# Patient Record
Sex: Male | Born: 1945 | Race: White | Hispanic: No | Marital: Married | Smoking: Never smoker
Health system: Southern US, Community
[De-identification: ages and names within clinical notes are randomized; demographics above are authoritative.]

## PROBLEM LIST (undated history)

## (undated) DIAGNOSIS — Z87442 Personal history of urinary calculi: Secondary | ICD-10-CM

## (undated) DIAGNOSIS — G4700 Insomnia, unspecified: Secondary | ICD-10-CM

## (undated) DIAGNOSIS — I10 Essential (primary) hypertension: Secondary | ICD-10-CM

## (undated) DIAGNOSIS — I779 Disorder of arteries and arterioles, unspecified: Secondary | ICD-10-CM

## (undated) DIAGNOSIS — F32A Depression, unspecified: Secondary | ICD-10-CM

## (undated) DIAGNOSIS — F329 Major depressive disorder, single episode, unspecified: Secondary | ICD-10-CM

## (undated) DIAGNOSIS — H3530 Unspecified macular degeneration: Secondary | ICD-10-CM

## (undated) DIAGNOSIS — Z789 Other specified health status: Secondary | ICD-10-CM

## (undated) DIAGNOSIS — I6789 Other cerebrovascular disease: Secondary | ICD-10-CM

## (undated) DIAGNOSIS — N400 Enlarged prostate without lower urinary tract symptoms: Secondary | ICD-10-CM

## (undated) DIAGNOSIS — H269 Unspecified cataract: Secondary | ICD-10-CM

## (undated) DIAGNOSIS — R490 Dysphonia: Secondary | ICD-10-CM

## (undated) DIAGNOSIS — I700 Atherosclerosis of aorta: Secondary | ICD-10-CM

## (undated) DIAGNOSIS — J383 Other diseases of vocal cords: Secondary | ICD-10-CM

## (undated) DIAGNOSIS — M19071 Primary osteoarthritis, right ankle and foot: Secondary | ICD-10-CM

## (undated) DIAGNOSIS — F419 Anxiety disorder, unspecified: Secondary | ICD-10-CM

## (undated) DIAGNOSIS — K219 Gastro-esophageal reflux disease without esophagitis: Secondary | ICD-10-CM

## (undated) DIAGNOSIS — N200 Calculus of kidney: Secondary | ICD-10-CM

## (undated) DIAGNOSIS — I2510 Atherosclerotic heart disease of native coronary artery without angina pectoris: Secondary | ICD-10-CM

## (undated) DIAGNOSIS — G4730 Sleep apnea, unspecified: Secondary | ICD-10-CM

## (undated) DIAGNOSIS — I4510 Unspecified right bundle-branch block: Secondary | ICD-10-CM

## (undated) DIAGNOSIS — F3181 Bipolar II disorder: Secondary | ICD-10-CM

## (undated) DIAGNOSIS — G629 Polyneuropathy, unspecified: Secondary | ICD-10-CM

## (undated) DIAGNOSIS — K4090 Unilateral inguinal hernia, without obstruction or gangrene, not specified as recurrent: Secondary | ICD-10-CM

## (undated) DIAGNOSIS — G4733 Obstructive sleep apnea (adult) (pediatric): Secondary | ICD-10-CM

## (undated) DIAGNOSIS — M1990 Unspecified osteoarthritis, unspecified site: Secondary | ICD-10-CM

## (undated) DIAGNOSIS — E039 Hypothyroidism, unspecified: Secondary | ICD-10-CM

## (undated) HISTORY — PX: EYE SURGERY: SHX253

## (undated) HISTORY — PX: KNEE SURGERY: SHX244

## (undated) HISTORY — DX: Unspecified macular degeneration: H35.30

## (undated) HISTORY — PX: HERNIA REPAIR: SHX51

## (undated) HISTORY — DX: Polyneuropathy, unspecified: G62.9

## (undated) HISTORY — DX: Unilateral inguinal hernia, without obstruction or gangrene, not specified as recurrent: K40.90

## (undated) HISTORY — DX: Insomnia, unspecified: G47.00

## (undated) HISTORY — PX: HAMMER TOE SURGERY: SHX385

## (undated) HISTORY — DX: Major depressive disorder, single episode, unspecified: F32.9

## (undated) HISTORY — PX: INGUINAL HERNIA REPAIR: SUR1180

## (undated) HISTORY — DX: Primary osteoarthritis, right ankle and foot: M19.071

## (undated) HISTORY — PX: CATARACT EXTRACTION W/ INTRAOCULAR LENS IMPLANT: SHX1309

---

## 2003-12-20 ENCOUNTER — Ambulatory Visit (HOSPITAL_COMMUNITY): Admission: RE | Admit: 2003-12-20 | Discharge: 2003-12-20 | Payer: Self-pay | Admitting: Chiropractic Medicine

## 2004-11-15 ENCOUNTER — Ambulatory Visit: Payer: Self-pay | Admitting: Specialist

## 2005-03-15 ENCOUNTER — Ambulatory Visit: Payer: Self-pay | Admitting: Specialist

## 2006-02-14 ENCOUNTER — Ambulatory Visit (HOSPITAL_BASED_OUTPATIENT_CLINIC_OR_DEPARTMENT_OTHER): Admission: RE | Admit: 2006-02-14 | Discharge: 2006-02-14 | Payer: Self-pay | Admitting: Surgery

## 2006-02-14 HISTORY — PX: OTHER SURGICAL HISTORY: SHX169

## 2006-06-02 ENCOUNTER — Ambulatory Visit (HOSPITAL_COMMUNITY): Admission: RE | Admit: 2006-06-02 | Discharge: 2006-06-02 | Payer: Self-pay | Admitting: Chiropractic Medicine

## 2006-12-22 ENCOUNTER — Ambulatory Visit: Payer: Self-pay | Admitting: Specialist

## 2006-12-26 ENCOUNTER — Ambulatory Visit: Payer: Self-pay | Admitting: Specialist

## 2007-01-15 ENCOUNTER — Other Ambulatory Visit: Payer: Self-pay

## 2007-01-22 ENCOUNTER — Ambulatory Visit: Payer: Self-pay | Admitting: Specialist

## 2007-03-02 ENCOUNTER — Ambulatory Visit: Payer: Self-pay | Admitting: Specialist

## 2008-08-15 ENCOUNTER — Ambulatory Visit: Payer: Self-pay

## 2010-07-12 ENCOUNTER — Ambulatory Visit: Payer: Self-pay | Admitting: Ophthalmology

## 2011-03-28 ENCOUNTER — Other Ambulatory Visit (HOSPITAL_COMMUNITY): Payer: Self-pay | Admitting: *Deleted

## 2011-03-28 ENCOUNTER — Ambulatory Visit (HOSPITAL_COMMUNITY)
Admission: RE | Admit: 2011-03-28 | Discharge: 2011-03-28 | Disposition: A | Discharge status: Home / Self Care | Payer: Medicare Other | Source: Ambulatory Visit | Attending: *Deleted | Admitting: *Deleted

## 2011-03-28 DIAGNOSIS — M542 Cervicalgia: Secondary | ICD-10-CM

## 2011-04-02 ENCOUNTER — Encounter: Payer: Self-pay | Admitting: Family Medicine

## 2011-04-26 ENCOUNTER — Encounter: Payer: Self-pay | Admitting: Family Medicine

## 2011-05-26 ENCOUNTER — Encounter: Payer: Self-pay | Admitting: Family Medicine

## 2011-06-26 ENCOUNTER — Encounter: Payer: Self-pay | Admitting: Family Medicine

## 2012-05-12 ENCOUNTER — Telehealth (INDEPENDENT_AMBULATORY_CARE_PROVIDER_SITE_OTHER): Payer: Self-pay | Admitting: General Surgery

## 2012-05-12 NOTE — Telephone Encounter (Signed)
Called back patient to advise that I spoke to Dr. Daphine Deutscher and advised him of his situation and Dr. Daphine Deutscher agreed to see him this Thursday. Patient has appointment on 05/14/12 at 11:55. No new patient slots seen until July 26th. Patient asked if he can still take anti-inflammatories and ice, I advised that if it is helping to relieve the pain to continue.
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
## 2012-05-14 ENCOUNTER — Ambulatory Visit (INDEPENDENT_AMBULATORY_CARE_PROVIDER_SITE_OTHER): Payer: Medicare Other | Admitting: Surgery

## 2012-05-14 ENCOUNTER — Encounter (INDEPENDENT_AMBULATORY_CARE_PROVIDER_SITE_OTHER): Payer: Self-pay | Admitting: Surgery

## 2012-05-14 VITALS — BP 146/82 | HR 70 | Temp 97.4°F | Resp 14 | Ht 68.0 in | Wt 169.4 lb

## 2012-05-14 DIAGNOSIS — Z9889 Other specified postprocedural states: Secondary | ICD-10-CM

## 2012-05-14 DIAGNOSIS — Z8719 Personal history of other diseases of the digestive system: Secondary | ICD-10-CM | POA: Insufficient documentation

## 2012-05-14 HISTORY — DX: Personal history of other diseases of the digestive system: Z87.19
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
## 2012-05-14 NOTE — Patient Instructions (Addendum)
Thanks for your patience.  If you need further assistance after leaving the office, please call our office and speak with Tracy A.  [phone].  If you want to leave a message for Dr. Aanya Haynes, please call his office phone at [phone]. 

## 2012-07-08 ENCOUNTER — Telehealth (INDEPENDENT_AMBULATORY_CARE_PROVIDER_SITE_OTHER): Payer: Self-pay | Admitting: General Surgery

## 2012-07-08 NOTE — Telephone Encounter (Signed)
Pt called to report to Dr. Daphine Deutscher, that he has had no improvement at all since he was seen on 05/14/12 for burning and tightness on the Rt side of his groin.  He had BIH repair years ago.  He is concerned with lack of improvement.  Updated Dr. Daphine Deutscher; ordered CT of abdomen and pelvis with contrast to evaluate for source of the pain.  Pt understands he will be contacted later with this appt.

## 2012-07-09 ENCOUNTER — Telehealth (INDEPENDENT_AMBULATORY_CARE_PROVIDER_SITE_OTHER): Payer: Self-pay | Admitting: General Surgery

## 2012-07-09 DIAGNOSIS — R109 Unspecified abdominal pain: Secondary | ICD-10-CM

## 2012-07-09 LAB — BASIC METABOLIC PANEL
Calcium: 9.4 mg/dL (ref 8.4–10.5)
Chloride: 108 mEq/L (ref 96–112)
Creat: 0.77 mg/dL (ref 0.50–1.35)

## 2012-07-09 NOTE — Telephone Encounter (Signed)
LMOM to call back for information:  CT scheduled for Monday, 07/13/12, at the [address] location.  He needs to come in to office to pick up contrast and orders for labwork (BMET).

## 2012-07-13 ENCOUNTER — Ambulatory Visit
Admission: RE | Admit: 2012-07-13 | Discharge: 2012-07-13 | Disposition: A | Discharge status: Home / Self Care | Payer: Medicare Other | Source: Ambulatory Visit | Attending: Surgery | Admitting: Surgery

## 2012-07-13 DIAGNOSIS — R109 Unspecified abdominal pain: Secondary | ICD-10-CM

## 2012-07-13 MED ORDER — IOHEXOL 300 MG/ML  SOLN
100.0000 mL | Freq: Once | INTRAMUSCULAR | Status: AC | PRN
  Administered 2012-07-13: 100 mL via INTRAVENOUS

## 2012-07-24 ENCOUNTER — Telehealth (INDEPENDENT_AMBULATORY_CARE_PROVIDER_SITE_OTHER): Payer: Self-pay | Admitting: *Deleted

## 2012-07-24 NOTE — Telephone Encounter (Signed)
Message copied by Barrie Dunker on Fri Jul 24, 2012  9:48 AM ------      Message from: Zacarias Pontes      Created: Thu Jul 16, 2012 12:31 PM       Pt would like test results from cat scan .[name]..[phone]

## 2012-07-24 NOTE — Telephone Encounter (Signed)
Dr Daphine Deutscher spoke with patient re: results on 07/23/12.

## 2012-08-04 ENCOUNTER — Other Ambulatory Visit: Payer: Self-pay | Admitting: Urology

## 2012-08-10 ENCOUNTER — Encounter (HOSPITAL_BASED_OUTPATIENT_CLINIC_OR_DEPARTMENT_OTHER): Payer: Self-pay | Admitting: *Deleted

## 2012-08-10 NOTE — Progress Notes (Signed)
NPO AFTER MN. ARRIVES AT 1000. NEEDS HG AND EKG. WILL TAKE AM MEDS W/ SIPS OF WATER. PT TO BRING COMPLETE LIST OF VITAMINS TO ADD TO MED. LIST. WAS ADVISED TO STOP, IF TAKES, ASA, FISH OIL COQ10, EXTRA VIT E, GINKO, MILK THISTLE.

## 2012-08-12 ENCOUNTER — Encounter (HOSPITAL_BASED_OUTPATIENT_CLINIC_OR_DEPARTMENT_OTHER): Payer: Self-pay | Admitting: Anesthesiology

## 2012-08-12 ENCOUNTER — Encounter (HOSPITAL_BASED_OUTPATIENT_CLINIC_OR_DEPARTMENT_OTHER)
Admission: RE | Disposition: A | Discharge status: Home / Self Care | Payer: Self-pay | Source: Ambulatory Visit | Attending: Urology

## 2012-08-12 ENCOUNTER — Ambulatory Visit (HOSPITAL_BASED_OUTPATIENT_CLINIC_OR_DEPARTMENT_OTHER): Payer: Medicare Other | Admitting: Anesthesiology

## 2012-08-12 ENCOUNTER — Other Ambulatory Visit: Payer: Self-pay

## 2012-08-12 ENCOUNTER — Ambulatory Visit (HOSPITAL_BASED_OUTPATIENT_CLINIC_OR_DEPARTMENT_OTHER)
Admission: RE | Admit: 2012-08-12 | Discharge: 2012-08-12 | Disposition: A | Discharge status: Home / Self Care | Payer: Medicare Other | Source: Ambulatory Visit | Attending: Urology | Admitting: Urology

## 2012-08-12 ENCOUNTER — Encounter (HOSPITAL_BASED_OUTPATIENT_CLINIC_OR_DEPARTMENT_OTHER): Payer: Self-pay | Admitting: *Deleted

## 2012-08-12 DIAGNOSIS — N200 Calculus of kidney: Secondary | ICD-10-CM | POA: Insufficient documentation

## 2012-08-12 HISTORY — DX: Bipolar II disorder: F31.81

## 2012-08-12 HISTORY — PX: URETEROSCOPY: SHX842

## 2012-08-12 HISTORY — DX: Calculus of kidney: N20.0

## 2012-08-12 HISTORY — DX: Unspecified osteoarthritis, unspecified site: M19.90

## 2012-08-12 HISTORY — DX: Hypothyroidism, unspecified: E03.9

## 2012-08-12 LAB — POCT HEMOGLOBIN-HEMACUE: Hemoglobin: 16.3 g/dL (ref 13.0–17.0)

## 2012-08-12 SURGERY — URETEROSCOPY
Anesthesia: General | Site: Ureter | Laterality: Left | Wound class: Clean Contaminated

## 2012-08-12 MED ORDER — OXYCODONE-ACETAMINOPHEN 5-325 MG PO TABS: 1.0000 | ORAL_TABLET | ORAL | Status: DC | PRN

## 2012-08-12 MED ORDER — KETOROLAC TROMETHAMINE 30 MG/ML IJ SOLN
INTRAMUSCULAR | Status: DC | PRN
  Administered 2012-08-12: 15 mg via INTRAVENOUS

## 2012-08-12 MED ORDER — SODIUM CHLORIDE 0.9 % IR SOLN
Status: DC | PRN
  Administered 2012-08-12: 6000 mL via INTRAVESICAL

## 2012-08-12 MED ORDER — FENTANYL CITRATE 0.05 MG/ML IJ SOLN
INTRAMUSCULAR | Status: DC | PRN
  Administered 2012-08-12: 75 ug via INTRAVENOUS
  Administered 2012-08-12: 25 ug via INTRAVENOUS

## 2012-08-12 MED ORDER — LACTATED RINGERS IV SOLN
INTRAVENOUS | Status: DC
  Administered 2012-08-12 (×2): via INTRAVENOUS

## 2012-08-12 MED ORDER — DEXAMETHASONE SODIUM PHOSPHATE 4 MG/ML IJ SOLN
INTRAMUSCULAR | Status: DC | PRN
  Administered 2012-08-12: 10 mg via INTRAVENOUS

## 2012-08-12 MED ORDER — CEFAZOLIN SODIUM-DEXTROSE 2-3 GM-% IV SOLR
2.0000 g | INTRAVENOUS | Status: AC
  Administered 2012-08-12: 2 g via INTRAVENOUS

## 2012-08-12 MED ORDER — CIPROFLOXACIN HCL 250 MG PO TABS: 250.0000 mg | ORAL_TABLET | Freq: Two times a day (BID) | ORAL | Status: DC

## 2012-08-12 MED ORDER — FENTANYL CITRATE 0.05 MG/ML IJ SOLN: 25.0000 ug | INTRAMUSCULAR | Status: DC | PRN

## 2012-08-12 MED ORDER — ONDANSETRON HCL 4 MG/2ML IJ SOLN
INTRAMUSCULAR | Status: DC | PRN
  Administered 2012-08-12: 4 mg via INTRAVENOUS

## 2012-08-12 MED ORDER — IOHEXOL 300 MG/ML  SOLN
INTRAMUSCULAR | Status: DC | PRN
  Administered 2012-08-12: 5 mL

## 2012-08-12 MED ORDER — LACTATED RINGERS IV SOLN
INTRAVENOUS | Status: DC
Start: 2012-08-12 — End: 2012-08-12

## 2012-08-12 MED ORDER — LIDOCAINE HCL (CARDIAC) 20 MG/ML IV SOLN
INTRAVENOUS | Status: DC | PRN
  Administered 2012-08-12: 80 mg via INTRAVENOUS

## 2012-08-12 MED ORDER — PROPOFOL 10 MG/ML IV BOLUS
INTRAVENOUS | Status: DC | PRN
  Administered 2012-08-12: 200 mg via INTRAVENOUS

## 2012-08-12 MED ORDER — TAMSULOSIN HCL 0.4 MG PO CAPS: 0.4000 mg | ORAL_CAPSULE | Freq: Every day | ORAL | Status: DC

## 2012-08-12 SURGICAL SUPPLY — 26 items
ADAPTER CATH URET PLST 4-6FR (CATHETERS) ×3 IMPLANT
ADPR CATH URET STRL DISP 4-6FR (CATHETERS) ×2
BAG URO CATCHER STRL LF (DRAPE) ×3 IMPLANT
BASKET LASER NITINOL 1.9FR (BASKET) ×3 IMPLANT
BASKET ZERO TIP NITINOL 2.4FR (BASKET) IMPLANT
BSKT STON RTRVL 120 1.9FR (BASKET) ×2
BSKT STON RTRVL ZERO TP 2.4FR (BASKET)
CATH FOLEY 2WAY  3CC  8FR (CATHETERS)
CATH FOLEY 2WAY 3CC 8FR (CATHETERS) IMPLANT
CATH INTERMIT  6FR 70CM (CATHETERS) IMPLANT
CLOTH BEACON ORANGE TIMEOUT ST (SAFETY) ×3 IMPLANT
DRAPE CAMERA CLOSED 9X96 (DRAPES) ×3 IMPLANT
GLOVE BIO SURGEON STRL SZ7 (GLOVE) ×3 IMPLANT
GLOVE INDICATOR 6.5 STRL GRN (GLOVE) ×2 IMPLANT
GOWN PREVENTION PLUS XLARGE (GOWN DISPOSABLE) ×3 IMPLANT
GOWN STRL NON-REIN LRG LVL3 (GOWN DISPOSABLE) ×6 IMPLANT
GUIDEWIRE ANG ZIPWIRE 038X150 (WIRE) ×3 IMPLANT
GUIDEWIRE STR DUAL SENSOR (WIRE) ×2 IMPLANT
IV NS IRRIG 3000ML ARTHROMATIC (IV SOLUTION) ×3 IMPLANT
LASER FIBER DISP (UROLOGICAL SUPPLIES) ×2 IMPLANT
PACK CYSTOSCOPY (CUSTOM PROCEDURE TRAY) ×3 IMPLANT
SHEATH ACCESS URETERAL 38CM (SHEATH) ×2 IMPLANT
STENT CONTOUR 6FRX26X.038 (STENTS) IMPLANT
STENT URET 6FRX26 CONTOUR (STENTS) ×2 IMPLANT
SYRINGE 10CC LL (SYRINGE) ×3 IMPLANT
TUBE FEEDING 8FR 16IN STR KANG (MISCELLANEOUS) ×2 IMPLANT

## 2012-08-12 NOTE — H&P (Signed)
Adrian Lewis is an 66 y.o. male.   Chief Complaint: pre-op Left Ureteroscopic Stone Manipulation HPI:  1 - Left Renal Stone - pt longtime stone former with 5mm left lower intrarenal non-obstructing stone found on CT 06/2012 during w/u abd pain. He is going on trip to remote [place] soon and adamantly wants stone removal before his trip if possible. No infectious paramaters   PMH sig for HTN, HLD, Anxiety. No CV disease. No strong blood thinners.   Past Medical History  Diagnosis Date  . Bipolar 2 disorder   . Renal calculus, left   . Hypothyroidism   . Arthritis     Past Surgical History  Procedure Date  . Repair right inguinal hernia w/ mesh 02-14-2006  . Hammer toe surgery 2012  &  2010  (APPROX)    ONE TOE , EACH FOOT  . Cataract extraction w/ intraocular lens implant 2011 (APPROX)    RIGHT EYE AND REPAIR DETACHED RETINA    Family History  Problem Relation Age of Onset  . Heart attack Father    Social History:  reports that he has never smoked. He has never used smokeless tobacco. He reports that he drinks about 8.4 ounces of alcohol per week. He reports that he does not use illicit drugs.  Allergies: No Known Allergies  Medications Prior to Admission  Medication Sig Dispense Refill  . aspirin 81 MG tablet Take 81 mg by mouth daily.      [name] buPROPion (WELLBUTRIN SR) 150 MG 12 hr tablet Take 150 mg by mouth Daily.       . Doxycycline Hyclate 20 MG CAPS Take 1 capsule by mouth 2 (two) times daily.       . fluticasone (FLONASE) 50 MCG/ACT nasal spray as needed.      . lamoTRIgine (LAMICTAL) 100 MG tablet Take 100 mg by mouth daily.       [name] levothyroxine (SYNTHROID, LEVOTHROID) 25 MCG tablet Take 25 mcg by mouth Daily.       [name] lithium 300 MG tablet Take 300 mg by mouth Three times a day.       Alfonso Patten 2 MG TABS Take 2 mg by mouth At bedtime.         No results found for this or any previous visit (from the past 48 hour(s)). No results found.  Review of Systems    Constitutional: Negative.  Negative for fever and chills.  HENT: Negative.   Eyes: Negative.   Respiratory: Negative.   Cardiovascular: Negative.   Gastrointestinal: Negative.   Genitourinary: Negative.  Negative for urgency, hematuria and flank pain.  Musculoskeletal: Negative.   Skin: Negative.   Neurological: Negative.   Endo/Heme/Allergies: Negative.   Psychiatric/Behavioral: Negative.     Blood pressure 145/86, pulse 71, temperature 97.6 F (36.4 C), temperature source Oral, resp. rate 20, height 5\' 8"  (1.727 m), weight 76.346 kg (168 lb 5 oz), SpO2 96.00%. Physical Exam  Constitutional: He is oriented to person, place, and time. He appears well-developed and well-nourished.  HENT:  Head: Normocephalic and atraumatic.  Eyes: Pupils are equal, round, and reactive to light.  Neck: Normal range of motion. Neck supple.  Cardiovascular: Normal rate and regular rhythm.   Respiratory: Effort normal.  GI: Soft. Bowel sounds are normal.       Mild obesity  Genitourinary: Penis normal.       No CVAT  Musculoskeletal: Normal range of motion.  Neurological: He is alert and oriented to person, place, and  time.  Skin: Skin is warm and dry.  Psychiatric: He has a normal mood and affect. His behavior is normal. Judgment and thought content normal.     Assessment/Plan 1 - Left Renal Stone - We have previously discussed management strategies in detail and he wants to proceed with elective left ureteroscopic stone manipulation today. Risks and benefits previously discussed and reinforced again today.  Proceed with surgery.  Nobie Alleyne 08/12/2012, 10:45 AM

## 2012-08-12 NOTE — Anesthesia Postprocedure Evaluation (Signed)
  Anesthesia Post-op Note  Patient: Adrian Lewis  Procedure(s) Performed: Procedure(s) (LRB): URETEROSCOPY (Left) HOLMIUM LASER APPLICATION (Left) CYSTOSCOPY WITH RETROGRADE PYELOGRAM, URETEROSCOPY AND STENT PLACEMENT (Left)  Patient Location: PACU  Anesthesia Type: General  Level of Consciousness: awake and alert   Airway and Oxygen Therapy: Patient Spontanous Breathing  Post-op Pain: mild  Post-op Assessment: Post-op Vital signs reviewed, Patient's Cardiovascular Status Stable, Respiratory Function Stable, Patent Airway and No signs of Nausea or vomiting  Post-op Vital Signs: stable  Complications: No apparent anesthesia complications

## 2012-08-12 NOTE — Brief Op Note (Signed)
08/12/2012  12:14 PM  PATIENT:  Adrian Lewis  66 y.o. male  PRE-OPERATIVE DIAGNOSIS:  LEFT RENAL STONE  POST-OPERATIVE DIAGNOSIS:  LEFT RENAL STONE  PROCEDURE:  Procedure(s) (LRB) with comments: URETEROSCOPY (Left) - 1 HR LEFT URETEROSCOPIC STONE MANIPULATION, LASER AND STENT PLACEMENT NEEDS FLEX URETEROSCOPE HOLMIUM LASER APPLICATION (Left) CYSTOSCOPY WITH RETROGRADE PYELOGRAM, URETEROSCOPY AND STENT PLACEMENT (Left)  SURGEON:  Surgeon(s) and Role:    * Sebastian Ache, MD - Primary  PHYSICIAN ASSISTANT:   ASSISTANTS: none   ANESTHESIA:   general  EBL:  Total I/O In: 100 [I.V.:100] Out: -   BLOOD ADMINISTERED:none  DRAINS: none   LOCAL MEDICATIONS USED:  NONE  SPECIMEN:  Source of Specimen:  left kidney - stone  DISPOSITION OF SPECIMEN:  [facility] for Compositional analysis  COUNTS:  YES  TOURNIQUET:  * No tourniquets in log *  DICTATION: .Other Dictation: Dictation Number [id number]  PLAN OF CARE: Discharge to home after PACU  PATIENT DISPOSITION:  PACU - hemodynamically stable.   Delay start of Pharmacological VTE agent (>24hrs) due to surgical blood loss or risk of bleeding: not applicable

## 2012-08-12 NOTE — Anesthesia Preprocedure Evaluation (Signed)
Anesthesia Evaluation  Patient identified by MRN, date of birth, ID band Patient awake    Reviewed: Allergy & Precautions, H&P , NPO status , Patient's Chart, lab work & pertinent test results  Airway Mallampati: II TM Distance: >3 FB Neck ROM: full    Dental No notable dental hx. (+) Teeth Intact and Dental Advisory Given   Pulmonary neg pulmonary ROS,  breath sounds clear to auscultation  Pulmonary exam normal       Cardiovascular Exercise Tolerance: Good negative cardio ROS  Rhythm:regular Rate:Normal     Neuro/Psych Bipolar Disorder negative neurological ROS  negative psych ROS   GI/Hepatic negative GI ROS, Neg liver ROS,   Endo/Other  negative endocrine ROSHypothyroidism   Renal/GU negative Renal ROS  negative genitourinary   Musculoskeletal   Abdominal   Peds  Hematology negative hematology ROS (+)   Anesthesia Other Findings   Reproductive/Obstetrics negative OB ROS                           Anesthesia Physical Anesthesia Plan  ASA: II  Anesthesia Plan: General   Post-op Pain Management:    Induction: Intravenous  Airway Management Planned: LMA  Additional Equipment:   Intra-op Plan:   Post-operative Plan:   Informed Consent: I have reviewed the patients History and Physical, chart, labs and discussed the procedure including the risks, benefits and alternatives for the proposed anesthesia with the patient or authorized representative who has indicated his/her understanding and acceptance.   Dental Advisory Given  Plan Discussed with: CRNA and Surgeon  Anesthesia Plan Comments:         Anesthesia Quick Evaluation

## 2012-08-12 NOTE — Transfer of Care (Signed)
Immediate Anesthesia Transfer of Care Note  Patient: Adrian Lewis  Procedure(s) Performed: Procedure(s) (LRB) with comments: URETEROSCOPY (Left) - 1 HR LEFT URETEROSCOPIC STONE MANIPULATION, LASER AND STENT PLACEMENT NEEDS FLEX URETEROSCOPE HOLMIUM LASER APPLICATION (Left) CYSTOSCOPY WITH RETROGRADE PYELOGRAM, URETEROSCOPY AND STENT PLACEMENT (Left)  Patient Location: PACU  Anesthesia Type: General  Level of Consciousness: sedated and responds to stimulation  Airway & Oxygen Therapy: Patient Spontanous Breathing and Patient connected to nasal cannula oxygen  Post-op Assessment: Report given to PACU RN  Post vital signs: Reviewed and stable  Complications: No apparent anesthesia complications

## 2012-08-12 NOTE — Anesthesia Procedure Notes (Signed)
Procedure Name: LMA Insertion Date/Time: 08/12/2012 11:20 AM Performed by: Maris Berger T Pre-anesthesia Checklist: Patient identified, Emergency Drugs available, Suction available and Patient being monitored Patient Re-evaluated:Patient Re-evaluated prior to inductionOxygen Delivery Method: Circle System Utilized Preoxygenation: Pre-oxygenation with 100% oxygen Intubation Type: IV induction Ventilation: Mask ventilation without difficulty LMA: LMA inserted LMA Size: 5.0 Number of attempts: 1 Placement Confirmation: positive ETCO2 Dental Injury: Teeth and Oropharynx as per pre-operative assessment  Comments: Gauze roll between teeth

## 2012-08-13 ENCOUNTER — Encounter (HOSPITAL_BASED_OUTPATIENT_CLINIC_OR_DEPARTMENT_OTHER): Payer: Self-pay | Admitting: Urology

## 2012-08-13 NOTE — Op Note (Signed)
NAMEKEEAN, Adrian Lewis NO.:  [id number]  MEDICAL RECORD NO.:  [id number]  LOCATION:                               FACILITY:  [facility]  PHYSICIAN:  Sebastian Ache, MD     DATE OF BIRTH:  [birth year]  DATE OF PROCEDURE:  08/12/2012 DATE OF DISCHARGE:                              OPERATIVE REPORT   DIAGNOSES:  Left renal stone.  PROCEDURE: 1. Cystoscopy with left retrograde pyelogram and interpretation. 2. Left ureteroscopic stone manipulation with laser lithotripsy. 3. Insertion of left ureteral stent 6 x 26 with tether.  ESTIMATED BLOOD LOSS:  Nil.  SPECIMEN:  Left renal stone for composition analysis.  DRAINS:  None.  FINDINGS: 1. Solitary left lower pole renal stone consistent with known stone on     x-ray. 2. Otherwise unremarkable left retro-pyelogram. 3. Unremarkable urinary bladder.  INDICATION:  Mr. Bee is a pleasant 66 year old gentleman with long history nephrolithiasis, at least 3 prior stone episodes.  He had a CT scan performed last month, remarkable abdominal pain, where he was found to have a left intrarenal stone that was nonobstructing.  He is going on a trip to remote [place] very soon and he adamantly wants a stone treated definitively before this vacation.  Options were discussed including lithotripsy versus observation versus ureteroscopic stone manipulation and wished to proceed with the latter.  Informed consent was obtained and placed in medical record.  PROCEDURE IN DETAIL:  The patient being Ozair Ponting, procedure being left ureteroscopic stone manipulation was confirmed.  The procedure was carried out.  Time-out was performed.  Intravenous antibiotics administered.  General LMA anesthesia was introduced.  The patient was placed into a low lithotomy position and sterile field was created by prepping the patient's penis, perineum, and proximal thighs using iodine x3.  Next cystourethroscopy was performed using a 22-French  rigid cystoscope with 12 degree lens.  Inspection of the anterior and posterior urethra was unremarkable.  There was mild bilobar prostatic hypertrophy.  Inspection of the urinary bladder revealed no diverticula, calcifications, papular lesions.  There were bilateral single ureteral orifices.  Normal anatomic position.  The left ureteral orifice was gently cannulated using a 6-French open-ended catheter and left retro pyelogram seen.  Left retro pyelogram demonstrated a single left ureter single system of kidney without filling defects or narrowing.  There was a single radiodensity corresponding to likely stone, lower pole of left kidney. A 0.038 zip wire was advanced over the pole and set aside as a safety wire.  The cystoscope was then exchanged for the 6.5-French semi-rigid ureteroscope using normal saline under pressure and alongside an 8- French pressure release catheter in urinary bladder.  Semi-rigid ureteroscopy was performed of the entire length of the left ureter.  No mucosal abnormalities or calcifications were noted.  A separate 0.038 Sensor wire was advanced to set aside as a working wire over which a new 12/14 ureteral access sheath was placed at the level of the proximal ureter under continuous fluoroscopy.  Next, a flexible ureteroscopy was performed using 8-French digital ureteroscope and inspection of rest of the left urothelium including all calices of the left kidney was performed.  Indeed a single  left calcification was noted in the lower pole.  This appeared to be too large for simple basketing and as such holmium laser energy was applied to it using a 200 nanometer fiber at settings of 0.5 joules and 10 Hz.  The stone was broken into approximately 6 smaller pieces, which was then grasped sequentially with an escape type basket and brought on entirety and set aside for composition analysis.  Repeat flexible ureteroscopy of each calix was entire length of the  ureter.  Withdrawal of the access sheath revealed no additional calcifications or mucosal abnormalities.  Finally a new 6 x 26 double-J stent was placed using cystoscopic and fluoroscopic guidance.  Good proximal and distal curl were noted.  Bladder was emptied per cystoscope.  The tether was fashioned to the dorsum of the penis and the procedure terminated.  The patient tolerated the procedure well.  There were no immediate periprocedural complications.  The patient was taken to the postanesthesia care unit in stable condition.          ______________________________ Sebastian Ache, MD     TM/MEDQ  D:  08/12/2012  T:  08/13/2012  Job:  [id number]

## 2012-08-18 ENCOUNTER — Encounter (HOSPITAL_COMMUNITY): Payer: Self-pay

## 2012-11-25 HISTORY — PX: JOINT REPLACEMENT: SHX530

## 2012-11-25 HISTORY — PX: PARTIAL KNEE ARTHROPLASTY: SHX2174

## 2013-01-09 ENCOUNTER — Other Ambulatory Visit: Payer: Self-pay

## 2013-02-24 DIAGNOSIS — M5410 Radiculopathy, site unspecified: Secondary | ICD-10-CM

## 2013-02-24 DIAGNOSIS — M549 Dorsalgia, unspecified: Secondary | ICD-10-CM | POA: Insufficient documentation

## 2013-02-24 HISTORY — DX: Radiculopathy, site unspecified: M54.10

## 2013-03-07 ENCOUNTER — Emergency Department: Payer: Self-pay | Admitting: Internal Medicine

## 2013-03-07 LAB — BASIC METABOLIC PANEL
Anion Gap: 4 — ABNORMAL LOW (ref 7–16)
BUN: 14 mg/dL (ref 7–18)
Co2: 28 mmol/L (ref 21–32)
Osmolality: 285 (ref 275–301)
Potassium: 4.4 mmol/L (ref 3.5–5.1)

## 2013-03-07 LAB — CBC WITH DIFFERENTIAL/PLATELET
Basophil #: 0.1 10*3/uL (ref 0.0–0.1)
Basophil %: 1.1 %
Eosinophil %: 2.9 %
HCT: 44.4 % (ref 40.0–52.0)
HGB: 14.6 g/dL (ref 13.0–18.0)
Lymphocyte #: 2 10*3/uL (ref 1.0–3.6)
Lymphocyte %: 18.4 %
MCHC: 32.9 g/dL (ref 32.0–36.0)
MCV: 95 fL (ref 80–100)
Monocyte #: 1.2 x10 3/mm — ABNORMAL HIGH (ref 0.2–1.0)
Monocyte %: 10.7 %
Neutrophil #: 7.3 10*3/uL — ABNORMAL HIGH (ref 1.4–6.5)
Neutrophil %: 66.9 %
RBC: 4.65 10*6/uL (ref 4.40–5.90)
RDW: 13.4 % (ref 11.5–14.5)
WBC: 10.9 10*3/uL — ABNORMAL HIGH (ref 3.8–10.6)

## 2013-03-07 LAB — PROTIME-INR: INR: 1

## 2013-04-05 DIAGNOSIS — Z9889 Other specified postprocedural states: Secondary | ICD-10-CM | POA: Insufficient documentation

## 2013-04-20 ENCOUNTER — Encounter: Payer: Self-pay | Admitting: Orthopedic Surgery

## 2013-04-25 ENCOUNTER — Encounter: Payer: Self-pay | Admitting: Orthopedic Surgery

## 2013-04-27 ENCOUNTER — Ambulatory Visit: Payer: Self-pay | Admitting: Ophthalmology

## 2013-05-25 ENCOUNTER — Encounter: Payer: Self-pay | Admitting: Orthopedic Surgery

## 2013-06-25 ENCOUNTER — Encounter: Payer: Self-pay | Admitting: Orthopedic Surgery

## 2013-06-30 ENCOUNTER — Other Ambulatory Visit: Payer: Self-pay

## 2013-07-26 ENCOUNTER — Encounter: Payer: Self-pay | Admitting: Orthopedic Surgery

## 2013-09-30 ENCOUNTER — Other Ambulatory Visit: Payer: Self-pay

## 2013-10-07 DIAGNOSIS — G4733 Obstructive sleep apnea (adult) (pediatric): Secondary | ICD-10-CM | POA: Insufficient documentation

## 2013-10-07 DIAGNOSIS — M5431 Sciatica, right side: Secondary | ICD-10-CM

## 2013-10-07 DIAGNOSIS — E039 Hypothyroidism, unspecified: Secondary | ICD-10-CM | POA: Insufficient documentation

## 2013-10-07 DIAGNOSIS — F3181 Bipolar II disorder: Secondary | ICD-10-CM | POA: Insufficient documentation

## 2013-10-07 HISTORY — DX: Sciatica, right side: M54.31

## 2013-10-07 HISTORY — DX: Obstructive sleep apnea (adult) (pediatric): G47.33

## 2013-10-08 DIAGNOSIS — E871 Hypo-osmolality and hyponatremia: Secondary | ICD-10-CM | POA: Insufficient documentation

## 2013-10-08 HISTORY — DX: Hypo-osmolality and hyponatremia: E87.1

## 2013-10-22 ENCOUNTER — Emergency Department: Payer: Self-pay | Admitting: Emergency Medicine

## 2013-10-22 LAB — CBC
HCT: 39.8 % — ABNORMAL LOW (ref 40.0–52.0)
MCH: 31.9 pg (ref 26.0–34.0)
MCV: 95 fL (ref 80–100)
Platelet: 307 10*3/uL (ref 150–440)
RBC: 4.21 10*6/uL — ABNORMAL LOW (ref 4.40–5.90)
RDW: 12.6 % (ref 11.5–14.5)

## 2013-10-22 LAB — BASIC METABOLIC PANEL
BUN: 18 mg/dL (ref 7–18)
Chloride: 104 mmol/L (ref 98–107)
Co2: 30 mmol/L (ref 21–32)
Glucose: 116 mg/dL — ABNORMAL HIGH (ref 65–99)
Osmolality: 275 (ref 275–301)
Potassium: 4.1 mmol/L (ref 3.5–5.1)
Sodium: 136 mmol/L (ref 136–145)

## 2013-11-01 ENCOUNTER — Encounter: Payer: Self-pay | Admitting: Orthopedic Surgery

## 2013-11-10 ENCOUNTER — Encounter: Payer: Self-pay | Admitting: *Deleted

## 2013-11-11 ENCOUNTER — Ambulatory Visit: Payer: Medicare Other | Admitting: Podiatry

## 2013-11-11 ENCOUNTER — Encounter: Payer: Self-pay | Admitting: Podiatry

## 2013-11-11 ENCOUNTER — Ambulatory Visit (INDEPENDENT_AMBULATORY_CARE_PROVIDER_SITE_OTHER): Payer: Medicare Other

## 2013-11-11 VITALS — BP 137/96 | HR 113 | Resp 16 | Ht 68.0 in | Wt 165.0 lb

## 2013-11-11 DIAGNOSIS — M79609 Pain in unspecified limb: Secondary | ICD-10-CM

## 2013-11-11 DIAGNOSIS — L84 Corns and callosities: Secondary | ICD-10-CM

## 2013-11-11 DIAGNOSIS — M79671 Pain in right foot: Secondary | ICD-10-CM

## 2013-11-11 DIAGNOSIS — M2040 Other hammer toe(s) (acquired), unspecified foot: Secondary | ICD-10-CM

## 2013-11-11 NOTE — Progress Notes (Signed)
Adrian Lewis presents today for a chief complaint of painful fourth and fifth digits of the right foot. He states that they rub and make a sore spot. I have reviewed his past medical history medications and allergies.  Objective: Pulses remain palpable. He has overlapping second digit over the hallux right. With me with medial deviation of the toes. This medial deviation of the toes associated with long toenails is more than likely resulting in his symptomatology between the fourth and fifth digits of the right foot. Examination today does not demonstrate any lesion between the fourth and fifth toes of the right foot. No erythema edema saline is drainage or odor. Radiographically evaluation does confirm the abduction of the toes toward the hallux. No other osseous abnormalities noted.  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 Second digit right foot.  Plan: Toe spacer between the fourth and fifth toes of the right foot as well as a new digital night splint.

## 2013-11-30 ENCOUNTER — Encounter: Payer: Self-pay | Admitting: Orthopedic Surgery

## 2013-12-09 ENCOUNTER — Encounter: Payer: Self-pay | Admitting: Family Medicine

## 2013-12-26 ENCOUNTER — Encounter: Payer: Self-pay | Admitting: Family Medicine

## 2013-12-26 ENCOUNTER — Encounter: Payer: Self-pay | Admitting: Orthopedic Surgery

## 2014-01-23 ENCOUNTER — Encounter: Payer: Self-pay | Admitting: Family Medicine

## 2014-04-04 DIAGNOSIS — M8580 Other specified disorders of bone density and structure, unspecified site: Secondary | ICD-10-CM

## 2014-04-04 HISTORY — DX: Other specified disorders of bone density and structure, unspecified site: M85.80

## 2014-09-09 ENCOUNTER — Other Ambulatory Visit: Payer: Self-pay

## 2015-03-13 ENCOUNTER — Ambulatory Visit (INDEPENDENT_AMBULATORY_CARE_PROVIDER_SITE_OTHER): Payer: Medicare Other | Admitting: Podiatry

## 2015-03-13 ENCOUNTER — Encounter: Payer: Self-pay | Admitting: Podiatry

## 2015-03-13 ENCOUNTER — Ambulatory Visit (INDEPENDENT_AMBULATORY_CARE_PROVIDER_SITE_OTHER): Payer: Medicare Other

## 2015-03-13 VITALS — BP 141/84 | HR 87 | Resp 16 | Ht 68.0 in | Wt 170.0 lb

## 2015-03-13 DIAGNOSIS — M79671 Pain in right foot: Secondary | ICD-10-CM

## 2015-03-13 DIAGNOSIS — M722 Plantar fascial fibromatosis: Secondary | ICD-10-CM | POA: Diagnosis not present

## 2015-03-13 NOTE — Progress Notes (Signed)
He presents today for follow-up of his right foot. He states that I have generalized right foot pain. He could possibly be some associated with the fact that I have about a new para shoes and several months. I try to wear my orthotics and all of my shoes including my Garden shoes.  Objective: Pulses are strongly palpable bilateral. He has hammertoe deformity needle deviation of the second toe and pain on palpation medial calcaneal tubercle of the right heel. Radiographs confirm postsurgical changes of the forefoot. As well as a soft tissue increase in density at the plantar fascial calcaneal insertion site.  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  Plan: Discussed appropriate shoe gear stretching exercises ice therapy and the use of the orthotics. Follow up with him in the near future for another set of orthotics should this not resolve his problem.

## 2015-05-22 ENCOUNTER — Other Ambulatory Visit: Payer: Self-pay

## 2015-06-19 ENCOUNTER — Ambulatory Visit: Payer: Medicare Other | Admitting: Podiatry

## 2015-09-10 ENCOUNTER — Emergency Department: Payer: Medicare Other

## 2015-09-10 ENCOUNTER — Emergency Department
Admission: EM | Admit: 2015-09-10 | Discharge: 2015-09-10 | Disposition: A | Discharge status: Home / Self Care | Payer: Medicare Other | Attending: Emergency Medicine | Admitting: Emergency Medicine

## 2015-09-10 ENCOUNTER — Encounter: Payer: Self-pay | Admitting: Emergency Medicine

## 2015-09-10 DIAGNOSIS — S0181XA Laceration without foreign body of other part of head, initial encounter: Secondary | ICD-10-CM | POA: Diagnosis not present

## 2015-09-10 DIAGNOSIS — Y9389 Activity, other specified: Secondary | ICD-10-CM | POA: Diagnosis not present

## 2015-09-10 DIAGNOSIS — S060X0A Concussion without loss of consciousness, initial encounter: Secondary | ICD-10-CM | POA: Diagnosis not present

## 2015-09-10 DIAGNOSIS — Y998 Other external cause status: Secondary | ICD-10-CM | POA: Diagnosis not present

## 2015-09-10 DIAGNOSIS — Z792 Long term (current) use of antibiotics: Secondary | ICD-10-CM | POA: Diagnosis not present

## 2015-09-10 DIAGNOSIS — W1839XA Other fall on same level, initial encounter: Secondary | ICD-10-CM | POA: Insufficient documentation

## 2015-09-10 DIAGNOSIS — Z23 Encounter for immunization: Secondary | ICD-10-CM | POA: Insufficient documentation

## 2015-09-10 DIAGNOSIS — K4091 Unilateral inguinal hernia, without obstruction or gangrene, recurrent: Secondary | ICD-10-CM | POA: Diagnosis not present

## 2015-09-10 DIAGNOSIS — F419 Anxiety disorder, unspecified: Secondary | ICD-10-CM | POA: Insufficient documentation

## 2015-09-10 DIAGNOSIS — Z79899 Other long term (current) drug therapy: Secondary | ICD-10-CM | POA: Insufficient documentation

## 2015-09-10 DIAGNOSIS — Z7982 Long term (current) use of aspirin: Secondary | ICD-10-CM | POA: Diagnosis not present

## 2015-09-10 DIAGNOSIS — Y9289 Other specified places as the place of occurrence of the external cause: Secondary | ICD-10-CM | POA: Insufficient documentation

## 2015-09-10 MED ORDER — TETANUS-DIPHTH-ACELL PERTUSSIS 5-2.5-18.5 LF-MCG/0.5 IM SUSP
0.5000 mL | Freq: Once | INTRAMUSCULAR | Status: AC
  Administered 2015-09-10: 0.5 mL via INTRAMUSCULAR

## 2015-09-10 MED ORDER — TETANUS-DIPHTH-ACELL PERTUSSIS 5-2.5-18.5 LF-MCG/0.5 IM SUSP
INTRAMUSCULAR | Status: AC
  Filled 2015-09-10: qty 0.5
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
## 2015-09-10 NOTE — Discharge Instructions (Signed)
You were evaluated after head injury and treated for facial laceration. I suspect he may have had a minor concussion with symptoms of headache, nausea, and dizziness. The symptoms of concussion may last from several days to several weeks. He should follow up with your primary care physician to ensure symptoms of a concussion have resolved.  Steri-Strips were placed to facial laceration. These will fall off on their own in about 7-10 days. You may bathe normally.  You were given to tDap (tetanus) booster shot.   This is good for 5 years.  Return to the emergency department for any worsening condition including confusion, altered mental status, worsening headache, or for any signs of infection from the wound including new pain, redness, swelling, or drainage.  We discussed your right groin discomfort, and he will follow up with your urologist as scheduled to have a recheck of the inguinal hernia. You may also follow up with your surgeon for evaluation. If the pain in the groin becomes more severe, you should return to the emergency department for evaluation.   Facial Laceration  A facial laceration is a cut on the face. These injuries can be painful and cause bleeding. Lacerations usually heal quickly, but they need special care to reduce scarring. DIAGNOSIS  Your health care provider will take a medical history, ask for details about how the injury occurred, and examine the wound to determine how deep the cut is. TREATMENT  Some facial lacerations may not require closure. Others may not be able to be closed because of an increased risk of infection. The risk of infection and the chance for successful closure will depend on various factors, including the amount of time since the injury occurred. The wound may be cleaned to help prevent infection. If closure is appropriate, pain medicines may be given if needed. Your health care provider will use stitches (sutures), wound glue (adhesive), or skin  adhesive strips to repair the laceration. These tools bring the skin edges together to allow for faster healing and a better cosmetic outcome. If needed, you may also be given a tetanus shot. HOME CARE INSTRUCTIONS  Only take over-the-counter or prescription medicines as directed by your health care provider.  Follow your health care provider's instructions for wound care. These instructions will vary depending on the technique used for closing the wound. For Sutures:  Keep the wound clean and dry.   If you were given a bandage (dressing), you should change it at least once a day. Also change the dressing if it becomes wet or dirty, or as directed by your health care provider.   Wash the wound with soap and water 2 times a day. Rinse the wound off with water to remove all soap. Pat the wound dry with a clean towel.   After cleaning, apply a thin layer of the antibiotic ointment recommended by your health care provider. This will help prevent infection and keep the dressing from sticking.   You may shower as usual after the first 24 hours. Do not soak the wound in water until the sutures are removed.   Get your sutures removed as directed by your health care provider. With facial lacerations, sutures should usually be taken out after 4-5 days to avoid stitch marks.   Wait a few days after your sutures are removed before applying any makeup. For Skin Adhesive Strips:  Keep the wound clean and dry.   Do not get the skin adhesive strips wet. You may bathe carefully, using caution  to keep the wound dry.   If the wound gets wet, pat it dry with a clean towel.   Skin adhesive strips will fall off on their own. You may trim the strips as the wound heals. Do not remove skin adhesive strips that are still stuck to the wound. They will fall off in time.  For Wound Adhesive:  You may briefly wet your wound in the shower or bath. Do not soak or scrub the wound. Do not swim. Avoid periods  of heavy sweating until the skin adhesive has fallen off on its own. After showering or bathing, gently pat the wound dry with a clean towel.   Do not apply liquid medicine, cream medicine, ointment medicine, or makeup to your wound while the skin adhesive is in place. This may loosen the film before your wound is healed.   If a dressing is placed over the wound, be careful not to apply tape directly over the skin adhesive. This may cause the adhesive to be pulled off before the wound is healed.   Avoid prolonged exposure to sunlight or tanning lamps while the skin adhesive is in place.  The skin adhesive will usually remain in place for 5-10 days, then naturally fall off the skin. Do not pick at the adhesive film.  After Healing: Once the wound has healed, cover the wound with sunscreen during the day for 1 full year. This can help minimize scarring. Exposure to ultraviolet light in the first year will darken the scar. It can take 1-2 years for the scar to lose its redness and to heal completely.  SEEK MEDICAL CARE IF:  You have a fever. SEEK IMMEDIATE MEDICAL CARE IF:  You have redness, pain, or swelling around the wound.   You see ayellowish-white fluid (pus) coming from the wound.    This information is not intended to replace advice given to you by your health care provider. Make sure you discuss any questions you have with your health care provider.   Document Released: 12/19/2004 Document Revised: 12/02/2014 Document Reviewed: 06/24/2013 Elsevier Interactive Patient Education 2016 [employer]  Head Injury, Adult You have received a head injury. It does not appear serious at this time. Headaches and vomiting are common following head injury. It should be easy to awaken from sleeping. Sometimes it is necessary for you to stay in the emergency department for a while for observation. Sometimes admission to the hospital may be needed. After injuries such as yours, most problems  occur within the first 24 hours, but side effects may occur up to 7-10 days after the injury. It is important for you to carefully monitor your condition and contact your health care provider or seek immediate medical care if there is a change in your condition. WHAT ARE THE TYPES OF HEAD INJURIES? Head injuries can be as minor as a bump. Some head injuries can be more severe. More severe head injuries include:  A jarring injury to the brain (concussion).  A bruise of the brain (contusion). This mean there is bleeding in the brain that can cause swelling.  A cracked skull (skull fracture).  Bleeding in the brain that collects, clots, and forms a bump (hematoma). WHAT CAUSES A HEAD INJURY? A serious head injury is most likely to happen to someone who is in a car wreck and is not wearing a seat belt. Other causes of major head injuries include bicycle or motorcycle accidents, sports injuries, and falls. HOW ARE HEAD INJURIES DIAGNOSED? A  complete history of the event leading to the injury and your current symptoms will be helpful in diagnosing head injuries. Many times, pictures of the brain, such as CT or MRI are needed to see the extent of the injury. Often, an overnight hospital stay is necessary for observation.  WHEN SHOULD I SEEK IMMEDIATE MEDICAL CARE?  You should get help right away if:  You have confusion or drowsiness.  You feel sick to your stomach (nauseous) or have continued, forceful vomiting.  You have dizziness or unsteadiness that is getting worse.  You have severe, continued headaches not relieved by medicine. Only take over-the-counter or prescription medicines for pain, fever, or discomfort as directed by your health care provider.  You do not have normal function of the arms or legs or are unable to walk.  You notice changes in the black spots in the center of the colored part of your eye (pupil).  You have a clear or bloody fluid coming from your nose or ears.  You  have a loss of vision. During the next 24 hours after the injury, you must stay with someone who can watch you for the warning signs. This person should contact local emergency services (911 in the [place] if you have seizures, you become unconscious, or you are unable to wake up. HOW CAN I PREVENT A HEAD INJURY IN THE FUTURE? The most important factor for preventing major head injuries is avoiding motor vehicle accidents. To minimize the potential for damage to your head, it is crucial to wear seat belts while riding in motor vehicles. Wearing helmets while bike riding and playing collision sports (like football) is also helpful. Also, avoiding dangerous activities around the house will further help reduce your risk of head injury.  WHEN CAN I RETURN TO NORMAL ACTIVITIES AND ATHLETICS? You should be reevaluated by your health care provider before returning to these activities. If you have any of the following symptoms, you should not return to activities or contact sports until 1 week after the symptoms have stopped:  Persistent headache.  Dizziness or vertigo.  Poor attention and concentration.  Confusion.  Memory problems.  Nausea or vomiting.  Fatigue or tire easily.  Irritability.  Intolerant of bright lights or loud noises.  Anxiety or depression.  Disturbed sleep. MAKE SURE YOU:   Understand these instructions.  Will watch your condition.  Will get help right away if you are not doing well or get worse.   This information is not intended to replace advice given to you by your health care provider. Make sure you discuss any questions you have with your health care provider.   Document Released: 11/11/2005 Document Revised: 12/02/2014 Document Reviewed: 07/19/2013 Elsevier Interactive Patient Education 2016 [employer].  Concussion, Adult A concussion is a brain injury. It is caused by:  A hit to the head.  A quick and sudden movement (jolt) of the head or neck. A  concussion is usually not life threatening. Even so, it can cause serious problems. If you had a concussion before, you may have concussion-like problems after a hit to your head. HOME CARE General Instructions  Follow your doctor's directions carefully.  Take medicines only as told by your doctor.  Only take medicines your doctor says are safe.  Do not drink alcohol until your doctor says it is okay. Alcohol and some drugs can slow down healing. They can also put you at risk for further injury.  If you are having trouble remembering things, write  them down.  Try to do one thing at a time if you get distracted easily. For example, do not watch TV while making dinner.  Talk to your family members or close friends when making important decisions.  Follow up with your doctor as told.  Watch your symptoms. Tell others to do the same. Serious problems can sometimes happen after a concussion. Older adults are more likely to have these problems.  Tell your teachers, school nurse, school counselor, coach, Event organiserathletic trainer, or work Production designer, theatre/television/filmmanager about your concussion. Tell them about what you can or cannot do. They should watch to see if:  It gets even harder for you to pay attention or concentrate.  It gets even harder for you to remember things or learn new things.  You need more time than normal to finish things.  You become annoyed (irritable) more than before.  You are not able to deal with stress as well.  You have more problems than before.  Rest. Make sure you:  Get plenty of sleep at night.  Go to sleep early.  Go to bed at the same time every day. Try to wake up at the same time.  Rest during the day.  Take naps when you feel tired.  Limit activities where you have to think a lot or concentrate. These include:  Doing homework.  Doing work related to a job.  Watching TV.  Using the computer. Returning To Your Regular Activities Return to your normal activities  slowly, not all at once. You must give your body and brain enough time to heal.   Do not play sports or do other athletic activities until your doctor says it is okay.  Ask your doctor when you can drive, ride a bicycle, or work other vehicles or machines. Never do these things if you feel dizzy.  Ask your doctor about when you can return to work or school. Preventing Another Concussion It is very important to avoid another brain injury, especially before you have healed. In rare cases, another injury can lead to permanent brain damage, brain swelling, or death. The risk of this is greatest during the first 7-10 days after your injury. Avoid injuries by:   Wearing a seat belt when riding in a car.  Not drinking too much alcohol.  Avoiding activities that could lead to a second concussion (such as contact sports).  Wearing a helmet when doing activities like:  Biking.  Skiing.  Skateboarding.  Skating.  Making your home safer by:  Removing things from the floor or stairways that could make you trip.  Using grab bars in bathrooms and handrails by stairs.  Placing non-slip mats on floors and in bathtubs.  Improve lighting in dark areas. GET HELP IF:  It gets even harder for you to pay attention or concentrate.  It gets even harder for you to remember things or learn new things.  You need more time than normal to finish things.  You become annoyed (irritable) more than before.  You are not able to deal with stress as well.  You have more problems than before.  You have problems keeping your balance.  You are not able to react quickly when you should. Get help if you have any of these problems for more than 2 weeks:   Lasting (chronic) headaches.  Dizziness or trouble balancing.  Feeling sick to your stomach (nausea).  Seeing (vision) problems.  Being affected by noises or light more than normal.  Feeling sad, low,  down in the dumps, blue, gloomy, or empty  (depressed).  Mood changes (mood swings).  Feeling of fear or nervousness about what may happen (anxiety).  Feeling annoyed.  Memory problems.  Problems concentrating or paying attention.  Sleep problems.  Feeling tired all the time. GET HELP RIGHT AWAY IF:   You have bad headaches or your headaches get worse.  You have weakness (even if it is in one hand, leg, or part of the face).  You have loss of feeling (numbness).  You feel off balance.  You keep throwing up (vomiting).  You feel tired.  One black center of your eye (pupil) is larger than the other.  You twitch or shake violently (convulse).  Your speech is not clear (slurred).  You are more confused, easily angered (agitated), or annoyed than before.  You have more trouble resting than before.  You are unable to recognize people or places.  You have neck pain.  It is difficult to wake you up.  You have unusual behavior changes.  You pass out (lose consciousness). MAKE SURE YOU:   Understand these instructions.  Will watch your condition.  Will get help right away if you are not doing well or get worse.   This information is not intended to replace advice given to you by your health care provider. Make sure you discuss any questions you have with your health care provider.   Document Released: 10/30/2009 Document Revised: 12/02/2014 Document Reviewed: 06/03/2013 Elsevier Interactive Patient Education [employer]

## 2015-09-19 ENCOUNTER — Encounter
Admission: RE | Admit: 2015-09-19 | Discharge: 2015-09-19 | Disposition: A | Discharge status: Home / Self Care | Payer: Medicare Other | Source: Ambulatory Visit | Attending: Otolaryngology | Admitting: Otolaryngology

## 2015-09-19 DIAGNOSIS — J342 Deviated nasal septum: Secondary | ICD-10-CM | POA: Diagnosis not present

## 2015-09-19 DIAGNOSIS — Z01818 Encounter for other preprocedural examination: Secondary | ICD-10-CM | POA: Insufficient documentation

## 2015-09-19 DIAGNOSIS — J343 Hypertrophy of nasal turbinates: Secondary | ICD-10-CM | POA: Insufficient documentation

## 2015-09-19 HISTORY — DX: Anxiety disorder, unspecified: F41.9

## 2015-09-19 HISTORY — DX: Sleep apnea, unspecified: G47.30

## 2015-09-19 HISTORY — DX: Major depressive disorder, single episode, unspecified: F32.9

## 2015-09-19 HISTORY — DX: Depression, unspecified: F32.A

## 2015-09-19 HISTORY — DX: Unspecified macular degeneration: H35.30

## 2015-09-19 LAB — DIFFERENTIAL
BASOS ABS: 0.1 10*3/uL (ref 0–0.1)
Basophils Relative: 1 %
EOS ABS: 0.4 10*3/uL (ref 0–0.7)
Eosinophils Relative: 5 %
LYMPHS ABS: 2 10*3/uL (ref 1.0–3.6)
Lymphocytes Relative: 26 %
Monocytes Absolute: 0.9 10*3/uL (ref 0.2–1.0)
Monocytes Relative: 11 %
NEUTROS ABS: 4.3 10*3/uL (ref 1.4–6.5)
NEUTROS PCT: 57 %

## 2015-09-19 LAB — CBC
HCT: 45.5 % (ref 40.0–52.0)
HEMOGLOBIN: 15 g/dL (ref 13.0–18.0)
MCH: 31.8 pg (ref 26.0–34.0)
MCHC: 33.1 g/dL (ref 32.0–36.0)
MCV: 96 fL (ref 80.0–100.0)
Platelets: 233 10*3/uL (ref 150–440)
RBC: 4.74 MIL/uL (ref 4.40–5.90)
RDW: 13 % (ref 11.5–14.5)
WBC: 7.7 10*3/uL (ref 3.8–10.6)

## 2015-09-19 NOTE — Patient Instructions (Addendum)
    Your procedure is scheduled on: September 27, 2015(Wednesday) Report to Day Surgery.[facility]) To find out your arrival time please call [phone] between 1PM - 3PM on September 26, 2015(Tuesday).  Remember: Instructions that are not followed completely may result in serious medical risk, up to and including death, or upon the discretion of your surgeon and anesthesiologist your surgery may need to be rescheduled.    __x__ 1. Do not eat food or drink liquids after midnight. No gum chewing or hard candies.     __x__ 2. No Alcohol for 24 hours before or after surgery.   ____ 3. Bring all medications with you on the day of surgery if instructed.    ___x_ 4. Notify your doctor if there is any change in your medical condition     (cold, fever, infections).     Do not wear jewelry, make-up, hairpins, clips or nail polish.  Do not wear lotions, powders, or perfumes. You may wear deodorant.  Do not shave 48 hours prior to surgery. Men may shave face and neck.  Do not bring valuables to the hospital.    [facility] is not responsible for any belongings or valuables.               Contacts, dentures or bridgework may not be worn into surgery.  Leave your suitcase in the car. After surgery it may be brought to your room.  For patients admitted to the hospital, discharge time is determined by your                treatment team.   Patients discharged the day of surgery will not be allowed to drive home.   Please read over the following fact sheets that you were given:      _x___ Take these medicines the morning of surgery with A SIP OF WATER:    1. Lamictal  2. Lithium  3. Wellbutrin      ____ Fleet Enema (as directed)   ____ Use CHG Soap as directed  ____ Use inhalers on the day of surgery  ____ Stop metformin 2 days prior to surgery    ____ Take 1/2 of usual insulin dose the night before surgery and none on the morning of surgery.   _x___ Stop Coumadin/Plavix/aspirin on  (Patient has stopped Aspirin)  ____ Stop Anti-inflammatories on    _x__ Stop supplements until after surgery.  (Patient has stopped all supplements one week ago)  __x__ Bring C-Pap to the hospital. (Bring BIPAP to hospital the day  of surgery)

## 2015-09-27 ENCOUNTER — Encounter
Admission: RE | Disposition: A | Discharge status: Home / Self Care | Payer: Self-pay | Source: Ambulatory Visit | Attending: Otolaryngology

## 2015-09-27 ENCOUNTER — Ambulatory Visit
Admission: RE | Admit: 2015-09-27 | Discharge: 2015-09-27 | Disposition: A | Discharge status: Home / Self Care | Payer: Medicare Other | Source: Ambulatory Visit | Attending: Otolaryngology | Admitting: Otolaryngology

## 2015-09-27 ENCOUNTER — Ambulatory Visit: Payer: Medicare Other | Admitting: Anesthesiology

## 2015-09-27 ENCOUNTER — Encounter: Payer: Self-pay | Admitting: *Deleted

## 2015-09-27 DIAGNOSIS — F3181 Bipolar II disorder: Secondary | ICD-10-CM | POA: Diagnosis not present

## 2015-09-27 DIAGNOSIS — J343 Hypertrophy of nasal turbinates: Secondary | ICD-10-CM | POA: Diagnosis not present

## 2015-09-27 DIAGNOSIS — Z79899 Other long term (current) drug therapy: Secondary | ICD-10-CM | POA: Diagnosis not present

## 2015-09-27 DIAGNOSIS — M1990 Unspecified osteoarthritis, unspecified site: Secondary | ICD-10-CM | POA: Insufficient documentation

## 2015-09-27 DIAGNOSIS — Z96652 Presence of left artificial knee joint: Secondary | ICD-10-CM | POA: Insufficient documentation

## 2015-09-27 DIAGNOSIS — G4730 Sleep apnea, unspecified: Secondary | ICD-10-CM | POA: Insufficient documentation

## 2015-09-27 DIAGNOSIS — F419 Anxiety disorder, unspecified: Secondary | ICD-10-CM | POA: Diagnosis not present

## 2015-09-27 DIAGNOSIS — E039 Hypothyroidism, unspecified: Secondary | ICD-10-CM | POA: Diagnosis not present

## 2015-09-27 DIAGNOSIS — Z87891 Personal history of nicotine dependence: Secondary | ICD-10-CM | POA: Insufficient documentation

## 2015-09-27 DIAGNOSIS — J342 Deviated nasal septum: Secondary | ICD-10-CM | POA: Insufficient documentation

## 2015-09-27 DIAGNOSIS — F329 Major depressive disorder, single episode, unspecified: Secondary | ICD-10-CM | POA: Insufficient documentation

## 2015-09-27 DIAGNOSIS — J3489 Other specified disorders of nose and nasal sinuses: Secondary | ICD-10-CM | POA: Diagnosis not present

## 2015-09-27 HISTORY — PX: SEPTOPLASTY: SHX2393

## 2015-09-27 HISTORY — PX: NASAL TURBINATE REDUCTION: SHX2072

## 2015-09-27 SURGERY — SEPTOPLASTY, NOSE
Anesthesia: General | Site: Nose | Wound class: Clean

## 2015-09-27 MED ORDER — HYDROCODONE-ACETAMINOPHEN 5-325 MG PO TABS: 1.0000 | ORAL_TABLET | ORAL | Status: DC | PRN

## 2015-09-27 MED ORDER — OXYCODONE HCL 5 MG PO TABS: 5.0000 mg | ORAL_TABLET | Freq: Once | ORAL | Status: DC | PRN

## 2015-09-27 MED ORDER — NEOSTIGMINE METHYLSULFATE 10 MG/10ML IV SOLN
INTRAVENOUS | Status: DC | PRN
  Administered 2015-09-27: 1 mg via INTRAVENOUS

## 2015-09-27 MED ORDER — SUCCINYLCHOLINE CHLORIDE 20 MG/ML IJ SOLN
INTRAMUSCULAR | Status: DC | PRN
  Administered 2015-09-27: 100 mg via INTRAVENOUS

## 2015-09-27 MED ORDER — LIDOCAINE-EPINEPHRINE (PF) 1 %-1:200000 IJ SOLN
INTRAMUSCULAR | Status: DC | PRN
  Administered 2015-09-27: 10 mL

## 2015-09-27 MED ORDER — LIDOCAINE-EPINEPHRINE (PF) 1 %-1:200000 IJ SOLN
INTRAMUSCULAR | Status: AC
  Filled 2015-09-27: qty 30

## 2015-09-27 MED ORDER — BACITRACIN ZINC 500 UNIT/GM EX OINT
TOPICAL_OINTMENT | CUTANEOUS | Status: AC
  Filled 2015-09-27: qty 28.35

## 2015-09-27 MED ORDER — PROPOFOL 10 MG/ML IV BOLUS
INTRAVENOUS | Status: DC | PRN
Start: 2015-09-27 — End: 2015-09-27
  Administered 2015-09-27: 140 mg via INTRAVENOUS

## 2015-09-27 MED ORDER — FENTANYL CITRATE (PF) 100 MCG/2ML IJ SOLN
INTRAMUSCULAR | Status: DC | PRN
  Administered 2015-09-27: 50 ug via INTRAVENOUS
  Administered 2015-09-27: 100 ug via INTRAVENOUS

## 2015-09-27 MED ORDER — ROCURONIUM BROMIDE 100 MG/10ML IV SOLN
INTRAVENOUS | Status: DC | PRN
  Administered 2015-09-27: 10 mg via INTRAVENOUS

## 2015-09-27 MED ORDER — ONDANSETRON HCL 4 MG/2ML IJ SOLN
INTRAMUSCULAR | Status: DC | PRN
  Administered 2015-09-27: 4 mg via INTRAVENOUS

## 2015-09-27 MED ORDER — LIDOCAINE HCL (CARDIAC) 20 MG/ML IV SOLN
INTRAVENOUS | Status: DC | PRN
  Administered 2015-09-27: 100 mg via INTRAVENOUS

## 2015-09-27 MED ORDER — FAMOTIDINE 20 MG PO TABS
ORAL_TABLET | ORAL | Status: AC
  Filled 2015-09-27: qty 1

## 2015-09-27 MED ORDER — DEXAMETHASONE SODIUM PHOSPHATE 4 MG/ML IJ SOLN
INTRAMUSCULAR | Status: DC | PRN
  Administered 2015-09-27: 10 mg via INTRAVENOUS

## 2015-09-27 MED ORDER — LACTATED RINGERS IV SOLN
INTRAVENOUS | Status: DC
  Administered 2015-09-27: 08:00:00 via INTRAVENOUS

## 2015-09-27 MED ORDER — GLYCOPYRROLATE 0.2 MG/ML IJ SOLN
INTRAMUSCULAR | Status: DC | PRN
  Administered 2015-09-27: 0.2 mg via INTRAVENOUS

## 2015-09-27 MED ORDER — CEFPROZIL 500 MG PO TABS: 500.0000 mg | ORAL_TABLET | Freq: Two times a day (BID) | ORAL | Status: DC

## 2015-09-27 MED ORDER — OXYMETAZOLINE HCL 0.05 % NA SOLN
NASAL | Status: AC
Start: 2015-09-27 — End: 2015-09-27
  Filled 2015-09-27: qty 15

## 2015-09-27 MED ORDER — FENTANYL CITRATE (PF) 100 MCG/2ML IJ SOLN
INTRAMUSCULAR | Status: AC
  Administered 2015-09-27: 25 ug via INTRAVENOUS
  Filled 2015-09-27: qty 2

## 2015-09-27 MED ORDER — OXYCODONE HCL 5 MG/5ML PO SOLN: 5.0000 mg | Freq: Once | ORAL | Status: DC | PRN

## 2015-09-27 MED ORDER — FENTANYL CITRATE (PF) 100 MCG/2ML IJ SOLN
25.0000 ug | INTRAMUSCULAR | Status: DC | PRN
  Administered 2015-09-27 (×2): 25 ug via INTRAVENOUS

## 2015-09-27 MED ORDER — FAMOTIDINE 20 MG PO TABS
20.0000 mg | ORAL_TABLET | Freq: Once | ORAL | Status: AC
  Administered 2015-09-27: 20 mg via ORAL

## 2015-09-27 SURGICAL SUPPLY — 22 items
APPLICATOR COTTON TIP 6IN STRL (MISCELLANEOUS) ×4 IMPLANT
BANDAGE EYE OVAL (MISCELLANEOUS) ×2 IMPLANT
BLADE SURG 15 STRL LF DISP TIS (BLADE) ×2 IMPLANT
BLADE SURG 15 STRL SS (BLADE) ×4
CANISTER SUCT 1200ML W/VALVE (MISCELLANEOUS) ×4 IMPLANT
COAG SUCT 10F 3.5MM HAND CTRL (MISCELLANEOUS) ×4 IMPLANT
GLOVE BIO SURGEON STRL SZ7.5 (GLOVE) ×4 IMPLANT
GOWN STRL REUS W/ TWL LRG LVL3 (GOWN DISPOSABLE) ×4 IMPLANT
GOWN STRL REUS W/TWL LRG LVL3 (GOWN DISPOSABLE) ×8
KIT RM TURNOVER STRD PROC AR (KITS) ×4 IMPLANT
LABEL OR SOLS (LABEL) ×4 IMPLANT
NS IRRIG 500ML POUR BTL (IV SOLUTION) ×4 IMPLANT
PACK HEAD/NECK (MISCELLANEOUS) ×4 IMPLANT
PAD GROUND ADULT SPLIT (MISCELLANEOUS) ×4 IMPLANT
SPLINT NASAL SEPTAL PRE-CUT (MISCELLANEOUS) ×4 IMPLANT
SPONGE NEURO XRAY DETECT 1X3 (DISPOSABLE) ×4 IMPLANT
SUT CHROMIC 4 0 RB 1X27 (SUTURE) ×2 IMPLANT
SUT ETH BLK MONO 3 0 FS 1 12/B (SUTURE) ×4 IMPLANT
SUT ETHILON 3-0 KS 30 BLK (SUTURE) ×4 IMPLANT
SUT PLAIN GUT 4-0 (SUTURE) ×4 IMPLANT
SWAB CULTURE AMIES ANAERIB BLU (MISCELLANEOUS) ×2 IMPLANT
SYR 3ML LL SCALE MARK (SYRINGE) ×4 IMPLANT

## 2015-09-27 NOTE — Discharge Instructions (Signed)
° °  Follow endoscopic sinus surgery instruction sheet.    AMBULATORY SURGERY  DISCHARGE INSTRUCTIONS   1) The drugs that you were given will stay in your system until tomorrow so for the next 24 hours you should not:  A) Drive an automobile B) Make any legal decisions C) Drink any alcoholic beverage   2) You may resume regular meals tomorrow.  Today it is better to start with liquids and gradually work up to solid foods.  You may eat anything you prefer, but it is better to start with liquids, then soup and crackers, and gradually work up to solid foods.   3) Please notify your doctor immediately if you have any unusual bleeding, trouble breathing, redness and pain at the surgery site, drainage, fever, or pain not relieved by medication.    4) Additional Instructions:        Please contact your physician with any problems or Same Day Surgery at [phone], Monday through Friday 6 am to 4 pm, or [place] at [facility] Main number at [phone]. Follow-up Information    Follow up with Sandi MealyBennett, Paul S, MD On 10/04/2015.   Specialty:  Otolaryngology   Why:  at 2:30   Contact information:   [address] [place] [id number] [phone]

## 2015-09-27 NOTE — Transfer of Care (Signed)
Immediate Anesthesia Transfer of Care Note  Patient: Adrian Lewis  Procedure(s) Performed: Procedure(s): SEPTOPLASTY (N/A) TURBINATE REDUCTION/SUBMUCOSAL RESECTION (Bilateral)  Patient Location: PACU  Anesthesia Type:General  Level of Consciousness: awake  Airway & Oxygen Therapy: Patient Spontanous Breathing and Patient connected to face mask oxygen  Post-op Assessment: Report given to RN and Post -op Vital signs reviewed and stable  Post vital signs: stable  Last Vitals:  Filed Vitals:   09/27/15 1025  BP: 146/74  Pulse: 61  Temp: 36.6 C  Resp: 15    Complications: No apparent anesthesia complications

## 2015-09-27 NOTE — Anesthesia Preprocedure Evaluation (Addendum)
Anesthesia Evaluation  Patient identified by MRN, date of birth, ID band Patient awake    Reviewed: Allergy & Precautions, H&P , NPO status , Patient's Chart, lab work & pertinent test results  History of Anesthesia Complications Negative for: history of anesthetic complications  Airway Mallampati: III  TM Distance: >3 FB Neck ROM: limited    Dental no notable dental hx. (+) Teeth Intact   Pulmonary neg shortness of breath, sleep apnea (w BiPAP) ,    Pulmonary exam normal breath sounds clear to auscultation       Cardiovascular Exercise Tolerance: Good (-) angina(-) Past MI and (-) DOE negative cardio ROS Normal cardiovascular exam Rhythm:regular Rate:Normal     Neuro/Psych PSYCHIATRIC DISORDERS Anxiety Depression Bipolar Disorder negative neurological ROS     GI/Hepatic negative GI ROS, Neg liver ROS,   Endo/Other  Hypothyroidism   Renal/GU Renal disease  negative genitourinary   Musculoskeletal  (+) Arthritis ,   Abdominal   Peds  Hematology negative hematology ROS (+)   Anesthesia Other Findings Past Medical History:   Bipolar 2 disorder (HCC)                                     Renal calculus, left                                         Hypothyroidism                                               Arthritis                                                    Sleep apnea                                                  Depression                                                   Anxiety                                                      Macular degeneration disease                                Past Surgical History:   REPAIR RIGHT INGUINAL HERNIA W/ MESH             02-14-2006   HAMMER TOE SURGERY  2012  &  *     Comment:ONE TOE , EACH FOOT   CATARACT EXTRACTION W/ INTRAOCULAR LENS IMPLANT  2011 (APP*     Comment:RIGHT EYE AND REPAIR DETACHED RETINA   URETEROSCOPY                                      08/12/2012      Comment:Procedure: URETEROSCOPY;  Surgeon: Sebastian Acheheodore               Manny, MD;  [facility];  Service: Urology;  Laterality: Left;                1 HR    KNEE SURGERY                                    Left                Comment:x 2   EYE SURGERY                                                   HERNIA REPAIR                                   Bilateral                Comment:Inguinal Hernia Repair   JOINT REPLACEMENT                               Left 2014           Comment:Partial Knee Replacement  BMI    Body Mass Index   25.85 kg/m 2      Reproductive/Obstetrics negative OB ROS                             Anesthesia Physical Anesthesia Plan  ASA: III  Anesthesia Plan: General ETT   Post-op Pain Management:    Induction:   Airway Management Planned:   Additional Equipment:   Intra-op Plan:   Post-operative Plan:   Informed Consent: I have reviewed the patients History and Physical, chart, labs and discussed the procedure including the risks, benefits and alternatives for the proposed anesthesia with the patient or authorized representative who has indicated his/her understanding and acceptance.   Dental Advisory Given  Plan Discussed with: Anesthesiologist, CRNA and Surgeon  Anesthesia Plan Comments:         Anesthesia Quick Evaluation

## 2015-09-27 NOTE — Anesthesia Procedure Notes (Signed)
Procedure Name: Intubation Date/Time: 09/27/2015 8:35 AM Performed by: Irving BurtonBACHICH, Lasasha Brophy Pre-anesthesia Checklist: Patient identified, Emergency Drugs available, Suction available and Patient being monitored Patient Re-evaluated:Patient Re-evaluated prior to inductionOxygen Delivery Method: Circle system utilized Preoxygenation: Pre-oxygenation with 100% oxygen Intubation Type: IV induction Ventilation: Oral airway inserted - appropriate to patient size and Mask ventilation without difficulty Laryngoscope Size: Mac and 4 Grade View: Grade II Tube type: Oral Tube size: 7.5 mm Number of attempts: 1 Airway Equipment and Method: Patient positioned with wedge pillow and Stylet Placement Confirmation: positive ETCO2 and breath sounds checked- equal and bilateral Secured at: 23 cm Tube secured with: Tape Dental Injury: Teeth and Oropharynx as per pre-operative assessment

## 2015-09-27 NOTE — Op Note (Signed)
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 Hypertrophy of the inferior turbinates  Procedure: The patient was taken to the Operating Room and placed in the supine position.  After induction of general endotracheal anesthesia, the table was turned 90 degrees. A time-out was issued to confirm the site and procedure. The nasal septum and inferior turbinates where then injected with 1% lidocaine with epiniephrine, 1:200,000. The nose was decongested with Afrin soaked pledgets. The patient was then prepped and draped in the usual sterile fashion. Beginning on the left hand side a hemitransfixion incision was then created on the leading edge of the septum on the left.  A subperichondrial plane was elevated posteriorly on the left and taken back to the perpendicular plate of the ethmoid where subperiosteal plane was elevated posteriorly on the left. A large septal spur was identified on the left hand side.  An inferior rim of redundant septal cartilage was removed from where it deviated over the maxillary crest. The perpendicular plate of the ethmoid was separated from the quadrangular cartilage, and a subperiosteal plane elevated on the right of the bony septum. The large septal spur was removed, dividing the septal bone superiorly with Knight scissors, and inferiorly from the maxillary crest with a chisel.    The septum was then replaced in the midline. Reinspection through each nostril showed excellent reduction of the septal deformity.  The septal incision was closed with 4-0 chromic gut suture. A 4-0 plain gut  suture was used to reapproximate the septal flaps to the underlying cartilage, utilizing a running, quilting type stitch. There was some perforation of the mucoperichondrium from elevating the flaps over the large irregular spur, and the suturing was meticulously placed to help close the perforation.  With the septoplasty completed, beginning on the left-hand side, a 15 blade was used to incise along the inferior edge of the inferior turbinate. A superior laterally based flap of the medial turbinate mucosa was then elevated. A portion of the underlying conchal bone and lateral mucosa was excised using Knight scissors. The flap was then laid back over the turbinate stump and the bleeding edge of the turbinate cauterized using suction cautery. In a similar fashion the submucous resection was performed on the right.  Bleeding was well controlled at the completion of the procedure.  The patient was then returned to the anesthesiologist for awakening, and was taken to the Recovery Room in stable condition.  Disposition:   PACU to home  Plan: Soft, bland diet and push fluids. Take pain medications and antibiotics as prescribed. No strenuous activity for 2 weeks. Follow-up in 3 weeks.  Sandi MealyBennett, Adrian Lewis 09/27/2015 10:12 AM

## 2015-09-27 NOTE — H&P (Signed)
History and physical reviewed and will be scanned in later. No change in medical status reported by the patient or family, appears stable for surgery. All questions regarding the procedure answered, and patient (or family if a child) expressed understanding of the procedure.  Kate Larock S @TODAY@ 

## 2015-09-27 NOTE — Anesthesia Postprocedure Evaluation (Signed)
  Anesthesia Post-op Note  Patient: Adrian Lewis  Procedure(s) Performed: Procedure(s): SEPTOPLASTY (N/A) TURBINATE REDUCTION/SUBMUCOSAL RESECTION (Bilateral)  Anesthesia type:General ETT  Patient location: PACU  Post pain: Pain level controlled  Post assessment: Post-op Vital signs reviewed, Patient's Cardiovascular Status Stable, Respiratory Function Stable, Patent Airway and No signs of Nausea or vomiting  Post vital signs: Reviewed and stable  Last Vitals:  Filed Vitals:   09/27/15 1212  BP: 146/70  Pulse: 73  Temp:   Resp: 16    Level of consciousness: awake, alert  and patient cooperative  Complications: No apparent anesthesia complications

## 2016-01-10 DIAGNOSIS — M79673 Pain in unspecified foot: Secondary | ICD-10-CM

## 2016-04-08 ENCOUNTER — Encounter: Payer: Self-pay | Admitting: Podiatry

## 2016-04-08 ENCOUNTER — Ambulatory Visit (INDEPENDENT_AMBULATORY_CARE_PROVIDER_SITE_OTHER): Payer: Medicare Other | Admitting: Podiatry

## 2016-04-08 VITALS — BP 149/80 | HR 79 | Resp 12

## 2016-04-08 DIAGNOSIS — M778 Other enthesopathies, not elsewhere classified: Secondary | ICD-10-CM

## 2016-04-08 DIAGNOSIS — Q828 Other specified congenital malformations of skin: Secondary | ICD-10-CM

## 2016-04-08 DIAGNOSIS — M2041 Other hammer toe(s) (acquired), right foot: Secondary | ICD-10-CM

## 2016-04-08 DIAGNOSIS — M7751 Other enthesopathy of right foot: Secondary | ICD-10-CM

## 2016-04-08 DIAGNOSIS — M779 Enthesopathy, unspecified: Secondary | ICD-10-CM

## 2016-04-08 DIAGNOSIS — M722 Plantar fascial fibromatosis: Secondary | ICD-10-CM | POA: Diagnosis not present

## 2016-04-08 NOTE — Progress Notes (Signed)
He presents today with a chief complaint of pain to the forefoot approximately 1 month ago. He states that he noticed this seemed to correlate with his gardening shoes. He states that recently he has started to feel much better. He does state that he has a fifth toe of the right foot which is extremely painful due to a blister or corn. Denies any changes in his past medical history medication allergies are or surgeries.  Objective: Vital signs are stable he is alert and oriented 3 pulses are palpable. 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 He has no reproducible pain on range of motion of the metatarsophalangeal joints.  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  Plan: He was scanned for a new set of orthotics today and I will follow-up with with no splint. We also debrided his reactive hyperkeratosis.

## 2016-04-10 ENCOUNTER — Ambulatory Visit: Payer: Medicare Other | Admitting: Podiatry

## 2016-04-10 ENCOUNTER — Telehealth: Payer: Self-pay | Admitting: *Deleted

## 2016-04-10 NOTE — Telephone Encounter (Signed)
Patient states he was in on Monday to see Dr. Al CorpusHyatt for his little toe and he give him some pads to try. Toe is still just as sore as it was on Monday and pads aren't helping. Wants to know if there is anything else that can be done?

## 2016-04-10 NOTE — Telephone Encounter (Signed)
I instructed pt to try epsom salt soaks daily and after cover the area with an light antibiotic ointment bandaid and the silicone pad until Monday or call if worsens or questions.  I told pt the area now had exposed new skin that was sensitive and would eventual calm, but if needed to call again or make an appt.

## 2016-04-19 ENCOUNTER — Encounter: Payer: Self-pay | Admitting: *Deleted

## 2016-05-06 ENCOUNTER — Ambulatory Visit (INDEPENDENT_AMBULATORY_CARE_PROVIDER_SITE_OTHER): Payer: Medicare Other | Admitting: Podiatry

## 2016-05-06 ENCOUNTER — Encounter: Payer: Self-pay | Admitting: Podiatry

## 2016-05-06 DIAGNOSIS — M79673 Pain in unspecified foot: Secondary | ICD-10-CM

## 2016-05-06 DIAGNOSIS — B351 Tinea unguium: Secondary | ICD-10-CM

## 2016-05-06 DIAGNOSIS — M79676 Pain in unspecified toe(s): Secondary | ICD-10-CM | POA: Diagnosis not present

## 2016-05-06 DIAGNOSIS — M2041 Other hammer toe(s) (acquired), right foot: Secondary | ICD-10-CM

## 2016-05-06 DIAGNOSIS — M722 Plantar fascial fibromatosis: Secondary | ICD-10-CM

## 2016-05-06 NOTE — Patient Instructions (Signed)

## 2016-05-06 NOTE — Progress Notes (Signed)
He presents today for follow-up of his painful fourth and fifth toes of the right foot. He states that they rubbing and they're painful. He states that he found a device that'll keep and separated. He would also like to have his nails trimmed because they're painful.  Objective: Vital signs are stable alert and oriented 3 pulses are palpable. Neurologic sensorium is intact. Degenerative flexors are intact. His toenails are thick yellow dystrophic with mycotic and painful palpation.  Assessment: Pain in limb secondary to onychomycosis.  Plan: Debridement of toenails 1 through 5 bilateral. He picked up his orthotics today he was provided with both oral and written home-going instructions for care and use of the orthotics. Follow up with me should there be problems with the orthotics.

## 2016-05-13 DIAGNOSIS — M79673 Pain in unspecified foot: Secondary | ICD-10-CM

## 2016-06-17 ENCOUNTER — Encounter: Payer: Self-pay | Admitting: Podiatry

## 2016-06-17 ENCOUNTER — Ambulatory Visit (INDEPENDENT_AMBULATORY_CARE_PROVIDER_SITE_OTHER): Payer: Medicare Other | Admitting: Podiatry

## 2016-06-17 DIAGNOSIS — M79676 Pain in unspecified toe(s): Secondary | ICD-10-CM

## 2016-06-17 DIAGNOSIS — M722 Plantar fascial fibromatosis: Secondary | ICD-10-CM | POA: Diagnosis not present

## 2016-06-17 DIAGNOSIS — B351 Tinea unguium: Secondary | ICD-10-CM

## 2016-06-17 DIAGNOSIS — M2041 Other hammer toe(s) (acquired), right foot: Secondary | ICD-10-CM | POA: Diagnosis not present

## 2016-06-17 DIAGNOSIS — G5781 Other specified mononeuropathies of right lower limb: Secondary | ICD-10-CM

## 2016-06-17 DIAGNOSIS — G5761 Lesion of plantar nerve, right lower limb: Secondary | ICD-10-CM

## 2016-06-17 NOTE — Progress Notes (Signed)
He presents today with chief complaint of a painful right forefoot. He states that he has numbness and pain that radiates up the foot and leg. He's concerned that this may interfere with his upcoming hiking trip. He would like to have his nails cut because they're too painful for him to cut into thick.  Objective: Vital signs are stable he is alert and oriented 3 pulses are palpable. Neurologic sensorium is intact. Deep tendon reflexes are intact. Palpable Mulder's click third interdigital space of the right foot. Mild dorsiflexion at the second metatarsophalangeal joint of the right foot from previous surgery. His toenails are elongated thickened and dystrophic.  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 Pain in limb secondary to onychomycosis and nail dystrophy.  Plan: Injected the area today with Kenalog and local anesthetic. He will continue to utilize his toe sling at night to help bring down the second toe. He will utilize his new orthotics for the height. I also debrided his toenails 1 through 5 bilateral.

## 2016-06-24 ENCOUNTER — Ambulatory Visit: Payer: Medicare Other | Admitting: Podiatry

## 2016-08-14 ENCOUNTER — Ambulatory Visit: Payer: PRIVATE HEALTH INSURANCE | Admitting: Podiatry

## 2016-08-19 DIAGNOSIS — M79673 Pain in unspecified foot: Secondary | ICD-10-CM

## 2016-09-05 ENCOUNTER — Telehealth: Payer: Self-pay | Admitting: *Deleted

## 2016-09-05 NOTE — Telephone Encounter (Addendum)
Pt states the Darco alignment brace has cause a red lumpy rash to develop since Monday. Pt states he is using cortisone cream and taking Benadryl. Pt asked is there a substitute for the Darco brace and what else can he do for the rash. 09/06/2016-Informed pt of Dr. Geryl RankinsHyatt's recommendations and pt states he went to his PCP and PCP feels it is related to a cat bite and started him on Amoxicillin.  I told pt to clean the brace with a mild soap and rinse well and air dry, but don't begin until the rash has subsided. Pt states understanding.

## 2016-09-05 NOTE — Telephone Encounter (Signed)
Yes stop brace and consider washing the brace with soap and water and let dry. Let rash resolve and then try again.

## 2016-11-08 ENCOUNTER — Other Ambulatory Visit: Payer: Self-pay | Admitting: Family Medicine

## 2016-12-18 ENCOUNTER — Other Ambulatory Visit: Payer: Self-pay | Admitting: Family Medicine

## 2017-01-31 ENCOUNTER — Other Ambulatory Visit: Payer: Self-pay | Admitting: Ophthalmology

## 2017-01-31 DIAGNOSIS — H532 Diplopia: Secondary | ICD-10-CM

## 2017-02-05 ENCOUNTER — Ambulatory Visit
Admission: RE | Admit: 2017-02-05 | Discharge: 2017-02-05 | Disposition: A | Discharge status: Home / Self Care | Payer: Medicare Other | Source: Ambulatory Visit | Attending: Ophthalmology | Admitting: Ophthalmology

## 2017-02-05 DIAGNOSIS — E041 Nontoxic single thyroid nodule: Secondary | ICD-10-CM | POA: Diagnosis not present

## 2017-02-05 DIAGNOSIS — H34219 Partial retinal artery occlusion, unspecified eye: Secondary | ICD-10-CM | POA: Insufficient documentation

## 2017-02-05 DIAGNOSIS — H532 Diplopia: Secondary | ICD-10-CM

## 2017-02-05 DIAGNOSIS — I6523 Occlusion and stenosis of bilateral carotid arteries: Secondary | ICD-10-CM | POA: Diagnosis not present

## 2017-03-24 ENCOUNTER — Encounter: Payer: Self-pay | Admitting: Podiatry

## 2017-03-24 ENCOUNTER — Ambulatory Visit (INDEPENDENT_AMBULATORY_CARE_PROVIDER_SITE_OTHER): Payer: Medicare Other | Admitting: Podiatry

## 2017-03-24 DIAGNOSIS — M779 Enthesopathy, unspecified: Principal | ICD-10-CM

## 2017-03-24 DIAGNOSIS — M7751 Other enthesopathy of right foot: Secondary | ICD-10-CM

## 2017-03-24 DIAGNOSIS — M778 Other enthesopathies, not elsewhere classified: Secondary | ICD-10-CM

## 2017-03-24 NOTE — Progress Notes (Signed)
He presents today for a chief concern of overlapping second digit of the hallux. His previously been fused and is now developing contracture deformities allowing the lesser digits to move medially. He states that he has under lapping toes which cause pain. He states the majority of the ball of his foot is becoming more painful over time.  Objective: Vital signs are stable he is alert and oriented 3. Pulses are palpable. He has contracture deformity of the second metatarsophalangeal joint with medial deviation of toes #3 #4 and #5 of the right foot. The majority of his tenderness is beneath the second metatarsal and to a lesser degree the second metatarsal phalangeal joint third metatarsophalangeal joint and fourth metatarsophalangeal joint. Leksell hammertoe deformities are also noted. Some mild tenderness of the third intermetatarsal space consistent with neuroma.  Assessment: Capsulitis and minimal medial deviation of the toes associated with the toe elevation and contracture deformity.  Plan: I placed him in a digital hammertoe correct her with metatarsal pad. Recommended that he try to find a silicone one to correct. And I will follow-up with him in 1 month.

## 2017-04-23 ENCOUNTER — Ambulatory Visit (INDEPENDENT_AMBULATORY_CARE_PROVIDER_SITE_OTHER): Payer: Medicare Other | Admitting: Podiatry

## 2017-04-23 ENCOUNTER — Encounter: Payer: Self-pay | Admitting: Podiatry

## 2017-04-23 DIAGNOSIS — M778 Other enthesopathies, not elsewhere classified: Secondary | ICD-10-CM

## 2017-04-23 DIAGNOSIS — M7751 Other enthesopathy of right foot: Secondary | ICD-10-CM

## 2017-04-23 DIAGNOSIS — M779 Enthesopathy, unspecified: Principal | ICD-10-CM

## 2017-04-23 NOTE — Progress Notes (Signed)
He presents today for follow-up of his hammertoe deformity and capsulitis of the second metatarsophalangeal joint of the right foot. He states that is doing much better he states that he found a toe separator for the fourth and fifth toe he's also utilizing the silicone pad to the plantar aspect of his foot which actually pulls down the second toe. He states this feels good and his pain has subsided. Her graft objective: Vital signs are stable alert and oriented 3 he doesn't have a lot of pain on palpation of the second metatarsophalangeal joint.  Assessment: Well-healing capsulitis.  Plan: Follow up with us on an as-needed basis.

## 2017-06-10 ENCOUNTER — Emergency Department: Payer: Medicare Other

## 2017-06-10 ENCOUNTER — Emergency Department
Admission: EM | Admit: 2017-06-10 | Discharge: 2017-06-11 | Disposition: A | Discharge status: Home / Self Care | Payer: Medicare Other | Attending: Emergency Medicine | Admitting: Emergency Medicine

## 2017-06-10 DIAGNOSIS — E039 Hypothyroidism, unspecified: Secondary | ICD-10-CM | POA: Diagnosis not present

## 2017-06-10 DIAGNOSIS — R2689 Other abnormalities of gait and mobility: Secondary | ICD-10-CM | POA: Diagnosis not present

## 2017-06-10 DIAGNOSIS — R531 Weakness: Secondary | ICD-10-CM | POA: Diagnosis not present

## 2017-06-10 DIAGNOSIS — R4182 Altered mental status, unspecified: Secondary | ICD-10-CM | POA: Diagnosis present

## 2017-06-10 DIAGNOSIS — R410 Disorientation, unspecified: Secondary | ICD-10-CM

## 2017-06-10 DIAGNOSIS — Z79899 Other long term (current) drug therapy: Secondary | ICD-10-CM | POA: Diagnosis not present

## 2017-06-10 LAB — COMPREHENSIVE METABOLIC PANEL
ALBUMIN: 4.2 g/dL (ref 3.5–5.0)
ALT: 34 U/L (ref 17–63)
AST: 36 U/L (ref 15–41)
Alkaline Phosphatase: 61 U/L (ref 38–126)
Anion gap: 9 (ref 5–15)
BUN: 12 mg/dL (ref 6–20)
CHLORIDE: 107 mmol/L (ref 101–111)
CO2: 23 mmol/L (ref 22–32)
CREATININE: 0.71 mg/dL (ref 0.61–1.24)
Calcium: 9.1 mg/dL (ref 8.9–10.3)
GFR calc non Af Amer: >60 mL/min (ref >60)
GLUCOSE: 122 mg/dL — AB (ref 65–99)
Potassium: 3.8 mmol/L (ref 3.5–5.1)
SODIUM: 139 mmol/L (ref 135–145)
Total Bilirubin: 0.6 mg/dL (ref 0.3–1.2)
Total Protein: 7.2 g/dL (ref 6.5–8.1)

## 2017-06-10 LAB — AMMONIA: Ammonia: 26 umol/L (ref 9–35)

## 2017-06-10 LAB — CBC
HCT: 44.9 % (ref 40.0–52.0)
HEMOGLOBIN: 15.4 g/dL (ref 13.0–18.0)
MCH: 32.8 pg (ref 26.0–34.0)
MCHC: 34.3 g/dL (ref 32.0–36.0)
MCV: 95.7 fL (ref 80.0–100.0)
PLATELETS: 249 10*3/uL (ref 150–440)
RBC: 4.69 MIL/uL (ref 4.40–5.90)
RDW: 12.9 % (ref 11.5–14.5)
WBC: 9.1 10*3/uL (ref 3.8–10.6)

## 2017-06-10 LAB — ETHANOL: Alcohol, Ethyl (B): 53 mg/dL — ABNORMAL HIGH (ref <5)

## 2017-06-10 LAB — LITHIUM LEVEL: LITHIUM LVL: 0.74 mmol/L (ref 0.60–1.20)

## 2017-06-10 LAB — TROPONIN I: Troponin I: <0.03 ng/mL (ref <0.03)

## 2017-06-10 NOTE — ED Notes (Addendum)
Per pt wife , states pt began having unsteady gait and incoherent speech at aprox 2030, per pt he noticed being " off balance" this morning in the garden. Wife states speech resolved about 2130. Pt is daily drinker, unknown if he had more tonight. MD Scotty CourtStafford made aware, verbal order given for CT head at this time. Speech is clear at this time, pt A&Ox4

## 2017-06-10 NOTE — ED Notes (Signed)
Pt states drinks aprox 6 liquor drinks daily. Pt denies any drug use

## 2017-06-10 NOTE — ED Notes (Signed)
Patient transported to CT 

## 2017-06-10 NOTE — ED Triage Notes (Signed)
Pt to triage in wheelchair. Pt witting in notebook not wanting to stop writing. Pts wife reports that approximately 2030 tonight pt started to lose balance while walking, was talking incoherently talking about "play scripts" and was not making since to wife. Wife reports pt had several alcoholic drinks this evening, no more than usual. Pts wife also reports pt has HX of bipolar and takes lithium. Lithium changed 2 weeks ago, pt now takes all of dose at one time instead of breaking the dose up throughout the day. Pt is A&O x4 when questions asked but is having flight of thoughts that are not connecting with RN's questions.

## 2017-06-11 ENCOUNTER — Emergency Department: Payer: Medicare Other

## 2017-06-11 DIAGNOSIS — R531 Weakness: Secondary | ICD-10-CM | POA: Diagnosis not present

## 2017-06-11 LAB — BLOOD GAS, VENOUS
ACID-BASE DEFICIT: 1.7 mmol/L (ref 0.0–2.0)
BICARBONATE: 23 mmol/L (ref 20.0–28.0)
FIO2: 0.21
O2 Saturation: 87.3 %
PCO2 VEN: 38 mmHg — AB (ref 44.0–60.0)
PH VEN: 7.39 (ref 7.250–7.430)
Patient temperature: 37
pO2, Ven: 54 mmHg — ABNORMAL HIGH (ref 32.0–45.0)

## 2017-06-11 NOTE — Discharge Instructions (Signed)
Please follow up with neurology and your primary care physician. Please return with any new or worsening symptoms.

## 2017-06-11 NOTE — ED Notes (Signed)
Pts clothing removed and pt taken to MRI by EDT Scott.

## 2017-06-11 NOTE — ED Provider Notes (Signed)
[facility] Emergency Department Provider Note   ____________________________________________   First MD Initiated Contact with Patient 06/10/17 2348     (approximate)  I have reviewed the triage vital signs and the nursing notes.   HISTORY  Chief Complaint Altered Mental Status and Weakness    HPI Adrian Lewis is a 71 y.o. male who comes into the hospital today with some staggering gait. His wife reports that this evening around 8 she noticed that he seemed to be having a staggering gait. She reports that he was also very confused and talking incoherently. He will start doing something and then forget what he was doing and then started again. He seemed very unsteady on his feet. The patient's wife reports that he told her he had been feeling like this all day. She reports that he seemed more coherent in the car but still not himself. He denies any unilateral weakness and sometimes he is difficult to understand. He's had no facial asymmetry, numbness. The patient did drink some alcohol tonight. He had beer and scotch but according to his wife no more than normal. He typically has several in the evening. He's been eating and drinking well. Approximately one month ago the patient changed the way he was taking his lithium. He used to take smaller doses but now takes 1 dose on that one time. The patient otherwise has no other changes in his medications. The patient's wife was concerned about a possible stroke or something else that could be causing the symptoms so she decided to bring him into the hospital for evaluation today.   Past Medical History:  Diagnosis Date  . Anxiety   . Arthritis   . Bipolar 2 disorder (HCC)   . Depression   . Hypothyroidism   . Macular degeneration disease   . Renal calculus, left   . Sleep apnea     Patient Active Problem List   Diagnosis Date Noted  . History of hernia repair 05/14/2012    Past Surgical History:    Procedure Laterality Date  . CATARACT EXTRACTION W/ INTRAOCULAR LENS IMPLANT  2011 (APPROX)   RIGHT EYE AND REPAIR DETACHED RETINA  . EYE SURGERY    . HAMMER TOE SURGERY  2012  &  2010  (APPROX)   ONE TOE , EACH FOOT  . HERNIA REPAIR Bilateral    Inguinal Hernia Repair  . JOINT REPLACEMENT Left 2014   Partial Knee Replacement  . KNEE SURGERY Left    x 2  . NASAL TURBINATE REDUCTION Bilateral 09/27/2015   Procedure: TURBINATE REDUCTION/SUBMUCOSAL RESECTION;  Surgeon: Geanie Logan, MD;  Location: [facility] ORS;  Service: ENT;  Laterality: Bilateral;  . REPAIR RIGHT INGUINAL HERNIA W/ MESH  02-14-2006  . SEPTOPLASTY N/A 09/27/2015   Procedure: SEPTOPLASTY;  Surgeon: Geanie Logan, MD;  Location: [facility] OR[facility];  Service: ENT;  Laterality: N/A;  . URETEROSCOPY  08/12/2012   Procedure: URETEROSCOPY;  Surgeon: Sebastian Ache, MD;  Location: [facility];  Service: Urology;  Laterality: Left;  1 HR     Prior to Admission medications   Medication Sig Start Date End Date Taking? Authorizing Provider  buPROPion (WELLBUTRIN SR) 100 MG 12 hr tablet Take 200 mg by mouth daily.   Yes [provider]  ergocalciferol (DRISDOL) 50000 UNITS capsule Take 50,000 Units by mouth once a week.   Yes [provider]  Eszopiclone 3 MG TABS Take 3 mg by mouth at bedtime.   Yes [provider]  lamoTRIgine (LAMICTAL) 100 MG tablet Take 100 mg by mouth daily.  04/01/12  Yes [provider]  levothyroxine (SYNTHROID, LEVOTHROID) 25 MCG tablet Take 25 mcg by mouth Daily.  03/19/12  Yes [provider]  lithium carbonate (ESKALITH) 450 MG CR tablet Take 900 mg by mouth daily.    Yes [provider]  potassium citrate (UROCIT-K) 10 MEQ (1080 MG) SR tablet Take 10 mEq by mouth 2 (two) times daily with a meal.   Yes [provider]    Allergies Patient has no known allergies.  Family History  Problem Relation Age of Onset  . Heart attack Father      Social History Social History  Substance Use Topics  . Smoking status: Never Smoker  . Smokeless tobacco: Never Used  . Alcohol use 8.4 oz/week    14 Cans of beer per week     Comment: everyday    Review of Systems  Constitutional: No fever/chills Eyes: No visual changes. ENT: No sore throat. Cardiovascular: Denies chest pain. Respiratory: Denies shortness of breath. Gastrointestinal: No abdominal pain.  No nausea, no vomiting.  No diarrhea.  No constipation. Genitourinary: Negative for dysuria. Musculoskeletal: Negative for back pain. Skin: Negative for rash. Neurological: Unsteady gait, difficulty with balance and intermittently unintelligible speech   ____________________________________________   PHYSICAL EXAM:  VITAL SIGNS: ED Triage Vitals  Enc Vitals Group     BP 06/10/17 2159 (!) 119/93     Pulse Rate 06/10/17 2159 90     Resp 06/10/17 2159 15     Temp 06/10/17 2159 97.7 F (36.5 C)     Temp Source 06/10/17 2159 Oral     SpO2 06/10/17 2159 97 %     Weight --      Height --      Head Circumference --      Peak Flow --      Pain Score 06/10/17 2222 0     Pain Loc --      Pain Edu? --      Excl. in GC? --     Constitutional: Sleepy but arousable. Well appearing and in no acute distress. Eyes: Conjunctivae are normal. PERRL. EOMI. Head: Atraumatic. Nose: No congestion/rhinnorhea. Mouth/Throat: Mucous membranes are moist.  Oropharynx non-erythematous. Cardiovascular: Normal rate, regular rhythm. Grossly normal heart sounds.  Good peripheral circulation. Respiratory: Normal respiratory effort.  No retractions. Lungs CTAB. Gastrointestinal: Soft and nontender. No distention. Positive bowel sounds Musculoskeletal: No lower extremity tenderness nor edema.   Neurologic:  Normal speech and language. Cranial nerves II through XII are grossly intact with no focal motor or neuro deficit, strength is 5 out of 5 with no pronator drift, no difficulty with  finger-to-nose, sensation intact throughout. Skin:  Skin is warm, dry and intact.  Psychiatric: Mood and affect are normal.   ____________________________________________   LABS (all labs ordered are listed, but only abnormal results are displayed)  Labs Reviewed  COMPREHENSIVE METABOLIC PANEL - Abnormal; Notable for the following:       Result Value   Glucose, Bld 122 (*)    All other components within normal limits  ETHANOL - Abnormal; Notable for the following:    Alcohol, Ethyl (B) 53 (*)    All other components within normal limits  BLOOD GAS, VENOUS - Abnormal; Notable for the following:    pCO2, Ven 38 (*)    pO2, Ven 54.0 (*)    All other components within normal limits  CBC  TROPONIN I  AMMONIA  LITHIUM LEVEL  CBG MONITORING, ED   ____________________________________________  EKG  ED ECG REPORT I, Rebecka Apley, the attending physician, personally viewed and interpreted this ECG.   Date: 06/10/2017  EKG Time: 2206  Rate: 90  Rhythm: normal sinus rhythm, 1st degree AV block, incomplete bundle branch block  Axis: left axis deviation  Intervals:incomplete RBBB  ST&T Change: none ____________________________________________  RADIOLOGY  Ct Head Wo Contrast  Result Date: 06/10/2017 CLINICAL DATA:  Balance difficulty. Unsteady gait. Incoherent speech. EXAM: CT HEAD WITHOUT CONTRAST TECHNIQUE: Contiguous axial images were obtained from the base of the skull through the vertex without intravenous contrast. COMPARISON:  Head CT 09/10/2015 FINDINGS: Brain: No mass lesion, intraparenchymal hemorrhage or extra-axial collection. No evidence of acute cortical infarct. Brain parenchyma and CSF-containing spaces are normal for age. Vascular: No hyperdense vessel or unexpected calcification. Skull: Normal visualized skull base, calvarium and extracranial soft tissues. Sinuses/Orbits: Moderate ethmoid sinus mucosal thickening. No mastoid or middle ear effusion. Normal  orbits. IMPRESSION: Normal brain for age. Electronically Signed   By: Deatra Robinson M.D.   On: 06/10/2017 23:04   Mr Brain Wo Contrast  Addendum Date: 06/11/2017   ADDENDUM REPORT: 06/11/2017 03:34 ADDENDUM: There is moderate ethmoid sinus mucosal thickening. Electronically Signed   By: Deatra Robinson M.D.   On: 06/11/2017 03:34   Result Date: 06/11/2017 CLINICAL DATA:  Altered mental status.  Unsteady gait. EXAM: MRI HEAD WITHOUT CONTRAST TECHNIQUE: Multiplanar, multiecho pulse sequences of the brain and surrounding structures were obtained without intravenous contrast. COMPARISON:  Head CT 06/10/2017 FINDINGS: Brain: The midline structures are normal. There is no focal diffusion restriction to indicate acute infarct. There is mild hyperintense T2-weighted signal within the periventricular white matter, most often seen in the setting of chronic microvascular ischemia. No intraparenchymal hematoma or chronic microhemorrhage. Brain volume is normal for age without age-advanced or lobar predominant atrophy. The dura is normal and there is no extra-axial collection. Vascular: Major intracranial arterial and venous sinus flow voids are preserved. Skull and upper cervical spine: The visualized skull base, calvarium, upper cervical spine and extracranial soft tissues are normal. Sinuses/Orbits: No fluid levels or advanced mucosal thickening. No mastoid effusion. Normal orbits. IMPRESSION: 1. No acute intracranial abnormality. 2. 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 Electronically Signed: By: Deatra Robinson M.D. On: 06/11/2017 03:26    ____________________________________________   PROCEDURES  Procedure(s) performed: None  Procedures  Critical Care performed: No  ____________________________________________   INITIAL IMPRESSION / ASSESSMENT AND PLAN / ED COURSE  Pertinent labs & imaging results that were available during my care of the patient were reviewed by me and considered in  my medical decision making (see chart for details).  This is a 71 year old male who comes in today with some difficulty with his balance. Although the patient's wife noticed that this evening the patient has been having these symptoms all day. He is not having any other symptoms. I did check some blood work to include a CBC and a CMP as well as a lithium level ammonia and a venous blood gas. The patient does have obstructive sleep apnea. All of the patient's blood work returned unremarkable. He also received a CT of his head looking for possible stroke as well as an MRI of his brain. The rest of his imaging again is unremarkable. I am unsure of the cause of the symptoms. At this time nothing has been revealed the patient's imaging studies or blood work.  I feel that the patient may be discharged to home and can follow up with his primary care physician as well as neurology for further evaluation of these symptoms. The patient has no further questions or concerns at this time. The patient will be discharged to home to follow-up.      ____________________________________________   FINAL CLINICAL IMPRESSION(S) / ED DIAGNOSES  Final diagnoses:  Weakness  Confusion  Balance problems      NEW MEDICATIONS STARTED DURING THIS VISIT:  Discharge Medication List as of 06/11/2017  3:43 AM       Note:  This document was prepared using Dragon voice recognition software and may include unintentional dictation errors.    Rebecka Apley, MD 06/11/17 [phone]

## 2017-06-16 ENCOUNTER — Ambulatory Visit (INDEPENDENT_AMBULATORY_CARE_PROVIDER_SITE_OTHER): Payer: Medicare Other | Admitting: Neurology

## 2017-06-16 ENCOUNTER — Encounter: Payer: Self-pay | Admitting: Neurology

## 2017-06-16 VITALS — BP 141/87 | HR 73 | Ht 68.0 in | Wt 167.6 lb

## 2017-06-16 DIAGNOSIS — F411 Generalized anxiety disorder: Secondary | ICD-10-CM

## 2017-06-16 DIAGNOSIS — F419 Anxiety disorder, unspecified: Secondary | ICD-10-CM

## 2017-06-16 DIAGNOSIS — G4701 Insomnia due to medical condition: Secondary | ICD-10-CM

## 2017-06-16 DIAGNOSIS — F5101 Primary insomnia: Secondary | ICD-10-CM | POA: Insufficient documentation

## 2017-06-16 DIAGNOSIS — T50901D Poisoning by unspecified drugs, medicaments and biological substances, accidental (unintentional), subsequent encounter: Secondary | ICD-10-CM | POA: Diagnosis not present

## 2017-06-16 DIAGNOSIS — F319 Bipolar disorder, unspecified: Secondary | ICD-10-CM | POA: Diagnosis not present

## 2017-06-16 DIAGNOSIS — T50901A Poisoning by unspecified drugs, medicaments and biological substances, accidental (unintentional), initial encounter: Secondary | ICD-10-CM

## 2017-06-16 DIAGNOSIS — G4700 Insomnia, unspecified: Secondary | ICD-10-CM | POA: Insufficient documentation

## 2017-06-16 HISTORY — DX: Generalized anxiety disorder: F41.1

## 2017-06-16 HISTORY — DX: Insomnia due to medical condition: G47.01

## 2017-06-16 HISTORY — DX: Poisoning by unspecified drugs, medicaments and biological substances, accidental (unintentional), initial encounter: T50.901A

## 2017-06-16 NOTE — Progress Notes (Signed)
NEUROLOGY CLINIC NEW PATIENT NOTE  NAME: STEPHENS SHREVE DOB: 1946-05-26 REFERRING PHYSICIAN: Dorothey Baseman, MD  I saw Osborne Oman Ganoe as a new consult in the neurovascular clinic today regarding  Chief Complaint  Patient presents with  . New Patient (Initial Visit)    Referral from ED visit on 06/10/2017 for weakness gait confusion,balancing problems, pt and wife thinks it could of been from the [place] because he took if differently than usual  .  HPI: JOBAN COLLEDGE is a 71 y.o. male with PMH of Bipolar disorder on lithium and Lamictal, depression, anxiety, OSA on CPAP and ED who presents as a new patient for ED follow up.   Patient stated that on 06/10/17 9 PM, patient took 7 pills of "viagra" as usual for sex, however, wife found him to be very dizzy, lost of balance, endocrine on his feet, falling at the bedside. Very strange behavior talking incoherent, disorientated acting out of dream, speech not make sense. However, there is no focal neurological deficit. 9:30pm, pt went to [facility] ER, had CT and MRI brain showed no acute abnormalities. Lithium and ammonia level were WNL. He was admitted for observation. He slept all night and in the morning, he was back to baseline and was discharged with neurology outpt follow up.   After going home, he noticed that instead of taking "viagra", he actually took expired 7 tablets of eszopiclone 2mg . this explained his strength behavior the night. Currently, he is at his baseline, no specific complaints. He follows with his psychiatrist and plan to taper off lithium. He also had problems with his CPAP mask and plan to follow up with his prescribing doc for mask adjustment. He continued to have insomnia, and will follow up with PCP for sleeping aid. BP today 141/87  Past Medical History:  Diagnosis Date  . Anxiety   . Arthritis   . Bipolar 2 disorder (HCC)   . Depression   . Hypothyroidism   . Insomnia   . Macular degeneration disease   . Renal  calculus, left   . Sleep apnea    Past Surgical History:  Procedure Laterality Date  . CATARACT EXTRACTION W/ INTRAOCULAR LENS IMPLANT  2011 (APPROX)   RIGHT EYE AND REPAIR DETACHED RETINA  . EYE SURGERY    . HAMMER TOE SURGERY  2012  &  2010  (APPROX)   ONE TOE , EACH FOOT  . HERNIA REPAIR Bilateral    Inguinal Hernia Repair  . JOINT REPLACEMENT Left 2014   Partial Knee Replacement  . KNEE SURGERY Left    x 2  . NASAL TURBINATE REDUCTION Bilateral 09/27/2015   Procedure: TURBINATE REDUCTION/SUBMUCOSAL RESECTION;  Surgeon: Geanie Logan, MD;  Location: [facility] ORS;  Service: ENT;  Laterality: Bilateral;  . REPAIR RIGHT INGUINAL HERNIA W/ MESH  02-14-2006  . SEPTOPLASTY N/A 09/27/2015   Procedure: SEPTOPLASTY;  Surgeon: Geanie Logan, MD;  Location: [facility] OR[facility];  Service: ENT;  Laterality: N/A;  . URETEROSCOPY  08/12/2012   Procedure: URETEROSCOPY;  Surgeon: Sebastian Ache, MD;  Location: [facility];  Service: Urology;  Laterality: Left;  1 HR    Family History  Problem Relation Age of Onset  . Heart attack Father    Current Outpatient Prescriptions  Medication Sig Dispense Refill  . buPROPion (WELLBUTRIN SR) 100 MG 12 hr tablet Take 200 mg by mouth daily.    . ergocalciferol (DRISDOL) 50000 UNITS capsule Take 50,000 Units by mouth once a week.    [name]  Eszopiclone 3 MG TABS Take 3 mg by mouth at bedtime.    . lamoTRIgine (LAMICTAL) 100 MG tablet Take 100 mg by mouth daily.     [name]. levothyroxine (SYNTHROID, LEVOTHROID) 25 MCG tablet Take 25 mcg by mouth Daily.     [name]. lithium carbonate (ESKALITH) 450 MG CR tablet Take 900 mg by mouth daily.     . Multiple Vitamins-Minerals (OCUVITE PRESERVISION PO) Take by mouth.    . potassium citrate (UROCIT-K) 10 MEQ (1080 MG) SR tablet Take 10 mEq by mouth 2 (two) times daily with a meal.     No current facility-administered medications for this visit.    No Known Allergies Social History   Social History  . Marital status: Married     Spouse name: N/A  . Number of children: N/A  . Years of education: N/A   Occupational History  . Not on file.   Social History Main Topics  . Smoking status: Never Smoker  . Smokeless tobacco: Never Used  . Alcohol use 8.4 oz/week    14 Cans of beer per week     Comment: everyday, scotch on occasion  . Drug use: No  . Sexual activity: Not on file   Other Topics Concern  . Not on file   Social History Narrative  . No narrative on file    Review of Systems Full 14 system review of systems performed and notable only for those listed, all others are neg:  Constitutional:   Cardiovascular:  Ear/Nose/Throat:  Ringing in ears Skin:  Eyes:   Respiratory:   Gastroitestinal:   Genitourinary:  Hematology/Lymphatic:   Endocrine:  Musculoskeletal:   Allergy/Immunology:   Neurological:  Memory loss Psychiatric:  Sleep: Insomnia   Physical Exam  Vitals:   06/16/17 0942  BP: (!) 141/87  Pulse: 73    General - Well nourished, well developed, in no apparent distress.  Ophthalmologic - Sharp disc margins OU.  Cardiovascular - Regular rate and rhythm with no murmur.   Neck - supple, no nuchal rigidity .  Mental Status -  Level of arousal and orientation to time, place, and person were intact. Language including expression, naming, repetition, comprehension, reading, and writing was assessed and found intact. Attention span and concentration were normal. Fund of Knowledge was assessed and was intact.  Cranial Nerves II - XII - II - Visual field intact OU. III, IV, VI - Extraocular movements intact. V - Facial sensation intact bilaterally. VII - Facial movement intact bilaterally. VIII - Hearing & vestibular intact bilaterally. X - Palate elevates symmetrically. XI - Chin turning & shoulder shrug intact bilaterally. XII - Tongue protrusion intact.  Motor Strength - The patient's strength was normal in all extremities and pronator drift was absent.  Bulk was normal  and fasciculations were absent.   Motor Tone - Muscle tone was assessed at the neck and appendages and was normal.  Reflexes - The patient's reflexes were normal in all extremities and he had no pathological reflexes.  Sensory - Light touch, temperature/pinprick, vibration and proprioception, and Romberg testing were assessed and were normal.    Coordination - The patient had normal movements in the hands and feet with no ataxia or dysmetria.  Tremor was absent.  Gait and Station - The patient's transfers, gait, station, and turns were observed as normal except stooped posturing.   Imaging  I have personally reviewed the radiological images below and agree with the radiology interpretations.  Ct Head Wo Contrast 06/10/2017 IMPRESSION:  Normal brain for age.   Mr Brain Wo Contrast 06/11/2017 IMPRESSION: 1. No acute intracranial abnormality. 2. 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  Lab Review Component     Latest Ref Rng & Units 06/10/2017  Ammonia     9 - 35 umol/L 26  Lithium     0.60 - 1.20 mmol/L 0.74      Assessment and Plan:   In summary, VASILIOS OTTAWAY is a 71 y.o. male with PMH of Bipolar disorder on lithium and Lamictal, depression, anxiety, OSA on CPAP and ED who presents as a new patient for ED follow up of staggering gait and AMS. CT and MRI no acute changes. The episode most likely due to pt accidentally taking 14mg  of eszopiclone. He was educated on medication safety.   - continue current medications - discuss with PCP about sleeping aid if you want to change medications - follow up with psychiatry for bipolar management - be cautions with medication labels  - get up and stand up slowly. Use cane if needed for balance.  - follow up with sleep doctors for mask adjustment if needed.   - follow up as needed.  Thank you very much for the opportunity to participate in the care of this patient.  Please do not hesitate to call if any questions or  concerns arise.  No orders of the defined types were placed in this encounter.   Meds ordered this encounter  Medications  . Multiple Vitamins-Minerals (OCUVITE PRESERVISION PO)    Sig: Take by mouth.    Patient Instructions  - continue current medications - discuss with PCP about sleeping aid if you want to change medications - follow up with psychiatry for bipolar management - be cautions with medication labels  - get up and stand up slowly. Use cane if needed for balance.  - follow up with sleep doctors for mask adjustment if needed.   - follow up as needed.    Marvel Plan, MD PhD [facility] [address], [address] [place], [place] [address] [phone]

## 2017-06-16 NOTE — Patient Instructions (Addendum)
-   continue current medications - discuss with PCP about sleeping aid if you want to change medications - follow up with psychiatry for bipolar management - be cautions with medication labels  - get up and stand up slowly. Use cane if needed for balance.  - follow up with sleep doctors for mask adjustment if needed.   - follow up as needed.

## 2017-06-18 ENCOUNTER — Encounter: Payer: Self-pay | Admitting: Podiatry

## 2017-06-18 ENCOUNTER — Ambulatory Visit (INDEPENDENT_AMBULATORY_CARE_PROVIDER_SITE_OTHER): Payer: Medicare Other | Admitting: Podiatry

## 2017-06-18 DIAGNOSIS — M7751 Other enthesopathy of right foot: Secondary | ICD-10-CM

## 2017-06-18 DIAGNOSIS — M778 Other enthesopathies, not elsewhere classified: Secondary | ICD-10-CM

## 2017-06-18 DIAGNOSIS — M779 Enthesopathy, unspecified: Principal | ICD-10-CM

## 2017-06-18 NOTE — Progress Notes (Signed)
He presents today complaining of pain to the second and third metatarsophalangeal joints of the right foot. He also got a painful callus to the distal aspect of the fifth toe right foot.  Objective: Vital signs are stable alert and oriented 3. Pulses are palpable. Has pain on palpation and range of motion of the second and third metatarsophalangeal joints of the right foot. He also has a callus on the distal aspect of the fifth toe non-complicated.  Assessment: Capsulitis of the second metatarsophalangeal joint.  Plan: Debrided all reactive hyperkeratosis for him today also injected around the second and third metatarsophalangeal joints. Follow up with him as needed.

## 2017-06-25 ENCOUNTER — Ambulatory Visit: Payer: Medicare Other | Admitting: Podiatry

## 2017-07-02 ENCOUNTER — Encounter: Payer: Self-pay | Admitting: Podiatry

## 2017-07-02 ENCOUNTER — Ambulatory Visit (INDEPENDENT_AMBULATORY_CARE_PROVIDER_SITE_OTHER): Payer: Medicare Other | Admitting: Podiatry

## 2017-07-02 DIAGNOSIS — M7751 Other enthesopathy of right foot: Secondary | ICD-10-CM | POA: Diagnosis not present

## 2017-07-02 DIAGNOSIS — G5761 Lesion of plantar nerve, right lower limb: Secondary | ICD-10-CM | POA: Diagnosis not present

## 2017-07-02 DIAGNOSIS — M778 Other enthesopathies, not elsewhere classified: Secondary | ICD-10-CM

## 2017-07-02 DIAGNOSIS — M779 Enthesopathy, unspecified: Principal | ICD-10-CM

## 2017-07-02 MED ORDER — MELOXICAM 15 MG PO TABS: 15.0000 mg | ORAL_TABLET | Freq: Every day | ORAL | 3 refills | Status: DC

## 2017-07-02 NOTE — Progress Notes (Signed)
He presents today states that the cortisone injection actually made it worse seems to be hurting worse than it was on a week and a half ago. He is referring to his second metatarsophalangeal joint of the right foot.  Objective: Vital signs are stable he is alert and oriented 3 pulses are palpable. He has painful range of motion of the second metatarsophalangeal joint none on the third or fourth. It appears that the majority of the pain is located just within the second metatarsophalangeal joint both dorsally and plantarly. He also has pain on palpation to the intermetatarsal space second and third right foot.  Assessment: Capsulitis second metatarsophalangeal joint.  Plan: Discussed the possible need for surgical correction of this and possible removal of internal fixation. This point I would also consider injecting with dexamethasone which we did today after sterile Betadine skin prep directly into the joint itself and also initiated the alcohol injections to the second interdigital space. I will follow-up with him once he returns from [place].

## 2017-08-04 ENCOUNTER — Encounter: Payer: Self-pay | Admitting: Podiatry

## 2017-08-04 ENCOUNTER — Ambulatory Visit (INDEPENDENT_AMBULATORY_CARE_PROVIDER_SITE_OTHER): Payer: Medicare Other | Admitting: Podiatry

## 2017-08-04 DIAGNOSIS — G5761 Lesion of plantar nerve, right lower limb: Secondary | ICD-10-CM

## 2017-08-04 DIAGNOSIS — Q828 Other specified congenital malformations of skin: Secondary | ICD-10-CM

## 2017-08-04 DIAGNOSIS — M778 Other enthesopathies, not elsewhere classified: Secondary | ICD-10-CM

## 2017-08-04 DIAGNOSIS — M779 Enthesopathy, unspecified: Principal | ICD-10-CM

## 2017-08-04 DIAGNOSIS — M7751 Other enthesopathy of right foot: Secondary | ICD-10-CM | POA: Diagnosis not present

## 2017-08-05 NOTE — Progress Notes (Signed)
He presents today for call follow-up of capsulitis second metatarsophalangeal joint right foot he states that he did very good while he was in [place]. He states that he has significant hammertoe deformity of the third fourth and fifth hammer toes of the right foot and contracture deformity of the second metatarsophalangeal joint. I see ligating these fixed sometime this fall.  Objective: Vital signs are stable he is alert and oriented 3. Pulses are palpable. No longer has tenderness and swelling around the second metatarsophalangeal joint.  The toe is slightly dorsiflexed secondary to second metatarsal osteotomy and medially dislocated. Lesser toes #3 and #4 appeared to be developing adductive varus rotated hammertoe deformities and undercutting the straight second toe which is causing it to raise up. He also has tenderness and burning on the fifth toe of the right foot.  Assessment: Hammertoe deformity with capsulitis 2 through 5 right foot.  Plan: I will follow-up with him in the fall after he returns from her trip to [place] at which time radiographs will be taken and a consent will be signed for release of the second metatarsophalangeal joint removal of internal fixation second metatarsal right foot. Also hammertoe repair with screw #3 and #4 and a derotational arthroplasty fifth digit right foot.

## 2017-10-06 ENCOUNTER — Ambulatory Visit: Payer: Medicare Other | Admitting: Podiatry

## 2017-12-15 ENCOUNTER — Ambulatory Visit (INDEPENDENT_AMBULATORY_CARE_PROVIDER_SITE_OTHER): Payer: Medicare Other

## 2017-12-15 ENCOUNTER — Ambulatory Visit (INDEPENDENT_AMBULATORY_CARE_PROVIDER_SITE_OTHER): Payer: Medicare Other | Admitting: Podiatry

## 2017-12-15 ENCOUNTER — Encounter: Payer: Self-pay | Admitting: Podiatry

## 2017-12-15 DIAGNOSIS — M2041 Other hammer toe(s) (acquired), right foot: Secondary | ICD-10-CM

## 2017-12-15 DIAGNOSIS — M779 Enthesopathy, unspecified: Secondary | ICD-10-CM

## 2017-12-15 DIAGNOSIS — M7751 Other enthesopathy of right foot: Secondary | ICD-10-CM

## 2017-12-15 DIAGNOSIS — M778 Other enthesopathies, not elsewhere classified: Secondary | ICD-10-CM

## 2017-12-15 NOTE — Progress Notes (Signed)
He presents today with a chief concern of medial deviation of his toes at the level of the metatarsophalangeal joints.  He states that they seem to be moving over and they are really painful on the bottom.  Objective: Vital signs are stable he is alert and oriented x3.  Pulses are palpable.  He has obviously ruptured the joint that we worked on initially.  That was the second metatarsal phalangeal joint and a hammertoe to the second.  The joint is obviously ruptured at this point and allowing for dorsiflexion medial deviation of the second metatarsal phalangeal joint and the other digits are left unopposed and have now dislocated medially and dorsally.  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  Assessment: Dislocation of the lesser metatarsophalangeal joints.  With probable tear of the lateral collateral ligaments  Plan: At this point secondary to a possible tear of the capsules and the deviation of the toes and dislocations I feel is necessary to obtain an MRI for surgical evaluation.  I would also like for him to follow-up with Dr. Logan BoresEvans for surgical consult.  Dr. Logan BoresEvans and I will discuss surgical options together.

## 2017-12-18 NOTE — Addendum Note (Signed)
Addended by: Geraldine ContrasVENABLE, ANGELA D on: 12/18/2017 11:57 AM   Modules accepted: Orders

## 2017-12-22 ENCOUNTER — Ambulatory Visit
Admission: RE | Admit: 2017-12-22 | Discharge: 2017-12-22 | Disposition: A | Discharge status: Home / Self Care | Payer: Medicare Other | Source: Ambulatory Visit | Attending: Podiatry | Admitting: Podiatry

## 2017-12-22 DIAGNOSIS — M12871 Other specific arthropathies, not elsewhere classified, right ankle and foot: Secondary | ICD-10-CM | POA: Diagnosis not present

## 2017-12-22 DIAGNOSIS — R600 Localized edema: Secondary | ICD-10-CM | POA: Insufficient documentation

## 2017-12-22 DIAGNOSIS — M7751 Other enthesopathy of right foot: Secondary | ICD-10-CM | POA: Insufficient documentation

## 2017-12-22 DIAGNOSIS — M779 Enthesopathy, unspecified: Secondary | ICD-10-CM

## 2017-12-22 DIAGNOSIS — M899 Disorder of bone, unspecified: Secondary | ICD-10-CM | POA: Diagnosis not present

## 2017-12-22 DIAGNOSIS — M778 Other enthesopathies, not elsewhere classified: Secondary | ICD-10-CM

## 2017-12-31 ENCOUNTER — Encounter: Payer: Self-pay | Admitting: Podiatry

## 2017-12-31 ENCOUNTER — Ambulatory Visit (INDEPENDENT_AMBULATORY_CARE_PROVIDER_SITE_OTHER): Payer: Medicare Other | Admitting: Podiatry

## 2017-12-31 DIAGNOSIS — M7751 Other enthesopathy of right foot: Secondary | ICD-10-CM

## 2017-12-31 DIAGNOSIS — M778 Other enthesopathies, not elsewhere classified: Secondary | ICD-10-CM

## 2017-12-31 DIAGNOSIS — M779 Enthesopathy, unspecified: Principal | ICD-10-CM

## 2017-12-31 NOTE — Progress Notes (Signed)
He presents today for follow-up of his MRI report.  He states there is really been no change other than just chronic pain to the second and third metatarsal phalangeal joint of the right foot.  Objective: Vital signs are stable he is alert and oriented times 3-second and third toes medially deviate with hammertoe deformity to the third digit and an undercutting of the fourth digit.  MRI does demonstrate elongated second and third metatarsals without capsulitis and without tear.  Hammertoe deformities are noted.  Assessment: Medial deviation and most likely lateral tears of the joint capsule at the level of the metatarsal phalangeal joint.  Plan: I recommended that he consider surgical intervention but he states is going to take too long for it to heal and he may not consider surgery until next winter.  He would like to consider a new pair of orthotics.  At this point he was casted today for new set of orthotics.

## 2018-01-20 DIAGNOSIS — M722 Plantar fascial fibromatosis: Secondary | ICD-10-CM | POA: Diagnosis not present

## 2018-01-21 ENCOUNTER — Ambulatory Visit (INDEPENDENT_AMBULATORY_CARE_PROVIDER_SITE_OTHER): Payer: Medicare Other | Admitting: Orthotics

## 2018-01-21 DIAGNOSIS — M779 Enthesopathy, unspecified: Principal | ICD-10-CM

## 2018-01-21 DIAGNOSIS — M2041 Other hammer toe(s) (acquired), right foot: Secondary | ICD-10-CM

## 2018-01-21 DIAGNOSIS — G5761 Lesion of plantar nerve, right lower limb: Secondary | ICD-10-CM

## 2018-01-21 DIAGNOSIS — M778 Other enthesopathies, not elsewhere classified: Secondary | ICD-10-CM

## 2018-01-21 NOTE — Progress Notes (Signed)
Patient came in today to pick up custom made foot orthotics.  The goals were accomplished and the patient reported no dissatisfaction with said orthotics.  Patient was advised of breakin period and how to report any issues. 

## 2018-02-03 DIAGNOSIS — M722 Plantar fascial fibromatosis: Secondary | ICD-10-CM

## 2018-02-11 ENCOUNTER — Encounter: Payer: Self-pay | Admitting: Physical Therapy

## 2018-02-11 ENCOUNTER — Other Ambulatory Visit: Payer: Self-pay

## 2018-02-11 ENCOUNTER — Ambulatory Visit: Payer: Medicare Other | Attending: Family Medicine | Admitting: Physical Therapy

## 2018-02-11 DIAGNOSIS — M6281 Muscle weakness (generalized): Secondary | ICD-10-CM

## 2018-02-11 DIAGNOSIS — M542 Cervicalgia: Secondary | ICD-10-CM | POA: Diagnosis present

## 2018-02-11 DIAGNOSIS — M62838 Other muscle spasm: Secondary | ICD-10-CM | POA: Diagnosis not present

## 2018-02-12 NOTE — Therapy (Signed)
[facility] PHYSICAL AND SPORTS MEDICINE [address] [place], [place], [address] Phone: [phone]   Fax:  [phone]  Physical Therapy Evaluation  Patient Details  Name: Adrian Lewis MRN: [id number] Date of Birth: 12/03/1945 Referring Provider: Burman FreestoneMeeler, Whitney NP   Encounter Date: 02/11/2018  PT End of Session - 02/11/18 0839    Visit Number  1    Number of Visits  6    Date for PT Re-Evaluation  03/25/18    Authorization Type  1    Authorization Time Period  1 of 6 FOTO    PT Start Time  0825    PT Stop Time  0930    PT Time Calculation (min)  65 min    Activity Tolerance  Patient tolerated treatment well    Behavior During Therapy  [facility] for tasks assessed/performed       Past Medical History:  Diagnosis Date  . Anxiety   . Arthritis   . Bipolar 2 disorder (HCC)   . Depression   . Hypothyroidism   . Insomnia   . Macular degeneration disease   . Renal calculus, left   . Sleep apnea     Past Surgical History:  Procedure Laterality Date  . CATARACT EXTRACTION W/ INTRAOCULAR LENS IMPLANT  2011 (APPROX)   RIGHT EYE AND REPAIR DETACHED RETINA  . EYE SURGERY    . HAMMER TOE SURGERY  2012  &  2010  (APPROX)   ONE TOE , EACH FOOT  . HERNIA REPAIR Bilateral    Inguinal Hernia Repair  . JOINT REPLACEMENT Left 2014   Partial Knee Replacement  . KNEE SURGERY Left    x 2  . NASAL TURBINATE REDUCTION Bilateral 09/27/2015   Procedure: TURBINATE REDUCTION/SUBMUCOSAL RESECTION;  Surgeon: Geanie LoganPaul Bennett, MD;  Location: [facility];  Service: ENT;  Laterality: Bilateral;  . REPAIR RIGHT INGUINAL HERNIA W/ MESH  02-14-2006  . SEPTOPLASTY N/A 09/27/2015   Procedure: SEPTOPLASTY;  Surgeon: Geanie LoganPaul Bennett, MD;  Location: [facility];  Service: ENT;  Laterality: N/A;  . URETEROSCOPY  08/12/2012   Procedure: URETEROSCOPY;  Surgeon: Sebastian Acheheodore Manny, MD;  [facility];  Service: Urology;  Laterality: Left;  1 HR     There were no  vitals filed for this visit.   Subjective Assessment - 02/11/18 0832    Subjective  Patient reports he is currently improving from neck pain and continues with some difficulty with driving with turning  head right and left.     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 Patient reports history of neck pain that resolved on own.     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      Cognition   Overall Cognitive Status  Within Functional Limits for tasks assessed      Observation/Other Assessments   Focus on Therapeutic Outcomes (FOTO)   56/100      Posture/Postural Control   Posture Comments  moderate forward head, rounded shoulders and increased kyphosis: required 1 pillow to reach head to wall with standing posture      ROM / Strength   AROM / PROM / Strength  AROM;Strength      AROM   Overall AROM Comments  cervical spine flexion: 60; extension: 40; rotation right 45; left: 50; side bend right:35; left: 25; bilateral shoulder flexion: 130      Strength   Overall Strength Comments  grossly bilateral UE's major muscle groups WFL without pain      Palpation   Palpation  comment  muscle spasm right upper trapezius moderate and left mild, cervical spine paraspinal muslces with decresaed elasticity             Objective measurements completed on examination: See above findings.    Treatment : seated in massage chair: manual therapy: STM to cervical spine and Upper trapezius x 13 min. Superficial techniques Therapeutic exercise:  Chin tucks x 10 Scapular retraction x 10; at wall with 1 pillow behind head x 10 Pizza carry x 10 Supine AAROM bilateral shoulders x 10     Patient response to treatment: patient demonstrated improved technique with exercises with minimal VC for correct alignment.Patient with decreased spasms by 50% following STM. Improved motor control with repetition and cuing improve rotations 60 degrees right and left following STM    PT Education - 02/11/18 1550    Education provided  Yes    Education Details  POC, reviewed FOTO results: HEP shoulder ROM supine, chin tucks, wall scapular retraction at door frame, pizza carry    Person(s) Educated  Patient    Methods  Explanation;Demonstration;Verbal cues;Handout    Comprehension  Verbal cues required;Returned demonstration;Verbalized understanding          PT Long Term Goals - 02/11/18 1556      PT LONG TERM GOAL #1   Title  Patient will demonstrate improved function with daily tasks with less difficulty as indicated by FOTO score of 61/100    Baseline  FOTO 56/100    Status  New    Target Date  03/04/18      PT LONG TERM GOAL #2   Title  Patient will demonstrate improved function with daily tasks with less difficulty as indicated by FOTO score of 68/100    Baseline  FOTO 56/100    Status  New    Target Date  03/25/18      PT LONG TERM GOAL #3   Title  Patient will be independent with home program for pain control, exercise progression to allow transition to self management at discharge     Baseline  limited knowledge of pain control, progression of exercise without  assistance, cuing    Status  New    Target Date  03/25/18             Plan - 02/11/18 1002    Clinical  Impression Statement  patient is a 72 year old right hand dominant male who presents with spasms, decreased ROM and decreased strength cervical spine/back. He has FOTO score of 56/100 indicatiing moderate self perceived impairment. He is limited in his daily tasks with taking care of his orchard and has difficulty with driving with turning head to right/left. He has limited knowledge of appropriate exercises and progression in order to return to prior level of function    Clinical Presentation  Stable    Clinical Decision Making  Low    Rehab Potential  Good    Clinical Impairments Affecting Rehab Potential  (+)motivated, prior level of activity    PT Frequency  2x / week    PT Duration  6 weeks    PT Treatment/Interventions  Electrical Stimulation;Cryotherapy;Ultrasound;Moist Heat;Therapeutic activities;Therapeutic exercise;Patient/family education;Neuromuscular re-education;Manual techniques    PT Next Visit Plan  modalities as needed, manual soft tissue techniques, exercise progression for stabilization and scapular strengthening    PT Home Exercise Plan  posture: scapular retraction, chin tucks    Consulted and Agree with Plan of Care  Patient       Patient will benefit from skilled therapeutic intervention in order to improve the following deficits and impairments:  Improper body mechanics, Postural dysfunction, Increased muscle spasms, Decreased activity tolerance, Decreased endurance, Decreased range of motion, Decreased strength, Impaired perceived functional ability  Visit Diagnosis: Other muscle spasm  Cervicalgia  Muscle weakness (generalized)     Problem List Patient Active Problem List   Diagnosis Date Noted  . Medication overdose 06/16/2017  . Bipolar 1 disorder (HCC) 06/16/2017  . Primary insomnia 06/16/2017  . Anxiety 06/16/2017  . History of hernia  repair 05/14/2012    Adrian Lewis 02/12/2018, 4:01 PM  [facility] PHYSICAL AND SPORTS MEDICINE [address], [place], [address] Phone: [phone]   Fax:  [phone]  Name: Adrian Lewis MRN: [id number] Date of Birth: 29-Jan-1946

## 2018-02-16 ENCOUNTER — Ambulatory Visit: Payer: Medicare Other | Admitting: Physical Therapy

## 2018-02-18 ENCOUNTER — Ambulatory Visit: Payer: Medicare Other | Admitting: Physical Therapy

## 2018-02-18 ENCOUNTER — Encounter: Payer: Self-pay | Admitting: Physical Therapy

## 2018-02-18 DIAGNOSIS — M62838 Other muscle spasm: Secondary | ICD-10-CM

## 2018-02-18 DIAGNOSIS — M6281 Muscle weakness (generalized): Secondary | ICD-10-CM

## 2018-02-18 DIAGNOSIS — M542 Cervicalgia: Secondary | ICD-10-CM

## 2018-02-18 NOTE — Therapy (Signed)
[facility] [address], [place], [address] Phone: [phone]   Fax:  [phone]  Physical Therapy Treatment  Patient Details  Name: Adrian Lewis MRN: [id number] Date of Birth: 12/12/1945 Referring Provider: Burman Freestone NP   Encounter Date: 02/18/2018  PT End of Session - 02/18/18 0937    Visit Number  2    Number of Visits  6    Date for PT Re-Evaluation  03/25/18    Authorization Type     Authorization Time Period  2 of 6 FOTO    PT Start Time  0931    PT Stop Time  1015    PT Time Calculation (min)  44 min    Activity Tolerance  Patient tolerated treatment well    Behavior During Therapy  [facility] for tasks assessed/performed       Past Medical History:  Diagnosis Date  . Anxiety   . Arthritis   . Bipolar 2 disorder (HCC)   . Depression   . Hypothyroidism   . Insomnia   . Macular degeneration disease   . Renal calculus, left   . Sleep apnea     Past Surgical History:  Procedure Laterality Date  . CATARACT EXTRACTION W/ INTRAOCULAR LENS IMPLANT  2011 (APPROX)   RIGHT EYE AND REPAIR DETACHED RETINA  . EYE SURGERY    . HAMMER TOE SURGERY  2012  &  2010  (APPROX)   ONE TOE , EACH FOOT  . HERNIA REPAIR Bilateral    Inguinal Hernia Repair  . JOINT REPLACEMENT Left 2014   Partial Knee Replacement  . KNEE SURGERY Left    x 2  . NASAL TURBINATE REDUCTION Bilateral 09/27/2015   Procedure: TURBINATE REDUCTION/SUBMUCOSAL RESECTION;  Surgeon: Geanie Logan, MD;  Location: [facility];  Service: ENT;  Laterality: Bilateral;  . REPAIR RIGHT INGUINAL HERNIA W/ MESH  02-14-2006  . SEPTOPLASTY N/A 09/27/2015   Procedure: SEPTOPLASTY;  Surgeon: Geanie Logan, MD;  Location: [facility];  Service: ENT;  Laterality: N/A;  . URETEROSCOPY  08/12/2012   Procedure: URETEROSCOPY;  Surgeon: Sebastian Ache, MD;  [facility];  Service: Urology;  Laterality: Left;  1 HR     There were no  vitals filed for this visit.  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 Patient reports history of neck pain that resolved on own.     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  Yes    Pain Score  2     Pain Location  Neck    Pain Orientation  Left    Pain Descriptors / Indicators  Tightness    Pain Type  Chronic pain    Pain Onset  More than a month ago    Pain Frequency  Intermittent          Andy 9:30 Objective measurements completed on examination: See above findings.    Treatment : Manual Therapy: x 23 min. Goal: improve soft tissue elasticity,  pain seated in massage chair: STM to cervical spine and upper trapezius; supine:  suboccipital release  Superficial techniques  Therapeutic exercise: patient performed with demonstration, verbal cues of therapist: goal: improved strength, endurance, function for daily tasks Standing; Scapular retraction with black tubing at door x 10 Bilateral shoulder extension with black resistive tubing x 10 reps  Supine: AAROM both shoulders x 5 reps RS at 90 degrees elevation 3 sets x 10 reps (instructed in RS at home using 2# weight in hand for circles CW/CCW) Neutral rotations RS x 10 reps each shoulder   Cervical spine rotations facilitated by therapist x 3 reps right/left  Upper trapezius stretch x 3 reps each side 20 second holds   Patient response to treatment: improved soft tissue elasticity up to 50% following STM; patient performed exercise with improved technique following demonstration and with verbal cues and facilitation of  therapist.    PT Education - 02/18/18 0936    Education provided  Yes    Education Details  re assessed home program; added scapular retraction green band    Person(s) Educated  Patient    Methods  Explanation;Demonstration;Verbal cues;Handout    Comprehension  Verbal cues required;Returned demonstration;Verbalized understanding          PT Long Term Goals - 02/11/18 1556      PT LONG TERM GOAL #1   Title  Patient will demonstrate improved function with daily tasks with less difficulty as indicated by FOTO score of 61/100    Baseline  FOTO 56/100    Status  New    Target Date  03/04/18      PT LONG TERM GOAL #2   Title  Patient will demonstrate improved function with daily tasks with less difficulty as indicated by FOTO score of 68/100    Baseline  FOTO 56/100    Status  New    Target Date  03/25/18      PT LONG TERM GOAL #3   Title  Patient will be independent with home program for pain control, exercise progression to allow transition to self management at discharge     Baseline  limited knowledge of pain control, progression of exercise without assistance, cuing    Status  New    Target Date  03/25/18            Plan - 02/18/18 1031    Clinical Impression Statement  Patient is progressing towards goals with improving spasms and progressive exercises. He requires minimal cuing to perform exercises with improved technique.     Rehab Potential  Good    Clinical Impairments Affecting Rehab Potential  (+)motivated, prior level of activity    PT Frequency  2x / week    PT Duration  6 weeks    PT Treatment/Interventions  Electrical Stimulation;Cryotherapy;Ultrasound;Moist Heat;Therapeutic activities;Therapeutic exercise;Patient/family education;Neuromuscular re-education;Manual techniques    PT Next Visit Plan  modalities as needed, manual soft tissue techniques, exercise progression for stabilization and scapular strengthening    PT Home Exercise Plan  posture:  scapular retraction, chin tucks    Consulted and Agree with Plan of Care  Patient       Patient will benefit from skilled therapeutic intervention in order to improve the following deficits and impairments:  Improper body mechanics, Postural dysfunction, Increased muscle spasms, Decreased activity tolerance, Decreased endurance, Decreased range of motion, Decreased strength, Impaired perceived functional ability  Visit Diagnosis: Other muscle spasm  Cervicalgia  Muscle weakness (generalized)     Problem List Patient  Active Problem List   Diagnosis Date Noted  . Medication overdose 06/16/2017  . Bipolar 1 disorder (HCC) 06/16/2017  . Primary insomnia 06/16/2017  . Anxiety 06/16/2017  . History of hernia repair 05/14/2012    Beacher May PT 02/18/2018, 10:21 PM  [facility] PHYSICAL AND SPORTS MEDICINE [address], [place], [address] Phone: [phone]   Fax:  [phone]  Name: Adrian Lewis MRN: [id number] Date of Birth: 11-30-1945

## 2018-02-25 ENCOUNTER — Ambulatory Visit: Payer: Medicare Other | Attending: Family Medicine | Admitting: Physical Therapy

## 2018-02-25 ENCOUNTER — Encounter: Payer: Self-pay | Admitting: Physical Therapy

## 2018-02-25 DIAGNOSIS — M6281 Muscle weakness (generalized): Secondary | ICD-10-CM | POA: Insufficient documentation

## 2018-02-25 DIAGNOSIS — M542 Cervicalgia: Secondary | ICD-10-CM | POA: Diagnosis present

## 2018-02-25 DIAGNOSIS — M62838 Other muscle spasm: Secondary | ICD-10-CM | POA: Insufficient documentation

## 2018-02-25 NOTE — Therapy (Signed)
[facility]ICINE [address], [place], [address] Phone: [phone]   Fax:  [phone]  Physical Therapy Treatment  Patient Details  Name: Adrian Lewis MRN: [id number] Date of Birth: 07-May-1946 Referring Provider: Burman Freestone NP   Encounter Date: 02/25/2018  PT End of Session - 02/25/18 1607    Visit Number  3    Number of Visits  6    Date for PT Re-Evaluation  03/25/18    Authorization Time Period  1 of 6 FOTO    PT Start Time  0950    PT Stop Time  1020    PT Time Calculation (min)  30 min    Activity Tolerance  Patient tolerated treatment well    Behavior During Therapy  [facility] for tasks assessed/performed       Past Medical History:  Diagnosis Date  . Anxiety   . Arthritis   . Bipolar 2 disorder (HCC)   . Depression   . Hypothyroidism   . Insomnia   . Macular degeneration disease   . Renal calculus, left   . Sleep apnea     Past Surgical History:  Procedure Laterality Date  . CATARACT EXTRACTION W/ INTRAOCULAR LENS IMPLANT  2011 (APPROX)   RIGHT EYE AND REPAIR DETACHED RETINA  . EYE SURGERY    . HAMMER TOE SURGERY  2012  &  2010  (APPROX)   ONE TOE , EACH FOOT  . HERNIA REPAIR Bilateral    Inguinal Hernia Repair  . JOINT REPLACEMENT Left 2014   Partial Knee Replacement  . KNEE SURGERY Left    x 2  . NASAL TURBINATE REDUCTION Bilateral 09/27/2015   Procedure: TURBINATE REDUCTION/SUBMUCOSAL RESECTION;  Surgeon: Geanie Logan, MD;  Location: [facility];  Service: ENT;  Laterality: Bilateral;  . REPAIR RIGHT INGUINAL HERNIA W/ MESH  02-14-2006  . SEPTOPLASTY N/A 09/27/2015   Procedure: SEPTOPLASTY;  Surgeon: Geanie Logan, MD;  Location: [facility];  Service: ENT;  Laterality: N/A;  . URETEROSCOPY  08/12/2012   Procedure: URETEROSCOPY;  Surgeon: Sebastian Ache, MD;  Location: [facility];  Service: Urology;  Laterality: Left;  1 HR     There were no vitals filed for this  visit.  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 Patient reports history of neck pain that resolved on own.     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  Yes    Pain Score  2     Pain Location  Neck    Pain Orientation  Left    Pain Descriptors / Indicators  Tightness    Pain Type  Chronic pain    Pain Onset  More than a month ago    Pain Frequency  Intermittent         Treatment : Manual Therapy: x 15 min. Goal: improve soft tissue elasticity, pain seated in massage chair: STM to cervical spine and upper trapezius; supine:  suboccipital release  Superficial techniques   Therapeutic exercise: patient performed with demonstration, verbal cues of therapist: goal: improved strength, endurance, function for daily tasks   Supine: AAROM both shoulders x 5 reps RS at 90 degrees elevation 3 sets x 10 reps (instructed in RS at home using 2# weight in hand for circles CW/CCW) Neutral rotations RS x 10 reps each shoulder   Cervical spine rotations facilitated by therapist x 3 reps right/left  Upper trapezius stretch x 3 reps each side 20 second holds    Patient response to treatment: improved soft tissue elasticity up to 50% following STM; patient performed exercise with improved technique with verbal cues and facilitation of therapist.    PT Education - 02/25/18 1102    Education provided  Yes    Education Details  exercise instruction and what to work on in gym    Person(s) Educated  Patient    Methods  Explanation    Comprehension  Verbalized  understanding          PT Long Term Goals - 02/11/18 1556      PT LONG TERM GOAL #1   Title  Patient will demonstrate improved function with daily tasks with less difficulty as indicated by FOTO score of 61/100    Baseline  FOTO 56/100    Status  New    Target Date  03/04/18      PT LONG TERM GOAL #2   Title  Patient will demonstrate improved function with daily tasks with less difficulty as indicated by FOTO score of 68/100    Baseline  FOTO 56/100    Status  New    Target Date  03/25/18      PT LONG TERM GOAL #3   Title  Patient will be independent with home program for pain control, exercise progression to allow transition to self management at discharge     Baseline  limited knowledge of pain control, progression of exercise without assistance, cuing    Status  New    Target Date  03/25/18            Plan - 02/25/18 1044    Clinical Impression Statement  Patient arrived late and therefore had limited session. He is progressing well with goals and improving soft tissue elasticity with current treatment. He continues to require guidance and verbal cuing to perform exercises with proper technqiue and will benefit from continued physical therapy intervention.     Rehab Potential  Good    Clinical Impairments Affecting Rehab Potential  (+)motivated, prior level of activity    PT Frequency  2x / week    PT Duration  6 weeks    PT Treatment/Interventions  Electrical Stimulation;Cryotherapy;Ultrasound;Moist Heat;Therapeutic activities;Therapeutic exercise;Patient/family education;Neuromuscular re-education;Manual techniques    PT Next Visit Plan  modalities as needed, manual soft tissue techniques, exercise progression for stabilization and scapular strengthening    PT Home Exercise Plan  posture: scapular retraction, chin tucks       Patient will benefit from skilled therapeutic intervention in order to improve the following deficits and impairments:  Improper body  mechanics, Postural dysfunction, Increased muscle spasms, Decreased activity tolerance, Decreased endurance, Decreased range of motion, Decreased strength, Impaired perceived functional ability  Visit Diagnosis: Other muscle spasm  Cervicalgia  Muscle weakness (generalized)     Problem List Patient Active Problem List   Diagnosis Date Noted  . Medication overdose 06/16/2017  . Bipolar 1 disorder (HCC) 06/16/2017  . Primary insomnia 06/16/2017  . Anxiety 06/16/2017  .  History of hernia repair 05/14/2012    Beacher MayBrooks, Marie PT 02/26/2018, 12:07 AM  [facility] [address] [place], [place], [address] Phone: [phone]   Fax:  [phone]  Name: Adrian Lewis MRN: [id number] Date of Birth: Feb 04, 1946

## 2018-03-04 ENCOUNTER — Ambulatory Visit: Payer: Medicare Other | Admitting: Physical Therapy

## 2018-03-04 ENCOUNTER — Encounter: Payer: Self-pay | Admitting: Physical Therapy

## 2018-03-04 DIAGNOSIS — M6281 Muscle weakness (generalized): Secondary | ICD-10-CM

## 2018-03-04 DIAGNOSIS — M62838 Other muscle spasm: Secondary | ICD-10-CM | POA: Diagnosis not present

## 2018-03-04 DIAGNOSIS — M542 Cervicalgia: Secondary | ICD-10-CM

## 2018-03-04 NOTE — Therapy (Signed)
[facility] [address] [place], [place], [address] Phone: [phone]   Fax:  [phone]  Physical Therapy Treatment  Patient Details  Name: Adrian Lewis MRN: [id number] Date of Birth: Aug 13, 1946 Referring Provider: Burman FreestoneMeeler, Whitney NP   Encounter Date: 03/04/2018  PT End of Session - 03/04/18 0947    Visit Number  4    Number of Visits  6    Date for PT Re-Evaluation  03/25/18    Authorization Time Period  4 of 6 FOTO    PT Start Time  [id number]    PT Stop Time  1028    PT Time Calculation (min)  44 min    Activity Tolerance  Patient tolerated treatment well    Behavior During Therapy  [facility] assessed/performed       Past Medical History:  Diagnosis Date  . Anxiety   . Arthritis   . Bipolar 2 disorder (HCC)   . Depression   . Hypothyroidism   . Insomnia   . Macular degeneration disease   . Renal calculus, left   . Sleep apnea     Past Surgical History:  Procedure Laterality Date  . CATARACT EXTRACTION W/ INTRAOCULAR LENS IMPLANT  2011 (APPROX)   RIGHT EYE AND REPAIR DETACHED RETINA  . EYE SURGERY    . HAMMER TOE SURGERY  2012  &  2010  (APPROX)   ONE TOE , EACH FOOT  . HERNIA REPAIR Bilateral    Inguinal Hernia Repair  . JOINT REPLACEMENT Left 2014   Partial Knee Replacement  . KNEE SURGERY Left    x 2  . NASAL TURBINATE REDUCTION Bilateral 09/27/2015   Procedure: TURBINATE REDUCTION/SUBMUCOSAL RESECTION;  Surgeon: Geanie LoganPaul Bennett, MD;  Location: [facility];  Service: ENT;  Laterality: Bilateral;  . REPAIR RIGHT INGUINAL HERNIA W/ MESH  02-14-2006  . SEPTOPLASTY N/A 09/27/2015   Procedure: SEPTOPLASTY;  Surgeon: Geanie LoganPaul Bennett, MD;  Location: [facility];  Service: ENT;  Laterality: N/A;  . URETEROSCOPY  08/12/2012   Procedure: URETEROSCOPY;  Surgeon: Sebastian Acheheodore Manny, MD;  [facility];  Service: Urology;  Laterality: Left;  1 HR     There were no vitals filed for this  visit.  Subjective Assessment - 03/04/18 0944    Subjective  Patient reports he is a little bit better that the previous session and he is trying to lie on floor with head and back straight with increased soreness, stiffness.    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 Patient reports history of neck pain that resolved on own.     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  Yes    Pain Score  1     Pain Location  Neck    Pain Orientation  Left    Pain Descriptors / Indicators  Tightness    Pain Type  Chronic pain    Pain Onset  More than a month ago    Pain Frequency  Intermittent         Objective:  palpation: decreased soft tissue mobility suboccipitally, SCM's, upper trapezius muscles right>left posture: moderate forward head posture  Treatment:  Manual  Therapy: x 23 min. Goal: improve soft tissue elasticity, pain supine lying:  STM to cervical spine and upper trapezius, SCM's bulaterally with superficial techniques; supine: suboccipital release     Therapeutic exercise: patient performed with demonstration, verbal cues of therapist: goal: improved strength, endurance, function for daily tasks   Supine: with one pillow + folded towel  AAROM both shoulders x 5 reps RS at 90 degrees elevation 3 sets x 10 reps Neutral rotations RS 3 x 10 reps each shoulder   Cervical spine side bending facilitated by therapist x 5-10 reps right/left  1 pillow + 1 folded towel under head: cervical spine retraction with isometric extension x 5 reps pectoral stretch with LTR 3 x 10 seconds each UE with assistance of therapist for positioning of UE     Patient response to treatment: improved soft tissue elasticity up to 50% following STM; patient  performed exercise with improved technique with verbal cues and facilitation of therapist.     PT Education - 03/04/18 1050    Education provided  Yes    Education Details  exercise instruction for stretching pectoral muscles, cervical spine retraction with isometric extension    Person(s) Educated  Patient    Methods  Explanation;Demonstration;Verbal cues    Comprehension  Verbalized understanding;Returned demonstration;Verbal cues required          PT Long Term Goals - 02/11/18 1556      PT LONG TERM GOAL #1   Title  Patient will demonstrate improved function with daily tasks with less difficulty as indicated by FOTO score of 61/100    Baseline  FOTO 56/100    Status  New    Target Date  03/04/18      PT LONG TERM GOAL #2   Title  Patient will demonstrate improved function with daily tasks with less difficulty as indicated by FOTO score of 68/100    Baseline  FOTO 56/100    Status  New    Target Date  03/25/18      PT LONG TERM GOAL #3   Title  Patient will be independent with home program for pain control, exercise progression to allow transition to self management at discharge     Baseline  limited knowledge of pain control, progression of exercise without assistance, cuing    Status  New    Target Date  03/25/18            Plan - 03/04/18 1051    Clinical Impression Statement  Patient demonstrated improved soft issue elasticity in cervical spine/SCM's with improved cervical spine position with decreased forward head posture. He is progressing steadily towards goals and should continue to improve with additional physical therapy intervention.     Rehab Potential  Good    Clinical Impairments Affecting Rehab Potential  (+)motivated, prior level of activity    PT Frequency  2x / week    PT Duration  6 weeks    PT Treatment/Interventions  Electrical Stimulation;Cryotherapy;Ultrasound;Moist Heat;Therapeutic activities;Therapeutic exercise;Patient/family  education;Neuromuscular re-education;Manual techniques    PT Next Visit Plan  modalities as needed, manual soft tissue techniques, exercise progression for stabilization and scapular strengthening    PT Home Exercise Plan  posture: scapular retraction, chin tucks       Patient will benefit from skilled therapeutic intervention in order to improve the following deficits and impairments:  Improper body mechanics, Postural dysfunction, Increased muscle spasms, Decreased activity tolerance, Decreased endurance, Decreased range of motion, Decreased strength, Impaired perceived functional ability  Visit Diagnosis: Other muscle spasm  Cervicalgia  Muscle weakness (generalized)     Problem List Patient Active Problem List   Diagnosis Date Noted  . Medication overdose 06/16/2017  . Bipolar 1 disorder (HCC) 06/16/2017  . Primary insomnia 06/16/2017  . Anxiety 06/16/2017  . History of hernia repair 05/14/2012    Beacher May PT 03/04/2018, 10:54 AM  [facility] PHYSICAL AND SPORTS MEDICINE [address] [place], [address] Phone: [phone]   Fax:  [phone]  Name: Adrian Lewis MRN: [id number] Date of Birth: 1946/08/16

## 2018-03-18 ENCOUNTER — Encounter: Payer: Self-pay | Admitting: Physical Therapy

## 2018-03-18 ENCOUNTER — Ambulatory Visit: Payer: Medicare Other | Admitting: Physical Therapy

## 2018-03-18 DIAGNOSIS — M542 Cervicalgia: Secondary | ICD-10-CM

## 2018-03-18 DIAGNOSIS — M62838 Other muscle spasm: Secondary | ICD-10-CM

## 2018-03-18 DIAGNOSIS — M6281 Muscle weakness (generalized): Secondary | ICD-10-CM

## 2018-03-18 NOTE — Therapy (Signed)
[facility] [address] [place], [place], [address] Phone: [phone]   Fax:  [phone]  Physical Therapy Treatment  Patient Details  Name: Hoyle Barrndrew J Cellucci MRN: [id number] Date of Birth: 12-17-1945 Referring Provider: Burman FreestoneMeeler, Whitney NP   Encounter Date: 03/18/2018  PT End of Session - 03/18/18 0905    Visit Number  5    Number of Visits  6    Date for PT Re-Evaluation  03/25/18    Authorization Time Period  5 of 6 FOTO    PT Start Time  0858    PT Stop Time  0944    PT Time Calculation (min)  46 min    Activity Tolerance  Patient tolerated treatment well    Behavior During Therapy  [facility] for [facility] assessed/performed       Past Medical History:  Diagnosis Date  . Anxiety   . Arthritis   . Bipolar 2 disorder (HCC)   . Depression   . Hypothyroidism   . Insomnia   . Macular degeneration disease   . Renal calculus, left   . Sleep apnea     Past Surgical History:  Procedure Laterality Date  . CATARACT EXTRACTION W/ INTRAOCULAR LENS IMPLANT  2011 (APPROX)   RIGHT EYE AND REPAIR DETACHED RETINA  . EYE SURGERY    . HAMMER TOE SURGERY  2012  &  2010  (APPROX)   ONE TOE , EACH FOOT  . HERNIA REPAIR Bilateral    Inguinal Hernia Repair  . JOINT REPLACEMENT Left 2014   Partial Knee Replacement  . KNEE SURGERY Left    x 2  . NASAL TURBINATE REDUCTION Bilateral 09/27/2015   Procedure: TURBINATE REDUCTION/SUBMUCOSAL RESECTION;  Surgeon: Geanie LoganPaul Bennett, MD;  Location: [facility];  Service: ENT;  Laterality: Bilateral;  . REPAIR RIGHT INGUINAL HERNIA W/ MESH  02-14-2006  . SEPTOPLASTY N/A 09/27/2015   Procedure: SEPTOPLASTY;  Surgeon: Geanie LoganPaul Bennett, MD;  Location: [facility];  Service: ENT;  Laterality: N/A;  . URETEROSCOPY  08/12/2012   Procedure: URETEROSCOPY;  Surgeon: Sebastian Acheheodore Manny, MD;  [facility];  Service: Urology;  Laterality: Left;  1 HR     There were no vitals filed for this  visit.  Subjective Assessment - 03/18/18 0902    Subjective  Patient reports he is doing better with less neck pain and stiffness. He is having lower back stiffness at the moment.     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 Patient reports history of neck pain that resolved on own.     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  Other (Comment) pain and stiffness in lower back        Objective:  palpation: spasm and increased tenderness along bilateral paraspinal muscles mid to lower back posture: moderate forward head posture, improving   Treatment:  Manual Therapy: x 12 min. Goal: improve soft tissue elasticity, pain supine lying:  STM to lower thoracic, lumbar spine bilaterally with superficial techniques;   Therapeutic exercise: patient performed with demonstration, verbal cues of therapist: goal: improved strength, endurance, function for daily tasks   Supine: with one pillow under head  AAROM both  shoulders x 5 reps RS at 90 degrees elevation 3 sets x 10 reps Neutral rotations RS 3 x 10 reps each shoulder    sitting:  hip adduction with ball and glute sets x 15 reps hip abduction seated with green resistive band x 15  side lying: clamshells demonstrated   standing: side stepping along airex balance beam x 2 min. with green resistive band around thighs diagonal wedding march holding 4# dumbbells in hands x 2 min.   Patient response to treatment: improved soft tissue elasticity up to 50% following STM; patient performed exercise with improved technique with demonstration, verbal cues and facilitation of therapist.     PT Education - 03/18/18 1000    Education provided  Yes    Education Details  exercise instruction     Person(s) Educated  Patient    Methods  Explanation;Demonstration;Verbal cues;Handout    Comprehension  Verbal cues required;Returned demonstration;Verbalized understanding          PT Long Term Goals - 02/11/18 1556      PT LONG TERM GOAL #1   Title  Patient will demonstrate improved function with daily tasks with less difficulty as indicated by FOTO score of 61/100    Baseline  FOTO 56/100    Status  New    Target Date  03/04/18      PT LONG TERM GOAL #2   Title  Patient will demonstrate improved function with daily tasks with less difficulty as indicated by FOTO score of 68/100    Baseline  FOTO 56/100    Status  New    Target Date  03/25/18      PT LONG TERM GOAL #3   Title  Patient will be independent with home program for pain control, exercise progression to allow transition to self management at discharge     Baseline  limited knowledge of pain control, progression of exercise without assistance, cuing    Status  New    Target Date  03/25/18            Plan - 03/18/18 1000    Clinical Impression Statement  Patient is progress well towards goals with decreasing pain in cervical spine and progression with exercises. He should continue to improve with additional physical therapy intervention.     Rehab Potential  Good    Clinical Impairments Affecting Rehab Potential  (+)motivated, prior level of activity    PT Frequency  2x / week    PT Duration  6 weeks    PT Treatment/Interventions  Electrical Stimulation;Cryotherapy;Ultrasound;Moist Heat;Therapeutic activities;Therapeutic exercise;Patient/family education;Neuromuscular re-education;Manual techniques    PT Next Visit Plan  modalities as needed, manual soft tissue techniques, exercise progression for stabilization and scapular strengthening    PT Home Exercise Plan  posture: scapular retraction, chin tucks       Patient will benefit from skilled therapeutic intervention in order to improve the following  deficits and impairments:  Improper body mechanics, Postural dysfunction, Increased muscle spasms, Decreased activity tolerance, Decreased endurance, Decreased range of motion, Decreased strength, Impaired perceived functional ability  Visit Diagnosis: Other muscle spasm  Cervicalgia  Muscle weakness (generalized)     Problem List Patient Active Problem List   Diagnosis Date Noted  . Medication overdose 06/16/2017  . Bipolar 1 disorder (HCC) 06/16/2017  . Primary insomnia 06/16/2017  . Anxiety 06/16/2017  . History of hernia repair 05/14/2012    Beacher May PT 03/18/2018, 10:08 PM  [facility] PHYSICAL [facility]ORTS  MEDICINE [address], [place], [address] Phone: [phone]   Fax:  [phone]  Name: CADARIUS NEVARES MRN: [id number] Date of Birth: Jun 27, 1946

## 2018-03-25 ENCOUNTER — Ambulatory Visit: Payer: Medicare Other | Attending: Family Medicine | Admitting: Physical Therapy

## 2018-03-25 ENCOUNTER — Encounter: Payer: Self-pay | Admitting: Physical Therapy

## 2018-03-25 DIAGNOSIS — M6281 Muscle weakness (generalized): Secondary | ICD-10-CM

## 2018-03-25 DIAGNOSIS — M542 Cervicalgia: Secondary | ICD-10-CM | POA: Diagnosis present

## 2018-03-25 DIAGNOSIS — M62838 Other muscle spasm: Secondary | ICD-10-CM | POA: Insufficient documentation

## 2018-03-25 NOTE — Therapy (Signed)
[facility] [address], [place], [address] Phone: [phone]   Fax:  [phone]  Physical Therapy Treatment/Discharge Summary  Patient Details  Name: Adrian Lewis MRN: [id number] Date of Birth: January 31, 1946 Referring Provider: Allene Dillon NP   Encounter Date: 03/25/2018   Patient began physical therapy on 02/11/2018 and has attended 6 sessions through 03/25/2018. He has achieved/partially met goals and is independent in home program for continued self management of pain/symptoms and exercises as instructed. Plan discharge from physical therapy at this time.    PT End of Session - 03/25/18 1046    Visit Number  6    Number of Visits  6    Date for PT Re-Evaluation  03/25/18    Authorization Time Period  6 of 6 FOTO    PT Start Time  0950    PT Stop Time  1040    PT Time Calculation (min)  50 min    Activity Tolerance  Patient tolerated treatment well    Behavior During Therapy  [facility] for tasks assessed/performed       Past Medical History:  Diagnosis Date  . Anxiety   . Arthritis   . Bipolar 2 disorder ([place])   . Depression   . Hypothyroidism   . Insomnia   . Macular degeneration disease   . Renal calculus, left   . Sleep apnea     Past Surgical History:  Procedure Laterality Date  . CATARACT EXTRACTION W/ INTRAOCULAR LENS IMPLANT  2011 (APPROX)   RIGHT EYE AND REPAIR DETACHED RETINA  . EYE SURGERY    . HAMMER TOE SURGERY  2012  &  2010  (APPROX)   ONE TOE , EACH FOOT  . HERNIA REPAIR Bilateral    Inguinal Hernia Repair  . JOINT REPLACEMENT Left 2014   Partial Knee Replacement  . KNEE SURGERY Left    x 2  . NASAL TURBINATE REDUCTION Bilateral 09/27/2015   Procedure: TURBINATE REDUCTION/SUBMUCOSAL RESECTION;  Surgeon: Clyde Canterbury, MD;  Location: [facility];  Service: ENT;  Laterality: Bilateral;  . REPAIR RIGHT INGUINAL HERNIA W/ MESH  02-14-2006  . SEPTOPLASTY N/A 09/27/2015   Procedure:  SEPTOPLASTY;  Surgeon: Clyde Canterbury, MD;  Location: [facility];  Service: ENT;  Laterality: N/A;  . URETEROSCOPY  08/12/2012   Procedure: URETEROSCOPY;  Surgeon: Alexis Frock, MD;  Location[facility];  Service: Urology;  Laterality: Left;  1 HR     There were no vitals filed for this visit.  Subjective Assessment - 03/25/18 0958    Subjective  Patieint reports he is doing better with neck and exercises. He feels he is ready to continue independently with self managment and will continue with training in gym, chiropractic care and massage therapy.     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 Patient reports history of neck pain that resolved on own.     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  No/denies        Objective:  palpation: spasms palpable cervical spine and left upper trapezius posture: moderate forward head posture, improved with cuing Initial: AROM: cervical spine flexion: 60; extension: 40; rotation  right 45; left: 50; side bend right:35; left: 25 Current AROM cervical spine flexion 60; extension 35, with cervical retraction up to 50; rotations 60 degrees right and left; side bend 35 degrees right and left FOTO initially 56/100: current 61/100  Treatment:  ManualTherapy: [email or web address].Goal: improve soft tissue elasticity, pain Sitting: STM to bilateral upper trapezius muscles, cervical spine musculature superficial and compression techniques   Therapeutic exercise:patient performed with demonstration, verbal cues of therapist: goal: improved strength, endurance, function for daily tasks  Re assessed home program:  Scapular retraction with resistive bands high/low rows Hip adduction with ball and  glute sets Hip abduction with resistive band Walk diagonal with weights side lying clamshells Cervical spine ROM: cervical retraction, retraction with extension, rotations and side bending  Cervical retraction with extension using towel for support behind neck; performed 5 reps with demonstration and VC  Pulleys seated with back to pulleys: demonstrated by therapist and performed by patient for ROM flexibility  Patient response to treatment:improved soft tissue elasticity cervical spine and upper trapezius muscles by 50% following STM, patient verbalized good understanding of home exercises     PT Education - 03/25/18 1040    Education provided  Yes    Education Details  HEP re assessed    Person(s) Educated  Patient    Methods  Explanation;Demonstration;Verbal cues;Handout    Comprehension  Verbalized understanding;Returned demonstration;Verbal cues required          PT Long Term Goals - 03/25/18 1100      PT LONG TERM GOAL #1   Title  Patient will demonstrate improved function with daily tasks with less difficulty as indicated by FOTO score of 61/100    Baseline  FOTO 56/100; 61/100 03/25/2018    Status  Achieved      PT LONG TERM GOAL #2   Title  Patient will demonstrate improved function with daily tasks with less difficulty as indicated by FOTO score of 68/100    Baseline  FOTO 56/100; 61/100 03/25/2018     Status  Partially Met      PT LONG TERM GOAL #3   Title  Patient will be independent with home program for pain control, exercise progression to allow transition to self management at discharge     Baseline  limited knowledge of pain control, progression of exercise without assistance, cuing    Status  Achieved            Plan - 03/25/18 1047    Clinical Impression Statement  Patient demonstrates significant improvement in Cervical spine AROM and posture. He has achieved all goals and is ready for discharge to independent home program and self management.      Rehab Potential  Good    Clinical Impairments Affecting Rehab Potential  (+)motivated, prior level of activity    PT Frequency  2x / week    PT Duration  6 weeks    PT Treatment/Interventions  Electrical Stimulation;Cryotherapy;Ultrasound;Moist Heat;Therapeutic activities;Therapeutic exercise;Patient/family education;Neuromuscular re-education;Manual techniques    PT Next Visit Plan  discharge from physical therapy    PT Home Exercise Plan  posture: scapular retraction, chin tucks, pulleys for ROM       Patient will benefit from skilled therapeutic intervention in order to improve the following deficits and impairments:  Improper body mechanics, Postural dysfunction, Increased muscle spasms, Decreased activity tolerance, Decreased endurance, Decreased range of motion, Decreased strength, Impaired perceived functional ability  Visit Diagnosis: Other muscle spasm  Cervicalgia  Muscle weakness (generalized)  Problem List Patient Active Problem List   Diagnosis Date Noted  . Medication overdose 06/16/2017  . Bipolar 1 disorder ([place]) 06/16/2017  . Primary insomnia 06/16/2017  . Anxiety 06/16/2017  . History of hernia repair 05/14/2012    Adrian Lewis PT 03/25/2018, 11:00 PM  [facility] [address] [place], [address] Phone: [phone]   Fax:  [phone]  Name: Adrian Lewis MRN: [id number] Date of Birth: 05-26-1946

## 2018-04-08 ENCOUNTER — Encounter: Payer: Medicare Other | Admitting: Physical Therapy

## 2018-04-15 ENCOUNTER — Encounter: Payer: Medicare Other | Admitting: Physical Therapy

## 2018-04-22 ENCOUNTER — Encounter: Payer: Medicare Other | Admitting: Physical Therapy

## 2018-04-29 ENCOUNTER — Encounter: Payer: Medicare Other | Admitting: Physical Therapy

## 2018-05-06 ENCOUNTER — Encounter: Payer: Medicare Other | Admitting: Physical Therapy

## 2018-05-13 ENCOUNTER — Encounter: Payer: Medicare Other | Admitting: Physical Therapy

## 2018-05-20 ENCOUNTER — Encounter: Payer: Medicare Other | Admitting: Physical Therapy

## 2018-06-24 IMAGING — MR MR HEAD W/O CM
11 series · 48 of 48 positions shown · non-contrast
Comparison: Head CT 06/10/2017

ADDENDUM:
There is moderate ethmoid sinus mucosal thickening.
CLINICAL DATA: Altered mental status.  Unsteady gait.

EXAM:
MRI HEAD WITHOUT CONTRAST
TECHNIQUE: Multiplanar, multiecho pulse sequences of the brain and surrounding
structures were obtained without intravenous contrast.

[Series 2: T1 · sagittal · 5.0mm · 0.45mm/px · 3 of 26 slices shown]
[im 1/26]
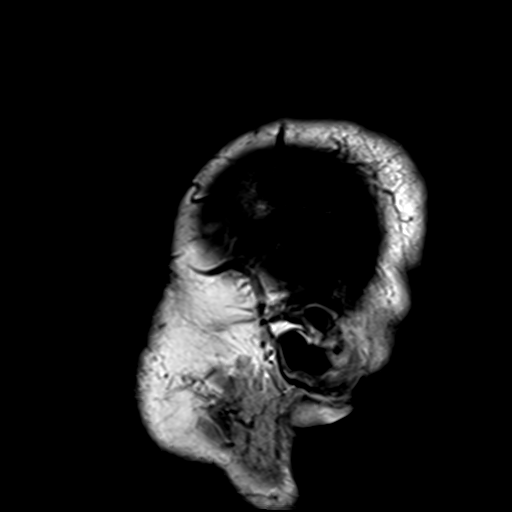
[im 13/26]
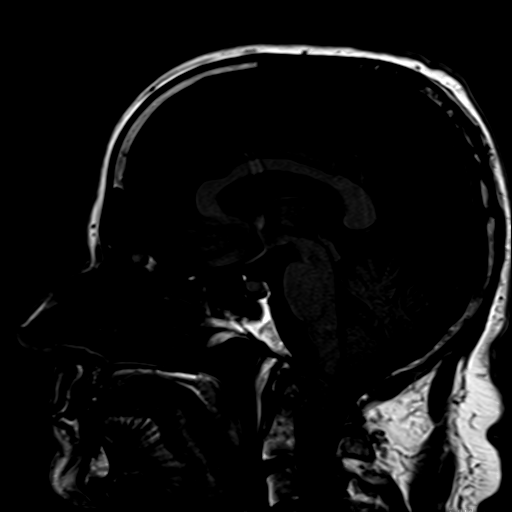
[im 26/26]
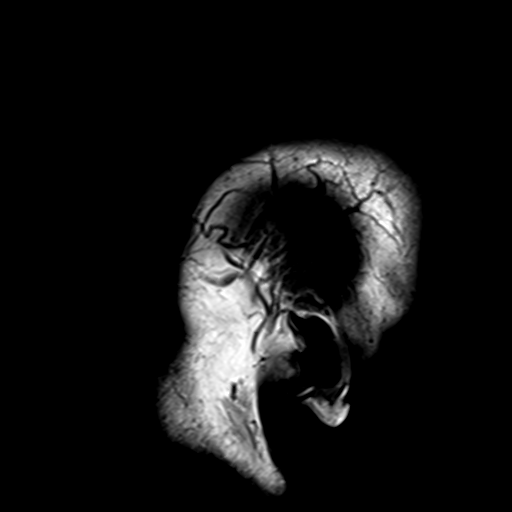

[Series 3: DWI · axial · 3.0mm · 1.80mm/px · z∈[-75,+77]mm · 14 of 156 slices shown (1 of 3)]
[im 1/156]
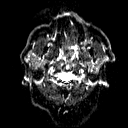
[im 12/156]
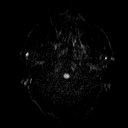
[im 24/156]
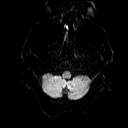
[im 36/156]
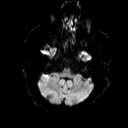
[im 48/156]
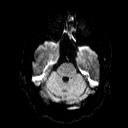
[im 60/156]
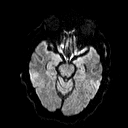
[im 72/156]
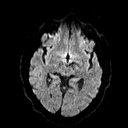
[im 84/156]
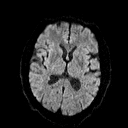
[im 96/156]
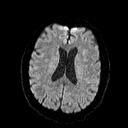
[im 108/156]
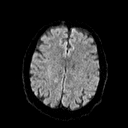
[im 120/156]
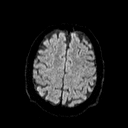
[im 132/156]
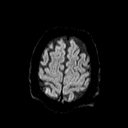
[im 144/156]
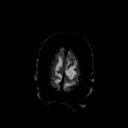
[im 156/156]
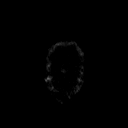

[Series 4: DWI · axial · 3.0mm · 1.80mm/px · z∈[-75,+77]mm · 5 of 52 slices shown (2 of 3)]
[im 1/52]
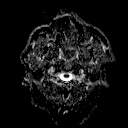
[im 13/52]
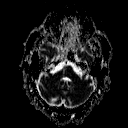
[im 26/52]
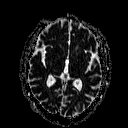
[im 39/52]
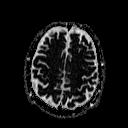
[im 52/52]
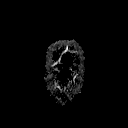

[Series 6: DWI · coronal · 3.0mm · 1.80mm/px · 4 of 43 slices shown (3 of 3)]
[im 1/43]
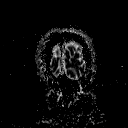
[im 15/43]
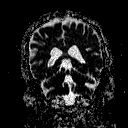
[im 29/43]
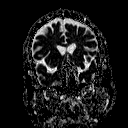
[im 43/43]
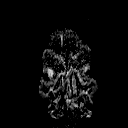

[Series 7: T2 · axial · 5.0mm · 0.60mm/px · z∈[-77,+66]mm · 2 of 23 slices shown (1 of 3)]
[im 1/23]
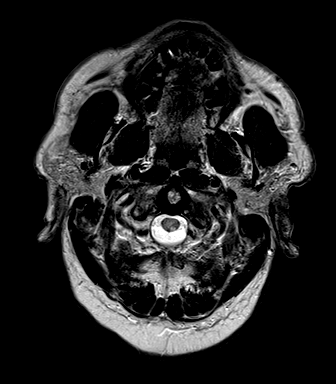
[im 23/23]
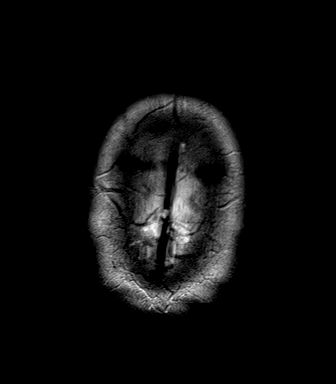

[Series 8: FLAIR · axial · 3.0mm · 0.45mm/px · z∈[-79,+68]mm · 5 of 50 slices shown]
[im 1/50]
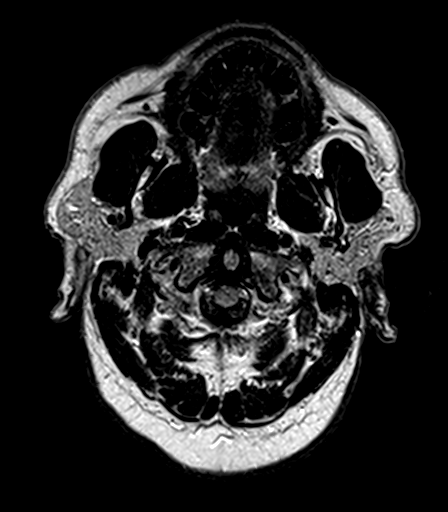
[im 13/50]
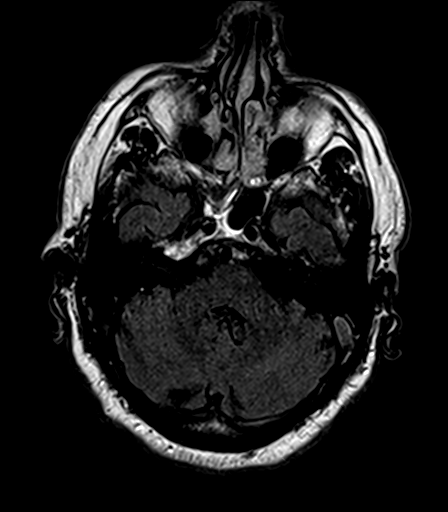
[im 25/50]
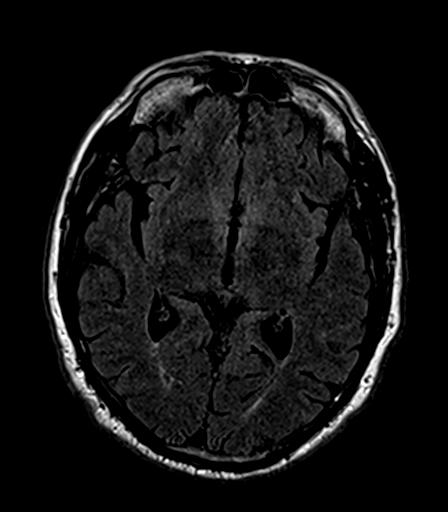
[im 37/50]
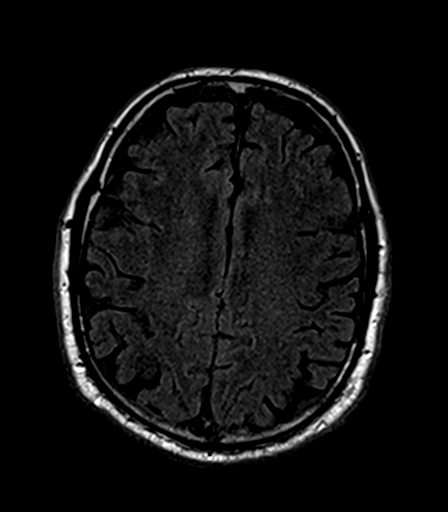
[im 50/50]
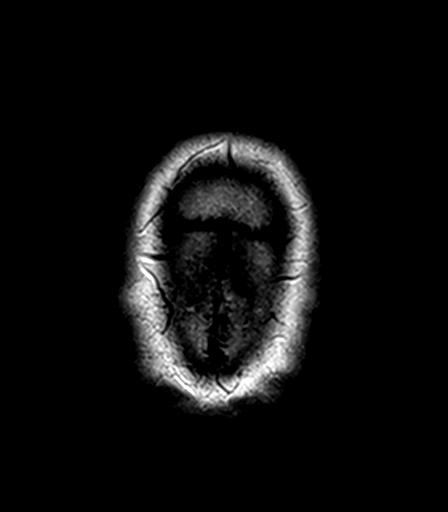

[Series 9: T2 · axial · 5.0mm · 0.45mm/px · z∈[-77,+66]mm · 2 of 23 slices shown (2 of 3)]
[im 1/23]
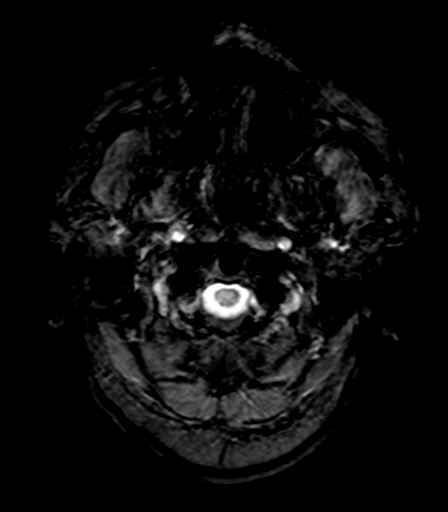
[im 23/23]
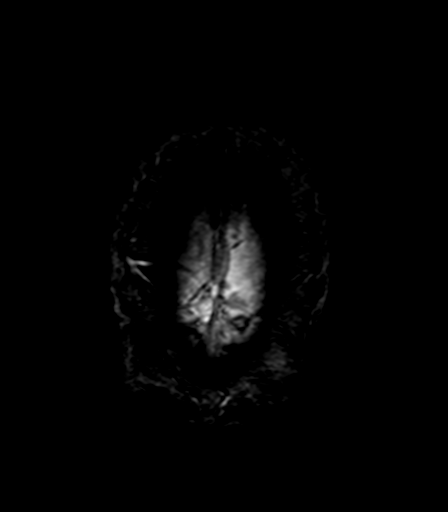

[Series 10: GRE · axial · 5.0mm · 0.45mm/px · z∈[-80,+75]mm · 2 of 25 slices shown]
[im 1/25]
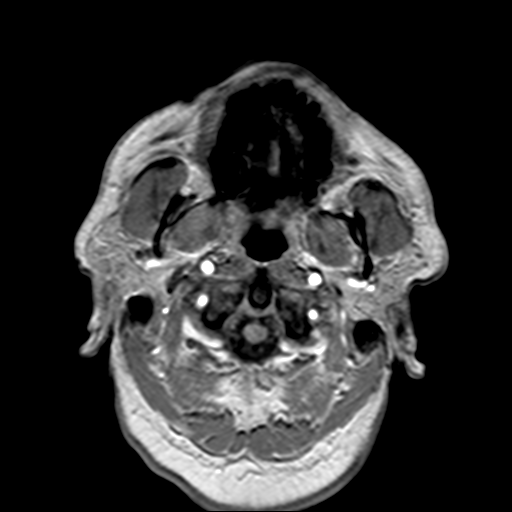
[im 25/25]
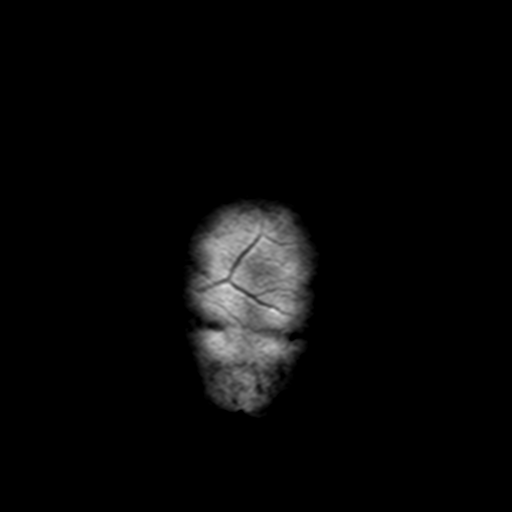

[Series 11: T2 · coronal · 5.0mm · 0.49mm/px · 2 of 27 slices shown (3 of 3)]
[im 1/27]
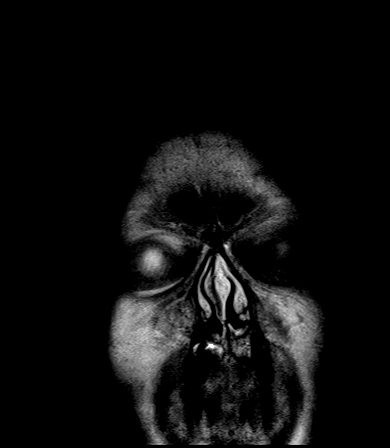
[im 27/27]
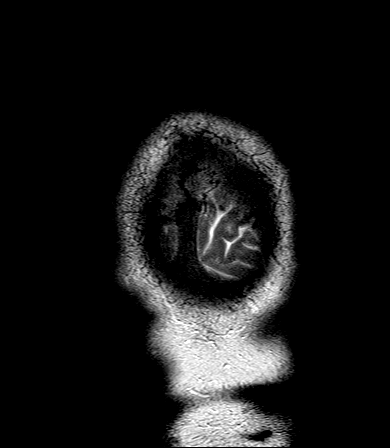

[Series 100: ax (id) · axial · 3.0mm · 1.80mm/px · z∈[-75,+77]mm · 5 of 52 slices shown]
[im 1/52]
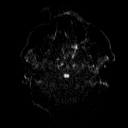
[im 13/52]
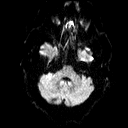
[im 26/52]
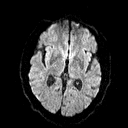
[im 39/52]
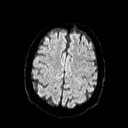
[im 52/52]
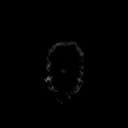

[Series 101: cor (id) · coronal · 3.0mm · 1.80mm/px · 4 of 43 slices shown]
[im 1/43]
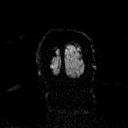
[im 15/43]
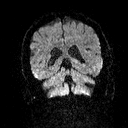
[im 29/43]
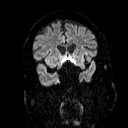
[im 43/43]
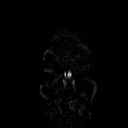

[48 of 48 positions shown; findings below may reference images not displayed]

FINDINGS: Brain: The midline structures are normal. There is no focal
diffusion restriction to indicate acute infarct. There is mild
hyperintense T2-weighted signal within the periventricular white
matter, most often seen in the setting of chronic microvascular
ischemia. No intraparenchymal hematoma or chronic microhemorrhage.
Brain volume is normal for age without age-advanced or lobar
predominant atrophy. The dura is normal and there is no extra-axial
collection.

Vascular: Major intracranial arterial and venous sinus flow voids
are preserved.

Skull and upper cervical spine: The visualized skull base,
calvarium, upper cervical spine and extracranial soft tissues are
normal.

Sinuses/Orbits: No fluid levels or advanced mucosal thickening. No
mastoid effusion. Normal orbits.
IMPRESSION: 1. No acute intracranial abnormality.
2. Mild findings of chronic microvascular disease for age.

## 2019-12-14 ENCOUNTER — Ambulatory Visit: Payer: Medicare Other | Attending: Internal Medicine

## 2019-12-14 DIAGNOSIS — Z23 Encounter for immunization: Secondary | ICD-10-CM | POA: Insufficient documentation

## 2019-12-14 NOTE — Progress Notes (Signed)
   Covid-19 Vaccination Clinic  Name:  Adrian Lewis    MRN: [id number] DOB: 05/18/1946  12/14/2019  [name] was observed post Covid-19 immunization for 15 minutes without incidence. He was provided with Vaccine Information Sheet and instruction to access the V-Safe system.   [name] was instructed to call 911 with any severe reactions post vaccine: [name] Difficulty breathing  . Swelling of your face and throat  . A fast heartbeat  . A bad rash all over your body  . Dizziness and weakness    Immunizations Administered    Name Date Dose VIS Date Route   Pfizer COVID-19 Vaccine 12/14/2019  1:51 PM 0.3 mL 11/05/2019 Intramuscular   Manufacturer: [employer], [employer]   Lot: [id number]   NDC: [address]-2

## 2020-01-04 ENCOUNTER — Ambulatory Visit: Payer: Medicare Other

## 2020-01-04 ENCOUNTER — Ambulatory Visit: Payer: Medicare Other | Attending: Internal Medicine

## 2020-01-04 DIAGNOSIS — Z23 Encounter for immunization: Secondary | ICD-10-CM | POA: Insufficient documentation

## 2020-01-04 NOTE — Progress Notes (Signed)
   Covid-19 Vaccination Clinic  Name:  Adrian Lewis    MRN: [id number] DOB: Apr 06, 1946  01/04/2020  [name] was observed post Covid-19 immunization for 15 minutes without incidence. He was provided with Vaccine Information Sheet and instruction to access the V-Safe system.   [name] was instructed to call 911 with any severe reactions post vaccine: [name] Difficulty breathing  . Swelling of your face and throat  . A fast heartbeat  . A bad rash all over your body  . Dizziness and weakness    Immunizations Administered    Name Date Dose VIS Date Route   Pfizer COVID-19 Vaccine 01/04/2020  3:49 PM 0.3 mL 11/05/2019 Intramuscular   Manufacturer: [employer], [employer]   Lot: [id number]   NDC: [address]-7

## 2020-01-25 DIAGNOSIS — N200 Calculus of kidney: Secondary | ICD-10-CM | POA: Insufficient documentation

## 2020-01-25 HISTORY — DX: Calculus of kidney: N20.0

## 2020-12-19 DIAGNOSIS — I10 Essential (primary) hypertension: Secondary | ICD-10-CM

## 2020-12-19 DIAGNOSIS — R9431 Abnormal electrocardiogram [ECG] [EKG]: Secondary | ICD-10-CM

## 2020-12-19 DIAGNOSIS — R0602 Shortness of breath: Secondary | ICD-10-CM

## 2020-12-19 DIAGNOSIS — E782 Mixed hyperlipidemia: Secondary | ICD-10-CM

## 2020-12-19 HISTORY — DX: Mixed hyperlipidemia: E78.2

## 2020-12-19 HISTORY — DX: Shortness of breath: R06.02

## 2020-12-19 HISTORY — DX: Abnormal electrocardiogram (ECG) (EKG): R94.31

## 2020-12-19 HISTORY — DX: Essential (primary) hypertension: I10

## 2021-02-09 ENCOUNTER — Other Ambulatory Visit: Payer: Self-pay | Admitting: Urology

## 2021-02-12 NOTE — Patient Instructions (Signed)
DUE TO COVID-19 ONLY ONE VISITOR IS ALLOWED TO COME WITH YOU AND STAY IN THE WAITING ROOM ONLY DURING PRE OP AND PROCEDURE DAY OF SURGERY. TWO  VISITOR  MAY VISIT WITH YOU AFTER SURGERY IN YOUR PRIVATE ROOM DURING VISITING HOURS ONLY!  YOU NEED TO HAVE A COVID 19 TEST ON__3-29-22_____ @_______ , THIS TEST MUST BE DONE BEFORE SURGERY,  COVID TESTING SITE [address] [place] , IT IS ON THE RIGHT GOING OUT [address] APPROXIMATELY  2 MINUTES PAST [employer] ON THE RIGHT. ONCE YOUR COVID TEST IS COMPLETED,  PLEASE BEGIN THE QUARANTINE INSTRUCTIONS AS OUTLINED IN YOUR HANDOUT.                Adrian Lewis  02/12/2021   Your procedure is scheduled on: 02-23-21   Report to [facility] Main  Entrance   Report to admitting at        0530  AM     Call this number if you have problems the morning of surgery [phone]    Remember: Do not eat food  :After Midnight.  You may have clear liquids until                0430 am then nothing by mouth    CLEAR LIQUID DIET                                                                 Black Coffee and tea, regular and decaf                              Plain Jell-O any favor except red or purple                                            Fruit ices (not with fruit pulp)                                     Iced Popsicles                                   Carbonated beverages, regular and diet                                    Cranberry, grape and apple juices Sports drinks like Gatorade Lightly seasoned clear broth or consume(fat free) Sugar, honey syrup _____________________________________________________________________    BRUSH YOUR TEETH MORNING OF SURGERY AND RINSE YOUR MOUTH OUT, NO CHEWING GUM CANDY OR MINTS.     Take these medicines the morning of surgery with A SIP OF WATER: levothyroxine,lamotrigine, wellbutrin, amlodipine, lithium                                 You may not have any  metal on your body including hair pins  and              piercings  Do not wear jewelry, lotions, powders or perfumes, deodorant                       Men may shave face and neck.   Do not bring valuables to the hospital. [facility] IS NOT             RESPONSIBLE   FOR VALUABLES.  Contacts, dentures or bridgework may not be worn into surgery.      Patients discharged the day of surgery will not be allowed to drive home. IF YOU ARE HAVING SURGERY AND GOING HOME THE SAME DAY, YOU MUST HAVE AN ADULT TO DRIVE YOU HOME AND BE WITH YOU FOR 24 HOURS. YOU MAY GO HOME BY TAXI OR UBER OR ORTHERWISE, BUT AN ADULT MUST ACCOMPANY YOU HOME AND STAY WITH YOU FOR 24 HOURS.  Name and phone number of your driver:  Special Instructions: N/A              Please read over the following fact sheets you were given: _____________________________________________________________________  [facility] - Preparing for Surgery Before surgery, you can play an important role.  Because skin is not sterile, your skin needs to be as free of germs as possible.  You can reduce the number of germs on your skin by washing with CHG (chlorahexidine gluconate) soap before surgery.  CHG is an antiseptic cleaner which kills germs and bonds with the skin to continue killing germs even after washing. Please DO NOT use if you have an allergy to CHG or antibacterial soaps.  If your skin becomes reddened/irritated stop using the CHG and inform your nurse when you arrive at Short Stay. Do not shave (including legs and underarms) for at least 48 hours prior to the first CHG shower.  You may shave your face/neck. Please follow these instructions carefully:  1.  Shower with CHG Soap the night before surgery and the  morning of Surgery.  2.  If you choose to wash your hair, wash your hair first as usual with your  normal  shampoo.  3.  After you shampoo, rinse your hair and body thoroughly to remove the  shampoo.                           4.   Use CHG as you would any other liquid soap.  You can apply chg directly  to the skin and wash                       Gently with a scrungie or clean washcloth.  5.  Apply the CHG Soap to your body ONLY FROM THE NECK DOWN.   Do not use on face/ open                           Wound or open sores. Avoid contact with eyes, ears mouth and genitals (private parts).                       Wash face,  Genitals (private parts) with your normal soap.             6.  Wash thoroughly, paying special attention to the area where your surgery  will be performed.  7.  Thoroughly rinse your  body with warm water from the neck down.  8.  DO NOT shower/wash with your normal soap after using and rinsing off  the CHG Soap.                9.  Pat yourself dry with a clean towel.            10.  Wear clean pajamas.            11.  Place clean sheets on your bed the night of your first shower and do not  sleep with pets. Day of Surgery : Do not apply any lotions/deodorants the morning of surgery.  Please wear clean clothes to the hospital/surgery center.  FAILURE TO FOLLOW THESE INSTRUCTIONS MAY RESULT IN THE CANCELLATION OF YOUR SURGERY PATIENT SIGNATURE_________________________________  NURSE SIGNATURE__________________________________  ________________________________________________________________________ [facility] - Preparing for Surgery Before surgery, you can play an important role.  Because skin is not sterile, your skin needs to be as free of germs as possible.  You can reduce the number of germs on your skin by washing with CHG (chlorahexidine gluconate) soap before surgery.  CHG is an antiseptic cleaner which kills germs and bonds with the skin to continue killing germs even after washing. Please DO NOT use if you have an allergy to CHG or antibacterial soaps.  If your skin becomes reddened/irritated stop using the CHG and inform your nurse when you arrive at Short Stay. Do not shave (including legs and  underarms) for at least 48 hours prior to the first CHG shower.  You may shave your face/neck. Please follow these instructions carefully:  1.  Shower with CHG Soap the night before surgery and the  morning of Surgery.  2.  If you choose to wash your hair, wash your hair first as usual with your  normal  shampoo.  3.  After you shampoo, rinse your hair and body thoroughly to remove the  shampoo.                           4.  Use CHG as you would any other liquid soap.  You can apply chg directly  to the skin and wash                       Gently with a scrungie or clean washcloth.  5.  Apply the CHG Soap to your body ONLY FROM THE NECK DOWN.   Do not use on face/ open                           Wound or open sores. Avoid contact with eyes, ears mouth and genitals (private parts).                       Wash face,  Genitals (private parts) with your normal soap.             6.  Wash thoroughly, paying special attention to the area where your surgery  will be performed.  7.  Thoroughly rinse your body with warm water from the neck down.  8.  DO NOT shower/wash with your normal soap after using and rinsing off  the CHG Soap.                9.  Pat yourself dry with a clean towel.  10.  Wear clean pajamas.            11.  Place clean sheets on your bed the night of your first shower and do not  sleep with pets. Day of Surgery : Do not apply any lotions/deodorants the morning of surgery.  Please wear clean clothes to the hospital/surgery center.  FAILURE TO FOLLOW THESE INSTRUCTIONS MAY RESULT IN THE CANCELLATION OF YOUR SURGERY PATIENT SIGNATURE_________________________________  NURSE SIGNATURE__________________________________  ________________________________________________________________________

## 2021-02-15 ENCOUNTER — Encounter (HOSPITAL_COMMUNITY)
Admission: RE | Admit: 2021-02-15 | Discharge: 2021-02-15 | Disposition: A | Discharge status: Home / Self Care | Payer: Medicare Other | Source: Ambulatory Visit | Attending: Urology | Admitting: Urology

## 2021-02-15 ENCOUNTER — Other Ambulatory Visit: Payer: Self-pay

## 2021-02-15 ENCOUNTER — Encounter (HOSPITAL_COMMUNITY): Payer: Self-pay

## 2021-02-15 DIAGNOSIS — Z01812 Encounter for preprocedural laboratory examination: Secondary | ICD-10-CM | POA: Insufficient documentation

## 2021-02-15 HISTORY — DX: Gastro-esophageal reflux disease without esophagitis: K21.9

## 2021-02-15 HISTORY — DX: Personal history of urinary calculi: Z87.442

## 2021-02-15 HISTORY — DX: Essential (primary) hypertension: I10

## 2021-02-15 LAB — BASIC METABOLIC PANEL
Anion gap: 7 (ref 5–15)
BUN: 18 mg/dL (ref 8–23)
CO2: 23 mmol/L (ref 22–32)
Calcium: 9.3 mg/dL (ref 8.9–10.3)
Chloride: 112 mmol/L — ABNORMAL HIGH (ref 98–111)
Creatinine, Ser: 0.78 mg/dL (ref 0.61–1.24)
GFR, Estimated: >60 mL/min (ref >60)
Glucose, Bld: 108 mg/dL — ABNORMAL HIGH (ref 70–99)
Potassium: 4.4 mmol/L (ref 3.5–5.1)
Sodium: 142 mmol/L (ref 135–145)

## 2021-02-15 LAB — CBC
HCT: 44 % (ref 39.0–52.0)
Hemoglobin: 14.6 g/dL (ref 13.0–17.0)
MCH: 32.5 pg (ref 26.0–34.0)
MCHC: 33.2 g/dL (ref 30.0–36.0)
MCV: 98 fL (ref 80.0–100.0)
Platelets: 253 10*3/uL (ref 150–400)
RBC: 4.49 MIL/uL (ref 4.22–5.81)
RDW: 13.5 % (ref 11.5–15.5)
WBC: 10.3 10*3/uL (ref 4.0–10.5)
nRBC: 0 % (ref 0.0–0.2)

## 2021-02-15 NOTE — Progress Notes (Addendum)
PCP - Dorothey Baseman, MD Cardiologist - Theron Arista Arnoldo Hooker 01-30-21 epic  PPM/ICD -  Device Orders -  Rep Notified -   Chest x-ray -  EKG - 12-19-20 on chart Stress Test - 01-04-21 epic care everywhere ECHO - 01-23-21  Cardiac Cath -   Sleep Study -  CPAP - YES  Fasting Blood Sugar -  Checks Blood Sugar _____ times a day  Blood Thinner Instructions: Aspirin Instructions:  ERAS Protcol - PRE-SURGERY Ensure or G2-   COVID TEST- 02-20-21  Activity -works on farm Systems analyst at [employer]  No SOB  Anesthesia review: OSA, HTN  Patient denies shortness of breath, fever, cough and chest pain at PAT appointment   NONE   All instructions explained to the patient, with a verbal understanding of the material. Patient agrees to go over the instructions while at home for a better understanding. Patient also instructed to self quarantine after being tested for COVID-19. The opportunity to ask questions was provided.

## 2021-02-20 ENCOUNTER — Other Ambulatory Visit (HOSPITAL_COMMUNITY)
Admission: RE | Admit: 2021-02-20 | Discharge: 2021-02-20 | Disposition: A | Discharge status: Home / Self Care | Payer: Medicare Other | Source: Ambulatory Visit | Attending: Urology | Admitting: Urology

## 2021-02-20 DIAGNOSIS — Z20822 Contact with and (suspected) exposure to covid-19: Secondary | ICD-10-CM | POA: Diagnosis not present

## 2021-02-20 DIAGNOSIS — Z01812 Encounter for preprocedural laboratory examination: Secondary | ICD-10-CM | POA: Diagnosis present

## 2021-02-20 LAB — SARS CORONAVIRUS 2 (TAT 6-24 HRS): SARS Coronavirus 2: NEGATIVE

## 2021-02-22 MED ORDER — GENTAMICIN SULFATE 40 MG/ML IJ SOLN
5.0000 mg/kg | INTRAVENOUS | Status: AC
  Administered 2021-02-23: 390 mg via INTRAVENOUS
  Filled 2021-02-22: qty 9.75

## 2021-02-23 ENCOUNTER — Ambulatory Visit (HOSPITAL_COMMUNITY): Payer: Medicare Other | Admitting: Certified Registered Nurse Anesthetist

## 2021-02-23 ENCOUNTER — Encounter (HOSPITAL_COMMUNITY): Payer: Self-pay | Admitting: Urology

## 2021-02-23 ENCOUNTER — Encounter (HOSPITAL_COMMUNITY)
Admission: RE | Disposition: A | Discharge status: Home / Self Care | Payer: Self-pay | Source: Home / Self Care | Attending: Urology

## 2021-02-23 ENCOUNTER — Ambulatory Visit (HOSPITAL_COMMUNITY)
Admission: RE | Admit: 2021-02-23 | Discharge: 2021-02-23 | Disposition: A | Discharge status: Home / Self Care | Payer: Medicare Other | Attending: Urology | Admitting: Urology

## 2021-02-23 ENCOUNTER — Ambulatory Visit (HOSPITAL_COMMUNITY): Payer: Medicare Other

## 2021-02-23 DIAGNOSIS — I10 Essential (primary) hypertension: Secondary | ICD-10-CM | POA: Insufficient documentation

## 2021-02-23 DIAGNOSIS — R82991 Hypocitraturia: Secondary | ICD-10-CM | POA: Diagnosis not present

## 2021-02-23 DIAGNOSIS — E785 Hyperlipidemia, unspecified: Secondary | ICD-10-CM | POA: Insufficient documentation

## 2021-02-23 DIAGNOSIS — N200 Calculus of kidney: Secondary | ICD-10-CM | POA: Diagnosis present

## 2021-02-23 DIAGNOSIS — G4730 Sleep apnea, unspecified: Secondary | ICD-10-CM | POA: Insufficient documentation

## 2021-02-23 DIAGNOSIS — Z419 Encounter for procedure for purposes other than remedying health state, unspecified: Secondary | ICD-10-CM

## 2021-02-23 HISTORY — PX: CYSTOSCOPY WITH RETROGRADE PYELOGRAM, URETEROSCOPY AND STENT PLACEMENT: SHX5789

## 2021-02-23 HISTORY — PX: HOLMIUM LASER APPLICATION: SHX5852

## 2021-02-23 SURGERY — CYSTOURETEROSCOPY, WITH RETROGRADE PYELOGRAM AND STENT INSERTION
Anesthesia: General | Site: Ureter | Laterality: Bilateral

## 2021-02-23 MED ORDER — ACETAMINOPHEN 10 MG/ML IV SOLN
INTRAVENOUS | Status: AC
  Filled 2021-02-23: qty 100

## 2021-02-23 MED ORDER — KETOROLAC TROMETHAMINE 10 MG PO TABS: 10.0000 mg | ORAL_TABLET | Freq: Three times a day (TID) | ORAL | 0 refills | Status: DC | PRN

## 2021-02-23 MED ORDER — FENTANYL CITRATE (PF) 100 MCG/2ML IJ SOLN
INTRAMUSCULAR | Status: DC | PRN
  Administered 2021-02-23: 50 ug via INTRAVENOUS
  Administered 2021-02-23 (×2): 25 ug via INTRAVENOUS

## 2021-02-23 MED ORDER — ORAL CARE MOUTH RINSE: 15.0000 mL | Freq: Once | OROMUCOSAL | Status: AC

## 2021-02-23 MED ORDER — SODIUM CHLORIDE 0.9 % IR SOLN
Status: DC | PRN
  Administered 2021-02-23: 6000 mL

## 2021-02-23 MED ORDER — LACTATED RINGERS IV SOLN: INTRAVENOUS | Status: DC

## 2021-02-23 MED ORDER — FENTANYL CITRATE (PF) 100 MCG/2ML IJ SOLN: 25.0000 ug | INTRAMUSCULAR | Status: DC | PRN

## 2021-02-23 MED ORDER — CEPHALEXIN 500 MG PO CAPS: 500.0000 mg | ORAL_CAPSULE | Freq: Two times a day (BID) | ORAL | 0 refills | Status: DC

## 2021-02-23 MED ORDER — LIDOCAINE 2% (20 MG/ML) 5 ML SYRINGE
INTRAMUSCULAR | Status: DC | PRN
  Administered 2021-02-23: 100 mg via INTRAVENOUS

## 2021-02-23 MED ORDER — ONDANSETRON HCL 4 MG/2ML IJ SOLN
INTRAMUSCULAR | Status: AC
  Filled 2021-02-23: qty 2

## 2021-02-23 MED ORDER — DEXAMETHASONE SODIUM PHOSPHATE 10 MG/ML IJ SOLN
INTRAMUSCULAR | Status: DC | PRN
  Administered 2021-02-23: 5 mg via INTRAVENOUS

## 2021-02-23 MED ORDER — ACETAMINOPHEN 10 MG/ML IV SOLN
1000.0000 mg | Freq: Once | INTRAVENOUS | Status: DC | PRN
  Administered 2021-02-23: 1000 mg via INTRAVENOUS

## 2021-02-23 MED ORDER — SENNOSIDES-DOCUSATE SODIUM 8.6-50 MG PO TABS: 1.0000 | ORAL_TABLET | Freq: Two times a day (BID) | ORAL | 0 refills | Status: DC

## 2021-02-23 MED ORDER — OXYCODONE-ACETAMINOPHEN 5-325 MG PO TABS: 1.0000 | ORAL_TABLET | Freq: Three times a day (TID) | ORAL | 0 refills | Status: DC | PRN

## 2021-02-23 MED ORDER — IOHEXOL 300 MG/ML  SOLN
INTRAMUSCULAR | Status: DC | PRN
  Administered 2021-02-23: 7 mL

## 2021-02-23 MED ORDER — ONDANSETRON HCL 4 MG/2ML IJ SOLN
INTRAMUSCULAR | Status: DC | PRN
  Administered 2021-02-23: 4 mg via INTRAVENOUS

## 2021-02-23 MED ORDER — FENTANYL CITRATE (PF) 100 MCG/2ML IJ SOLN
INTRAMUSCULAR | Status: AC
  Filled 2021-02-23: qty 2

## 2021-02-23 MED ORDER — PROPOFOL 10 MG/ML IV BOLUS
INTRAVENOUS | Status: DC | PRN
  Administered 2021-02-23: 200 mg via INTRAVENOUS

## 2021-02-23 MED ORDER — LIDOCAINE 2% (20 MG/ML) 5 ML SYRINGE
INTRAMUSCULAR | Status: AC
  Filled 2021-02-23: qty 5

## 2021-02-23 MED ORDER — OXYCODONE HCL 5 MG/5ML PO SOLN
5.0000 mg | Freq: Once | ORAL | Status: DC | PRN
Start: 2021-02-23 — End: 2021-02-23

## 2021-02-23 MED ORDER — CHLORHEXIDINE GLUCONATE 0.12 % MT SOLN
15.0000 mL | Freq: Once | OROMUCOSAL | Status: AC
  Administered 2021-02-23: 15 mL via OROMUCOSAL

## 2021-02-23 MED ORDER — OXYCODONE HCL 5 MG PO TABS: 5.0000 mg | ORAL_TABLET | Freq: Once | ORAL | Status: DC | PRN

## 2021-02-23 MED ORDER — PROPOFOL 10 MG/ML IV BOLUS
INTRAVENOUS | Status: AC
  Filled 2021-02-23: qty 20

## 2021-02-23 SURGICAL SUPPLY — 24 items
BAG URO CATCHER STRL LF (MISCELLANEOUS) ×3 IMPLANT
BASKET LASER NITINOL 1.9FR (BASKET) ×1 IMPLANT
BSKT STON RTRVL 120 1.9FR (BASKET) ×2
CATH INTERMIT  6FR 70CM (CATHETERS) ×3 IMPLANT
CLOTH BEACON ORANGE TIMEOUT ST (SAFETY) ×3 IMPLANT
EXTRACTOR STONE 1.7FRX115CM (UROLOGICAL SUPPLIES) IMPLANT
GLOVE SURG ENC TEXT LTX SZ7.5 (GLOVE) ×3 IMPLANT
GOWN STRL REUS W/TWL LRG LVL3 (GOWN DISPOSABLE) ×3 IMPLANT
GUIDEWIRE ANG ZIPWIRE 038X150 (WIRE) ×4 IMPLANT
GUIDEWIRE STR DUAL SENSOR (WIRE) ×4 IMPLANT
KIT TURNOVER KIT A (KITS) ×3 IMPLANT
LASER FIB FLEXIVA PULSE ID 365 (Laser) IMPLANT
LASER FIB FLEXIVA PULSE ID 550 (Laser) ×1 IMPLANT
LASER FIB FLEXIVA PULSE ID 910 (Laser) IMPLANT
MANIFOLD NEPTUNE II (INSTRUMENTS) ×3 IMPLANT
PACK CYSTO (CUSTOM PROCEDURE TRAY) ×3 IMPLANT
SHEATH URETERAL 12FRX28CM (UROLOGICAL SUPPLIES) IMPLANT
SHEATH URETERAL 12FRX35CM (MISCELLANEOUS) ×1 IMPLANT
STENT POLARIS 5FRX24 (STENTS) ×2 IMPLANT
TRACTIP FLEXIVA PULS ID 200XHI (Laser) IMPLANT
TRACTIP FLEXIVA PULSE ID 200 (Laser)
TUBE FEEDING 8FR 16IN STR KANG (MISCELLANEOUS) ×3 IMPLANT
TUBING CONNECTING 10 (TUBING) ×3 IMPLANT
TUBING UROLOGY SET (TUBING) ×3 IMPLANT

## 2021-02-23 NOTE — Discharge Instructions (Signed)
1 - You may have urinary urgency (bladder spasms) and bloody urine on / off with stent in place. This is normal.  2 - Remove tethered stents on Tuesday morning at home by pulling on strings, then blue-white plastic tubing, and discarding. Office is open Tuesday if any problems arise.   3- Call MD or go to ER for fever >102, severe pain / nausea / vomiting not relieved by medications, or acute change in medical status

## 2021-02-23 NOTE — H&P (Signed)
Adrian Lewis is an 75 y.o. male.    Chief Complaint: Pre-OP BILATERAL Ureteroscopic Stone Manipulation  HPI:   1 - Recurrent Surgical Nephrolithiasis-   Recent Stone History:  Pre 2013 - 3 prior stone events, SWL in [place]. Unknown composition  07/2012 - Lt ureteroscopy for CaOx renal stone / teathered stent   01/2020 - KUB, RUS - stable punctate stones  01/2021 - KUB Rt 92mm UPJ area stone. has several trips planned hti summer.    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 Stopped thiazides due to lithium use / interaction.  Metabolic Eval 2013: BMP,PTH,Urate - normal; Composition - CaOx; 24hr Urine - elevated calcium, very low volume,    PMH sig for HTN, HLD, Anxiety / Depression (on lithium and SNRI), septoplasty for sleep apnea, lumbago (great result with chiropracter). No CV disease. No blood thinners. His PC is Adrian Lewis.   Today "Adrian Lewis" is seen to proceed with BILATERAL ureteroscopy with gaol of stone free for R>L renal stones. No interval fevers. Cr 0.78. Most recent UA without infectious parameters. C19 screen negative.     Past Medical History:  Diagnosis Date  . Anxiety   . Arthritis   . Bipolar 2 disorder (HCC)   . Depression   . GERD (gastroesophageal reflux disease)   . History of kidney stones   . Hypertension   . Hypothyroidism   . Insomnia   . Macular degeneration disease   . Sleep apnea    wears cpap    Past Surgical History:  Procedure Laterality Date  . CATARACT EXTRACTION W/ INTRAOCULAR LENS IMPLANT  2011 (APPROX)   RIGHT EYE AND REPAIR DETACHED RETINA  . EYE SURGERY    . HAMMER TOE SURGERY  2012  &  2010  (APPROX)   ONE TOE , EACH FOOT  . HERNIA REPAIR Bilateral    Inguinal Hernia Repair  . JOINT REPLACEMENT Left 2014   Partial Knee Replacement  . KNEE SURGERY Left    x 2  partial and reconstructive  . NASAL TURBINATE REDUCTION Bilateral 09/27/2015   Procedure:  TURBINATE REDUCTION/SUBMUCOSAL RESECTION;  Surgeon: Geanie Logan, Lewis;  Location: [facility];  Service: ENT;  Laterality: Bilateral;  . REPAIR RIGHT INGUINAL HERNIA W/ MESH  02-14-2006  . SEPTOPLASTY N/A 09/27/2015   Procedure: SEPTOPLASTY;  Surgeon: Geanie Logan, Lewis;  Location: [facility];  Service: ENT;  Laterality: N/A;  . URETEROSCOPY  08/12/2012   Procedure: URETEROSCOPY;  Surgeon: Sebastian Ache, Lewis;  Location: [facility];  Service: Urology;  Laterality: Left;  1 HR     Family History  Problem Relation Age of Onset  . Heart attack Father    Social History:  reports that he has never smoked. He has never used smokeless tobacco. He reports current alcohol use of about 14.0 standard drinks of alcohol per week. He reports that he does not use drugs.  Allergies: No Known Allergies  No medications prior to admission.    No results found for this or any previous visit (from the past 48 hour(s)). No results found.  Review of Systems  Constitutional: Negative for chills and fever.  All other systems reviewed and are negative.   There were no vitals taken for this visit. Physical Exam Vitals reviewed.  HENT:     Head: Normocephalic.     Nose: Nose normal.  Eyes:     Pupils: Pupils are equal, round, and  reactive to light.  Cardiovascular:     Rate and Rhythm: Normal rate.     Pulses: Normal pulses.  Pulmonary:     Effort: Pulmonary effort is normal.  Abdominal:     General: Abdomen is flat.  Genitourinary:    Comments: NO CVAT at present.  Musculoskeletal:        General: Normal range of motion.     Cervical back: Normal range of motion.  Skin:    General: Skin is warm.  Neurological:     General: No focal deficit present.     Mental Status: He is alert.  Psychiatric:        Mood and Affect: Mood normal.      Assessment/Plan  Proceed as planned with BILATERAL ureteroscopic stone manipulation with goal of stone free prior to extensive travel. Risks,  benefits, alternatives, exepected peri-op course (including need for temporary bilateral stents since bilateral procedure) discussed previously and reiterated today.   Sebastian Ache, Lewis 02/23/2021, 5:18 AM

## 2021-02-23 NOTE — Anesthesia Procedure Notes (Signed)
Procedure Name: LMA Insertion Date/Time: 02/23/2021 7:24 AM Performed by: Orest Dikes, CRNA Pre-anesthesia Checklist: Patient identified, Emergency Drugs available, Suction available and Patient being monitored Patient Re-evaluated:Patient Re-evaluated prior to induction Oxygen Delivery Method: Circle system utilized Preoxygenation: Pre-oxygenation with 100% oxygen Induction Type: IV induction LMA: LMA inserted LMA Size: 4.0 Number of attempts: 1 Placement Confirmation: positive ETCO2 and breath sounds checked- equal and bilateral Tube secured with: Tape Dental Injury: Teeth and Oropharynx as per pre-operative assessment

## 2021-02-23 NOTE — Brief Op Note (Signed)
02/23/2021  8:26 AM  PATIENT:  Osborne Oman Chatham  75 y.o. male  PRE-OPERATIVE DIAGNOSIS:  BILATERAL RENAL STONES  POST-OPERATIVE DIAGNOSIS:  BILATERAL RENAL STONES  PROCEDURE:  Procedure(s) with comments: CYSTOSCOPY WITH RETROGRADE PYELOGRAM, URETEROSCOPY AND STENT PLACEMENT (Bilateral) - 75 MINS HOLMIUM LASER APPLICATION (Bilateral)  SURGEON:  Surgeon(s) and Role:    Sebastian Ache, MD - Primary  PHYSICIAN ASSISTANT:   ASSISTANTS: none   ANESTHESIA:   general  EBL:  5 mL   BLOOD ADMINISTERED:none  DRAINS: none   LOCAL MEDICATIONS USED:  NONE  SPECIMEN:  Source of Specimen:  Rt renal stone fragments  DISPOSITION OF SPECIMEN:  [facility] for compositional analysis  COUNTS:  YES  TOURNIQUET:  * No tourniquets in log *  DICTATION: .Other Dictation: Dictation Number [id number]  PLAN OF CARE: Discharge to home after PACU  PATIENT DISPOSITION:  PACU - hemodynamically stable.   Delay start of Pharmacological VTE agent (>24hrs) due to surgical blood loss or risk of bleeding: yes

## 2021-02-23 NOTE — Anesthesia Preprocedure Evaluation (Signed)
Anesthesia Evaluation  Patient identified by MRN, date of birth, ID band Patient awake    Reviewed: Allergy & Precautions, NPO status , Patient's Chart, lab work & pertinent test results  Airway Mallampati: II  TM Distance: >3 FB Neck ROM: Full    Dental no notable dental hx.    Pulmonary sleep apnea and Continuous Positive Airway Pressure Ventilation ,    Pulmonary exam normal breath sounds clear to auscultation       Cardiovascular hypertension, Normal cardiovascular exam Rhythm:Regular Rate:Normal     Neuro/Psych Bipolar Disorder negative neurological ROS     GI/Hepatic Neg liver ROS, GERD  ,  Endo/Other  Hypothyroidism   Renal/GU negative Renal ROS  negative genitourinary   Musculoskeletal negative musculoskeletal ROS (+)   Abdominal   Peds negative pediatric ROS (+)  Hematology negative hematology ROS (+)   Anesthesia Other Findings   Reproductive/Obstetrics negative OB ROS                             Anesthesia Physical Anesthesia Plan  ASA: III  Anesthesia Plan: General   Post-op Pain Management:    Induction: Intravenous  PONV Risk Score and Plan: 2 and Ondansetron, Dexamethasone and Treatment may vary due to age or medical condition  Airway Management Planned: LMA  Additional Equipment:   Intra-op Plan:   Post-operative Plan: Extubation in OR  Informed Consent: I have reviewed the patients History and Physical, chart, labs and discussed the procedure including the risks, benefits and alternatives for the proposed anesthesia with the patient or authorized representative who has indicated his/her understanding and acceptance.     Dental advisory given  Plan Discussed with: CRNA and Surgeon  Anesthesia Plan Comments:         Anesthesia Quick Evaluation

## 2021-02-23 NOTE — Transfer of Care (Signed)
Immediate Anesthesia Transfer of Care Note  Patient: Adrian Lewis  Procedure(s) Performed: CYSTOSCOPY WITH RETROGRADE PYELOGRAM, URETEROSCOPY AND STENT PLACEMENT (Bilateral Ureter) HOLMIUM LASER APPLICATION (Bilateral )  Patient Location: PACU  Anesthesia Type:General  Level of Consciousness: awake, alert  and oriented  Airway & Oxygen Therapy: Patient Spontanous Breathing and Patient connected to face mask oxygen  Post-op Assessment: Report given to RN and Post -op Vital signs reviewed and stable  Post vital signs: Reviewed and stable  Last Vitals:  Vitals Value Taken Time  BP    Temp    Pulse 66 02/23/21 0837  Resp 17 02/23/21 0837  SpO2 100 % 02/23/21 0837  Vitals shown include unvalidated device data.  Last Pain:  Vitals:   02/23/21 0600  TempSrc: Oral  PainSc: 0-No pain      Patients Stated Pain Goal: 3 (02/23/21 0600)  Complications: No complications documented.

## 2021-02-23 NOTE — Anesthesia Postprocedure Evaluation (Signed)
Anesthesia Post Note  Patient: Adrian Lewis  Procedure(s) Performed: CYSTOSCOPY WITH RETROGRADE PYELOGRAM, URETEROSCOPY AND STENT PLACEMENT (Bilateral Ureter) HOLMIUM LASER APPLICATION (Bilateral )     Patient location during evaluation: PACU Anesthesia Type: General Level of consciousness: awake and alert Pain management: pain level controlled Vital Signs Assessment: post-procedure vital signs reviewed and stable Respiratory status: spontaneous breathing, nonlabored ventilation, respiratory function stable and patient connected to nasal cannula oxygen Cardiovascular status: blood pressure returned to baseline and stable Postop Assessment: no apparent nausea or vomiting Anesthetic complications: no   No complications documented.  Last Vitals:  Vitals:   02/23/21 0907 02/23/21 0926  BP: 134/71 (!) 152/70  Pulse:  (!) 56  Resp:  16  Temp: 36.6 C 36.6 C  SpO2:  98%    Last Pain:  Vitals:   02/23/21 0926  TempSrc:   PainSc: 2                  Rakiyah Esch S

## 2021-02-24 ENCOUNTER — Encounter (HOSPITAL_COMMUNITY): Payer: Self-pay | Admitting: Urology

## 2021-02-24 NOTE — Op Note (Signed)
NAMETIGRAN, HAYNIE MEDICAL RECORD NO: [id number] ACCOUNT NO: [id number] DATE OF BIRTH: Dec 12, 1945 FACILITY: [facility] LOCATION: [facility] PHYSICIAN: Sebastian Ache, MD  Operative Report   PREOPERATIVE DIAGNOSIS:  Right renal stone, enlarging.  PROCEDURE PERFORMED:   1.  Cystoscopy, bilateral pyelograms, interpretation. 2.  Primary left diagnostic ureteroscopy. 3.  Right ureteroscopy with laser lithotripsy. 4.  Insertion of bilateral ureteral stents, 5 x 24 Polaris with tether.  ESTIMATED BLOOD LOSS:  Nil.  COMPLICATIONS:  None.  SPECIMEN:  Right renal stone fragments for composition analysis.  FINDINGS:   1.  No evidence of left-sided urolithiasis whatsoever. 2.  Right lower pole 7 mm stone. 3.  Complete resolution of all accessible stone fragments larger than one-third mm following laser lithotripsy and basket extraction. 4.  Cystoscopy, placement of bilateral ureteral stents, proximal end in the renal pelvis and distal end in the urinary bladder.  INDICATIONS:  The patient is a pleasant 75 year old man with a history of recurrent urolithiasis.  He is on medical therapy for this, which has reduced his recurrence patterns some;  however, he does still form stones occasionally. He has had a known  right lower pole renal stone for some time with slow interval growth.  He has extensive travel plans for the summer and given his known stone that is growing, he understands he wants to avoid any colic while on his travels. Options were discussed  including continued observation versus medical therapy versus shockwave lithotripsy versus ureteroscopy with a goal of stone free. He wished to proceed. Given his history of significant stones, we elected to proceed with bilateral procedure today.   Informed consent was obtained and placed in the medical record.  DESCRIPTION OF PROCEDURE:  The patient was identified being himself, verified  procedure being bilateral ureteroscopic stents placement was  confirmed.  Procedure timeout was performed.  Intravenous access administered.  General LMA anesthesia induced.  The patient was placed into a low lithotomy position, sterile field was created prepped and draped base of the penis, perineum and proximal thighs using iodine.  Cystourethroscopy was performed using a 21-French rigid cystoscope with offset lens.   Inspection of anterior and posterior urethra was revealed only some mild bilobar hypertrophy.  Mild trabeculation.  No papillary lesions or calcifications noted.  The left ureteral orifice was cannulated with a 6-French renal catheter and left retrograde  pyelogram was obtained.  Left retrograde pyelogram demonstrated a single left ureter, single system left kidney.  No filling defects or narrowing noted.  A 0.038 ZIPwire was advanced to lower pole and set aside as a safety wire.  Next, right retrograde pyelogram was obtained.  Right retrograde pyelogram demonstrates a single right ureter with a single system right kidney.  No filling defects or narrowing noted.  A separate ZIPwire was advanced to lower pole and set aside as a safety wire.  An 8-French feeding tube placed in  the urinary bladder for pressure release.  Semirigid ureteroscopy was performed the entire length of the right ureter alongside a separate sensor working wire.  No mucosal abnormalities were found.  Next, semirigid ureteroscopy was performed the entire  length of the left ureter alongside a separate sensor working wire.  No mucosal abnormalities were found.  The semirigid scope was exchanged for a 12/14 medium length ureteral access sheath at the level of proximal ureter.  Using fluoroscopic guidance,  flexible digital ureteroscopy was performed of the proximal left ureter and systematic inspection of the left kidney, including all calices x3.  Fortunately, there was no evidence of urolithiasis whatsoever within the left kidney or ureter.  No papillary  tip calcifications or  otherwise.  This was quite favorable.  The kidney was inspected x3.  After having confirmed left side stone free,  the access sheath was removed under continuous vision and then placed over the right central working wire at the  level of the proximal right ureter.  Using continuous fluoroscopic guidance, flexible digital ureteroscopy was performed of the proximal right ureter  and systematic inspection of the right kidney.  There was a dominant calcification noted in the lower  pole.  This was too large for simple basketing; however, the angulation was quite poor for laser lithotripsy. As such, it was grasped with the escape basket and repositioned into the upper pole calix to allow for less acute angulation and then holmium  laser energy applied using settings of 0.2 joules and 20 Hz and it was fragmented into 3 smaller pieces that were more amenable to simple basketing with the escape basket and these were removed and set aside for composition analysis.  Repeat endoscopic  exam of the right kidney, including all calices x3 revealed complete resolution of all accessible stone fragments larger than one-third mm.  No evidence of any sort of renal perforation.  Hemostasis was excellent.  The access sheath was removed under  continuous vision.  No significant gross findings were found.  Given the bilateral nature of the procedure today, it was felt that brief interval stenting with a tethered stent would be most prudent.  As such, bilateral 5 x 24 stents were placed over the  remaining safety wire using fluoroscopic guidance.  Good proximal and distal planes were noted.  The procedure was terminated after the tether was fashioned to the dorsum of the penis.  The patient tolerated procedure well, no immediate apparent  complications. The patient was taken to postanesthesia care in stable condition with plan for discharge home.   PAA D: 02/23/2021 8:32:40 am T: 02/24/2021 5:15:00 am  JOB: [id number]/ [id number]

## 2021-08-07 DIAGNOSIS — R972 Elevated prostate specific antigen [PSA]: Secondary | ICD-10-CM

## 2021-08-07 DIAGNOSIS — R7309 Other abnormal glucose: Secondary | ICD-10-CM | POA: Insufficient documentation

## 2021-08-07 HISTORY — DX: Elevated prostate specific antigen (PSA): R97.20

## 2021-08-07 HISTORY — DX: Other abnormal glucose: R73.09

## 2021-10-15 ENCOUNTER — Ambulatory Visit: Payer: Medicare Other | Admitting: Podiatry

## 2022-01-21 ENCOUNTER — Encounter: Payer: Self-pay | Admitting: Psychiatry

## 2022-01-21 ENCOUNTER — Ambulatory Visit (INDEPENDENT_AMBULATORY_CARE_PROVIDER_SITE_OTHER): Payer: Medicare Other | Admitting: Psychiatry

## 2022-01-21 ENCOUNTER — Other Ambulatory Visit: Payer: Self-pay

## 2022-01-21 VITALS — BP 159/90 | HR 88 | Temp 98.8°F | Wt 177.2 lb

## 2022-01-21 DIAGNOSIS — G4721 Circadian rhythm sleep disorder, delayed sleep phase type: Secondary | ICD-10-CM | POA: Insufficient documentation

## 2022-01-21 DIAGNOSIS — F3176 Bipolar disorder, in full remission, most recent episode depressed: Secondary | ICD-10-CM

## 2022-01-21 DIAGNOSIS — Z79899 Other long term (current) drug therapy: Secondary | ICD-10-CM | POA: Diagnosis not present

## 2022-01-21 DIAGNOSIS — G4701 Insomnia due to medical condition: Secondary | ICD-10-CM | POA: Diagnosis not present

## 2022-01-21 HISTORY — DX: Circadian rhythm sleep disorder, delayed sleep phase type: G47.21

## 2022-01-21 HISTORY — DX: Bipolar disorder, in full remission, most recent episode depressed: F31.76

## 2022-01-21 NOTE — Progress Notes (Signed)
Psychiatric Initial Adult Assessment   Patient Identification: Adrian Lewis MRN:  [id number] Date of Evaluation:  01/21/2022 Referral Source: Dr. Martyn Ehrich Chief Complaint:   Chief Complaint  Patient presents with   Establish Care : 76 year old Caucasian male, retired, lives in [place] presented to establish care.   Visit Diagnosis:    ICD-10-CM   1. Bipolar disorder, in full remission, most recent episode depressed ([place])  F31.76 Lithium level    BUN+Creat    TSH    2. Insomnia due to medical condition  G47.01    mood, osa    3. 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  Patient as well as spouse participated in the evaluation today.  Spouse provided collateral information.  Patient reports he was diagnosed with bipolar type II more than 40 years ago.  Patient may have been in his mid 49s when he had his first manic episode.  Patient reports being hyperactive, energetic, always on the go when he has these episodes.  However it did not affect his functioning, so likely he was diagnosed with type II.  Patient reports having depressive episodes, may have had depressive episodes more than manic episodes.  Patient reports being incapacitated by his depression however he could still function at work.  Patient describes this depressive symptoms of sadness, anhedonia, low motivation, low energy, sleep problems and suicidal ideation.  Patient denies any suicide attempts.  Denies any perceptual disturbances.  Patient reports he tried multiple psychotropic medications in the past however lithium worked the best for him.  He continues to be on lithium and takes  around 1125 mg of lithium daily in divided dosage.  Patient is currently also on Lamictal, Wellbutrin he has been on this combination of medications since the past several years.  Patient reports these medications as keeping his depression stable.  The Wellbutrin helps him by providing him energy during the day.  Patient denies any significant anxiety, panic attacks.  Denies any history of trauma.  Denies any suicidality, homicidality or perceptual disturbances.  Patient reports he has been having trouble controlling his blood pressure and his cardiologist and primary care provider recommended he gets established with a psychiatrist to monitor his lithium and also to discuss possible addition of antihypertensive medications.  Patient has already tried and failed trials of medications like amlodipine, metoprolol, clonidine.Patient does report being under the care of psychiatrist in the past , most recently medications were being managed by his primary care provider.  Wife who provided collateral information reported patient is currently doing fairly well with regards to his mood symptoms on the current combination of medications.   Associated Signs/Symptoms: Depression Symptoms:   Currently denies (Hypo) Manic Symptoms:   As noted above Anxiety Symptoms:   Denies Psychotic Symptoms:   Denies PTSD Symptoms: Negative  Past Psychiatric History: Patient was under the care of psychiatrists in the past including Dr. Bridgett Larsson, this may have been several years ago.  Patient also reports being under the care of Dr. Lacie Scotts in the past.  Patient denies inpatient mental health admissions.  Past trials of multiple psychotropic medications.  Past diagnosis of bipolar type II.  Previous Psychotropic Medications:  Yes lithium, Lamictal, bupropion-multiple psychotropics in the past.  Substance Abuse History in the last 12 months:  No.  Consequences of Substance Abuse: Negative  Past Medical History:  Past  Medical History:  Diagnosis Date   Anxiety    Arthritis    Bipolar 2 disorder ([place])    Depression    GERD (gastroesophageal reflux disease)    History of kidney stones    Hypertension    Hypothyroidism    Insomnia    Macular degeneration disease    Sleep apnea    wears cpap    Past Surgical History:  Procedure Laterality Date   CATARACT EXTRACTION W/ INTRAOCULAR LENS IMPLANT  2011 (APPROX)   RIGHT EYE AND REPAIR DETACHED RETINA   CYSTOSCOPY WITH RETROGRADE PYELOGRAM, URETEROSCOPY AND STENT PLACEMENT Bilateral 02/23/2021   Procedure: CYSTOSCOPY WITH RETROGRADE PYELOGRAM, URETEROSCOPY AND STENT PLACEMENT;  Surgeon: Alexis Frock, MD;  Location: [facility];  Service: Urology;  Laterality: Bilateral;  [address] [place] TOE SURGERY  2012  &  2010  (APPROX)   ONE TOE , EACH FOOT   HERNIA REPAIR Bilateral    Inguinal Hernia Repair   HOLMIUM LASER APPLICATION Bilateral [id number]   Procedure: HOLMIUM LASER APPLICATION;  Surgeon: Alexis Frock, MD;  Location: [facility];  Service: Urology;  Laterality: Bilateral;   JOINT REPLACEMENT Left 2014   Partial Knee Replacement   KNEE SURGERY Left    x 2  partial and reconstructive   NASAL TURBINATE REDUCTION Bilateral 09/27/2015   Procedure: TURBINATE REDUCTION/SUBMUCOSAL RESECTION;  Surgeon: Clyde Canterbury, MD;  Location: [facility];  Service: ENT;  Laterality: Bilateral;   REPAIR RIGHT INGUINAL HERNIA W/ MESH  02-14-2006   SEPTOPLASTY N/A 09/27/2015   Procedure: SEPTOPLASTY;  Surgeon: Clyde Canterbury, MD;  Location: [facility];  Service: ENT;  Laterality: N/A;   URETEROSCOPY  08/12/2012   Procedure: URETEROSCOPY;  Surgeon: Alexis Frock, MD;  Location: [facility];  Service: Urology;  Laterality: Left;  1 HR     Family Psychiatric History: As noted below.  His brother and sister-? accidental overdose.  Family History:  Family History  Problem Relation Age of Onset   Heart attack Father    Suicidality Sister    Suicidality  Brother     Social History:   Social History   Socioeconomic History   Marital status: Married    Spouse name: Not on file   Number of children: Not on file   Years of education: Not on file   Highest education level: Not on file  Occupational History   Not on file  Tobacco Use   Smoking status: Never   Smokeless tobacco: Never  Vaping Use   Vaping Use: Never used  Substance and Sexual Activity   Alcohol use: Yes    Alcohol/week: 14.0 standard drinks    Types: 14 Cans of beer per week    Comment: everyday, scotch on occasion   Drug use: No   Sexual activity: Not Currently  Other Topics Concern   Not on file  Social History Narrative   Not on file   Social Determinants of Health   Financial Resource Strain: Not on file  Food Insecurity: Not on file  Transportation Needs: Not on file  Physical Activity: Not on file  Stress: Not on file  Social Connections: Not on file    Additional Social History: Patient was raised in [place].  Patient was raised by  both parents.  He reports he had 4 brothers and 1 sister.  Patient has a PhD in [place].  He is currently retired.  Married since the past 51 years.  Wife is supportive.  Has 2 sons-older son-adopted.  Has a good relationship.  Younger son-has multiple disabilities-currently lives at an alternative family living.  Patient denies any legal problems.  Denies any history of trauma.  Denies being in the [employer].  Currently lives with his wife and [place].  Allergies:   Allergies  Allergen Reactions   Bee Venom Anaphylaxis    Has EPI pen for this   Cat Hair Extract Other (See Comments)    Per pt his nose runs/congestion.    Clonidine Other (See Comments)    fatigue    Metabolic Disorder Labs: No results found for: HGBA1C, MPG No results found for: PROLACTIN No results found for: CHOL, TRIG, HDL, CHOLHDL, VLDL, LDLCALC No results found for: TSH  Therapeutic Level Labs: Lab Results  Component Value  Date   LITHIUM 0.74 06/10/2017   No results found for: CBMZ No results found for: VALPROATE  Current Medications: Current Outpatient Medications  Medication Sig Dispense Refill   Acetylcarnitine HCl (ACETYL L-CARNITINE PO) Take 1 capsule by mouth daily.     Ascorbic Acid (VITAMIN C) 1000 MG tablet Take 1,000 mg by mouth daily.     b complex vitamins capsule Take 1 capsule by mouth daily.     buPROPion (WELLBUTRIN SR) 200 MG 12 hr tablet Take 200 mg by mouth daily.     Citrus Bioflavonoids (BIOFLAVONOID CITRUS) POWD Take 1 capsule by mouth daily.     Coenzyme Q10 (COQ10 PO) Take 60 mg by mouth daily.     DYMISTA 137-50 MCG/ACT SUSP Place 1 spray into the nose daily as needed (allergies).     ergocalciferol (VITAMIN D2) 1.25 MG (50000 UT) capsule Take 50,000 Units by mouth 2 (two) times a week.     eszopiclone (LUNESTA) 2 MG TABS tablet Take 2 mg by mouth at bedtime.     Grape Seed 50 MG CAPS Take 50 mg by mouth daily.     ketorolac (TORADOL) 10 MG tablet Take 1 tablet (10 mg total) by mouth every 8 (eight) hours as needed for moderate pain. Or stent discomfort post-operatively 20 tablet 0   lamoTRIgine (LAMICTAL) 100 MG tablet Take 100 mg by mouth daily.      levothyroxine (SYNTHROID, LEVOTHROID) 25 MCG tablet Take 25 mcg by mouth Daily.      lithium carbonate (ESKALITH) 450 MG CR tablet Take 1,125 mg by mouth daily. Takes 1.5 tablet daily AM and 1 tablet daily at lunch     Misc Natural Products (PROSTATE HEALTH PO) Take 1 capsule by mouth daily.     Multiple Vitamins-Minerals (OCUVITE PRESERVISION PO) Take 1 tablet by mouth daily.     Omega-3 Fatty Acids (FISH OIL) 1000 MG CAPS Take 1,000 mg by mouth daily.     OVER THE COUNTER MEDICATION Take 1 capsule by mouth daily. Live It 2     OVER THE COUNTER MEDICATION Take 1 capsule by mouth daily. Adrenogen     potassium citrate (UROCIT-K) 10 MEQ (1080 MG) SR tablet Take 10 mEq by mouth 2 (two) times daily with a meal.     Probiotic Product  (PROBIOTIC DAILY PO) Take 1 capsule by mouth daily.     Red Yeast Rice 600 MG CAPS Take 600 mg by mouth daily.     RESVERATROL PO  Take 500 mg by mouth daily.     S-Adenosylmethionine (SAME) 400 MG TABS Take 400 mg by mouth daily.     senna-docusate (SENOKOT-S) 8.6-50 MG tablet Take 1 tablet by mouth 2 (two) times daily. While taking strongest pain meds to prevent constipation. 10 tablet 0   TURMERIC CURCUMIN PO Take 300 mg by mouth daily.     Methylsulfonylmethane (MSM) 500 MG CAPS Take 500 mg by mouth daily. (Patient not taking: Reported on 01/21/2022)     No current facility-administered medications for this visit.    Musculoskeletal: Strength & Muscle Tone: within normal limits Gait & Station: normal Patient leans: Front  Psychiatric Specialty Exam: Review of Systems  Psychiatric/Behavioral:  Positive for sleep disturbance.   All other systems reviewed and are negative.  Blood pressure (!) 159/90, pulse 88, temperature 98.8 F (37.1 C), temperature source Temporal, weight 177 lb 3.2 oz (80.4 kg).Body mass index is 27.34 kg/m.  General Appearance: Casual  Eye Contact:  Fair  Speech:  Clear and Coherent  Volume:  Normal  Mood:  Euthymic  Affect:  Congruent  Thought Process:  Goal Directed and Descriptions of Associations: Intact  Orientation:  Full (Time, Place, and Person)  Thought Content:  Logical  Suicidal Thoughts:  No  Homicidal Thoughts:  No  Memory:  Immediate;   Fair Recent;   Fair Remote;   Fair  Judgement:  Fair  Insight:  Fair  Psychomotor Activity:  Normal  Concentration:  Concentration: Fair and Attention Span: Fair  Recall:  [employer] of Knowledge:Fair  Language: Fair  Akathisia:  No  Handed:  Right  AIMS (if indicated):  not done  Assets:  Communication Skills Desire for Improvement Housing Intimacy Social Support  ADL's:  Intact  Cognition: WNL  Sleep:   restless   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  Patient currently stable on the current combination of medication including lithium, will benefit from following medication management.  Plan as noted below. The patient demonstrates the following risk factors for suicide: Chronic risk factors for suicide include: psychiatric disorder of bipolar disorder and completed suicide in a family member. Acute risk factors for suicide include: N/A. Protective factors for this patient include: positive social support, positive therapeutic relationship, coping skills, hope for the future, and life satisfaction. Considering these factors, the overall suicide risk at this point appears to be low. Patient is appropriate for outpatient follow up.   Plan Bipolar disorder type II in remission Continue lithium as prescribed-currently on 1125 mg p.o. daily Continue Lamictal 100 mg p.o. daily Wellbutrin SR 200 mg p.o. daily in the morning  Insomnia-likely secondary to medical condition including obstructive sleep apnea-unstable Continue sleep hygiene techniques. Per review of notes per cardiologist-dated 12/27/2021-new device ordered.  Continue CPAP for OSA. Continue Lunesta 2 mg p.o. nightly.  High risk medication use-reviewed lithium level-most recent 07/31/2021-0.8-therapeutic We will order lithium level to monitor since lithium could get toxic in the system, TSH as well as BUN/creatinine.  Patient provided lab slip.  I have  reviewed notes per cardiologist-dated 12/27/2021-Dr. Kowalski-patient likely will  need ACE inhibitor dose or diuretics in the future if blood pressure is uncontrolled-will need monitoring of lithium.'  Discussed with patient the risk of adding medications like ACE inhibitors, diuretics while on lithium.  Discussed drug to drug interaction.  Discussed he may likely need lowering of his lithium dosage if he needs addition of an antihypertensive medication as discussed above.  His lithium level also needs to be closely monitored.  Also discussed the effect of Wellbutrin on his blood pressure, could consider reducing the dosage of Wellbutrin in future sessions. We will consider getting an EKG as needed in the future.  Patient is currently under the care of cardiology.  We will coordinate care.  Collateral information was obtained from wife-Jennifer as noted above.  Follow-up in clinic in 2 months or sooner in person.   Collaboration of Care: Primary Care Provider AEB continue follow up with primary care provider for management of his high blood pressure.  Patient/Guardian was advised Release of Information must be obtained prior to any record release in order to collaborate their care with an outside provider. Patient/Guardian was advised if they have not already done so to contact the registration department to sign all necessary forms in order for [place] to release information regarding their care.   Consent: Patient/Guardian gives verbal consent for treatment and assignment of benefits for services provided during this visit. Patient/Guardian expressed understanding and agreed to proceed.   This note was generated in part or whole with voice recognition software. Voice recognition is usually quite accurate but there are transcription errors that can and very often do occur. I apologize for any typographical errors that were not detected and corrected.     Ursula Alert, MD 2/28/20238:52 AM

## 2022-01-22 ENCOUNTER — Other Ambulatory Visit: Payer: Self-pay | Admitting: Psychiatry

## 2022-01-22 ENCOUNTER — Encounter: Payer: Self-pay | Admitting: Psychiatry

## 2022-01-23 LAB — BUN+CREAT
BUN/Creatinine Ratio: 16 (ref 10–24)
BUN: 16 mg/dL (ref 8–27)
Creatinine, Ser: 0.99 mg/dL (ref 0.76–1.27)
eGFR: 79 mL/min/{1.73_m2} (ref >59)

## 2022-01-23 LAB — LITHIUM LEVEL: Lithium Lvl: 0.6 mmol/L (ref 0.5–1.2)

## 2022-01-23 LAB — TSH: TSH: 0.996 u[IU]/mL (ref 0.450–4.500)

## 2022-01-25 ENCOUNTER — Telehealth: Payer: Self-pay | Admitting: Psychiatry

## 2022-01-25 NOTE — Telephone Encounter (Signed)
Attempted to contact patient, ? ?Discussed with wife-Jennifer that labs are within normal limits. ?

## 2022-03-05 ENCOUNTER — Encounter: Payer: Self-pay | Admitting: Psychiatry

## 2022-03-05 LAB — BUN+CREAT
BUN/Creatinine Ratio: 17 (ref 10–24)
BUN: 14 mg/dL (ref 8–27)
Creatinine, Ser: 0.83 mg/dL (ref 0.76–1.27)
eGFR: 91 mL/min/{1.73_m2} (ref >59)

## 2022-03-05 LAB — TSH: TSH: 1.03 u[IU]/mL (ref 0.450–4.500)

## 2022-03-19 ENCOUNTER — Telehealth (INDEPENDENT_AMBULATORY_CARE_PROVIDER_SITE_OTHER): Payer: Medicare Other | Admitting: Psychiatry

## 2022-03-19 ENCOUNTER — Encounter: Payer: Self-pay | Admitting: Psychiatry

## 2022-03-19 DIAGNOSIS — R2689 Other abnormalities of gait and mobility: Secondary | ICD-10-CM

## 2022-03-19 DIAGNOSIS — F3176 Bipolar disorder, in full remission, most recent episode depressed: Secondary | ICD-10-CM | POA: Diagnosis not present

## 2022-03-19 DIAGNOSIS — G3184 Mild cognitive impairment, so stated: Secondary | ICD-10-CM

## 2022-03-19 DIAGNOSIS — R251 Tremor, unspecified: Secondary | ICD-10-CM

## 2022-03-19 DIAGNOSIS — G4701 Insomnia due to medical condition: Secondary | ICD-10-CM | POA: Diagnosis not present

## 2022-03-19 DIAGNOSIS — R413 Other amnesia: Secondary | ICD-10-CM

## 2022-03-19 HISTORY — DX: Tremor, unspecified: R25.1

## 2022-03-19 HISTORY — DX: Mild cognitive impairment of uncertain or unknown etiology: G31.84

## 2022-03-19 MED ORDER — BELSOMRA 5 MG PO TABS: 5.0000 mg | ORAL_TABLET | Freq: Every day | ORAL | 0 refills | Status: DC

## 2022-03-19 NOTE — Progress Notes (Signed)
Virtual Visit via Video Note ? ?I connected with Adrian Lewis on 03/19/22 at  1:30 PM EDT by a video enabled telemedicine application and verified that I am speaking with the correct person using two identifiers. ? ?Location ?Provider Location : [facility] ?Patient Location : Home ? ?Participants: Patient , Wife, Provider ? ?  ?I discussed the limitations of evaluation and management by telemedicine and the availability of in person appointments. The patient expressed understanding and agreed to proceed ?  ?I discussed the assessment and treatment plan with the patient. The patient was provided an opportunity to ask questions and all were answered. The patient agreed with the plan and demonstrated an understanding of the instructions. ?  ?The patient was advised to call back or seek an in-person evaluation if the symptoms worsen or if the condition fails to improve as anticipated. ? ? ?[facility] MD OP Progress Note ? ?03/19/2022 2:20 PM ?ELAND FOLKERTS  ?MRN:  [id number] ? ?Chief Complaint:  ?Chief Complaint  ?Patient presents with  ? Follow-up: 76 year old Caucasian male, retired, with history of bipolar disorder, sleep problems presented for medication management.  ? ?HPI: Adrian Lewis is a 76 year old Caucasian male, currently retired, married, lives in [place], has a history of bipolar disorder likely type II, hypothyroidism, obstructive sleep apnea on CPAP, hypertension, primary osteoarthritis was evaluated by telemedicine today. ? ?Patient today reports overall mood symptoms are stable.  He tends to stay active.  Patient reports he spends his time gardening as well as giving talks.  He also spends time with his wife.  His wife has been supportive. ? ?Patient is currently compliant on his medications including lithium. ? ?Patient does report sleep problems.  He reports the CPAP mask makes his mouth dry and that affects his sleep.  He did have that discussion with his sleep provider.  He is not interested in the  alternative surgical option.  Patient reports he currently uses a spoonful of olive oil which he rinses in his mouth prior to using the mask.  That seems to help to some extent.  He currently takes Lunesta 2 mg.  Interested in a change of sleep medication.  Discussed Belsomra. ? ?Patient denies any suicidality, homicidality or perceptual disturbances. ? ?Patient does report he has been having some mild memory problems as well as imbalance with his gait as well as mild tremors, ongoing since the past several months.  Agreeable for neurological consultation.  Will make the referral. ? ?Collateral information obtained from spouse who also agrees with patient. ? ?Denies any other concerns today. ? ?Visit Diagnosis:  ?  ICD-10-CM   ?1. Bipolar disorder, in full remission, most recent episode depressed ([place])  F31.76   ?  ?2. Insomnia due to medical condition  G47.01 Suvorexant (BELSOMRA) 5 MG TABS  ? OSA, dry mouth  ?  ?3. Memory problem  R41.3 Ambulatory referral to Neurology  ?  ?4. Abnormality of gait due to impairment of balance  R26.89 Ambulatory referral to Neurology  ?  ? ? ?Past Psychiatric History: Reviewed past psychiatric history from progress note on 01/21/2022.  Past trials of medications like Lunesta. ? ?Past Medical History:  ?Past Medical History:  ?Diagnosis Date  ? Anxiety   ? Arthritis   ? Bipolar 2 disorder ([place])   ? Depression   ? GERD (gastroesophageal reflux disease)   ? History of kidney stones   ? Hypertension   ? Hypothyroidism   ? Insomnia   ? Macular degeneration disease   ?  Sleep apnea   ? wears cpap  ?  ?Past Surgical History:  ?Procedure Laterality Date  ? CATARACT EXTRACTION W/ INTRAOCULAR LENS IMPLANT  2011 (APPROX)  ? RIGHT EYE AND REPAIR DETACHED RETINA  ? CYSTOSCOPY WITH RETROGRADE PYELOGRAM, URETEROSCOPY AND STENT PLACEMENT Bilateral 02/23/2021  ? Procedure: CYSTOSCOPY WITH RETROGRADE PYELOGRAM, URETEROSCOPY AND STENT PLACEMENT;  Surgeon: Alexis Frock, MD;  Location: [facility];  Service:  Urology;  Laterality: Bilateral;  75 MINS  ? EYE SURGERY    ? [place] TOE SURGERY  2012  &  2010  (APPROX)  ? ONE TOE , EACH FOOT  ? HERNIA REPAIR Bilateral   ? Inguinal Hernia Repair  ? HOLMIUM LASER APPLICATION Bilateral [id number]  ? Procedure: HOLMIUM LASER APPLICATION;  Surgeon: Alexis Frock, MD;  Location: [facility];  Service: Urology;  Laterality: Bilateral;  ? JOINT REPLACEMENT Left 2014  ? Partial Knee Replacement  ? KNEE SURGERY Left   ? x 2  partial and reconstructive  ? NASAL TURBINATE REDUCTION Bilateral 09/27/2015  ? Procedure: TURBINATE REDUCTION/SUBMUCOSAL RESECTION;  Surgeon: Clyde Canterbury, MD;  Location: [facility];  Service: ENT;  Laterality: Bilateral;  ? REPAIR RIGHT INGUINAL HERNIA W/ MESH  02-14-2006  ? SEPTOPLASTY N/A 09/27/2015  ? Procedure: SEPTOPLASTY;  Surgeon: Clyde Canterbury, MD;  Location: [facility];  Service: ENT;  Laterality: N/A;  ? URETEROSCOPY  08/12/2012  ? Procedure: URETEROSCOPY;  Surgeon: Alexis Frock, MD;  Location: [facility];  Service: Urology;  Laterality: Left;  1 HR ?  ? ? ?Family Psychiatric History: Reviewed family psychiatric history from progress note on 01/21/2022. ? ?Family History:  ?Family History  ?Problem Relation Age of Onset  ? Heart attack Father   ? Suicidality Sister   ? Suicidality Brother   ? ? ?Social History: Reviewed social history from progress note on 01/21/2022. ?Social History  ? ?Socioeconomic History  ? Marital status: Married  ?  Spouse name: Not on file  ? Number of children: Not on file  ? Years of education: Not on file  ? Highest education level: Not on file  ?Occupational History  ? Not on file  ?Tobacco Use  ? Smoking status: Never  ? Smokeless tobacco: Never  ?Vaping Use  ? Vaping Use: Never used  ?Substance and Sexual Activity  ? Alcohol use: Yes  ?  Alcohol/week: 14.0 standard drinks  ?  Types: 14 Cans of beer per week  ?  Comment: everyday, scotch on occasion  ? Drug use: No  ? Sexual activity: Not Currently  ?Other Topics Concern   ? Not on file  ?Social History Narrative  ? Not on file  ? ?Social Determinants of Health  ? ?Financial Resource Strain: Not on file  ?Food Insecurity: Not on file  ?Transportation Needs: Not on file  ?Physical Activity: Not on file  ?Stress: Not on file  ?Social Connections: Not on file  ? ? ?Allergies:  ?Allergies  ?Allergen Reactions  ? Bee Venom Anaphylaxis  ?  Has EPI pen for this  ? Cat Hair Extract Other (See Comments)  ?  Per pt his nose runs/congestion.   ? Clonidine Other (See Comments)  ?  fatigue ?fatigue ?fatigue  ? ? ?Metabolic Disorder Labs: ?No results found for: HGBA1C, MPG ?No results found for: PROLACTIN ?No results found for: CHOL, TRIG, HDL, CHOLHDL, VLDL, LDLCALC ?Lab Results  ?Component Value Date  ? TSH 1.030 03/04/2022  ? TSH 0.996 01/22/2022  ? ? ?Therapeutic Level Labs: ?Lab Results  ?  Component Value Date  ? LITHIUM 0.6 01/22/2022  ? LITHIUM 0.74 06/10/2017  ? ?No results found for: VALPROATE ?No components found for:  CBMZ ? ?Current Medications: ?Current Outpatient Medications  ?Medication Sig Dispense Refill  ? lamoTRIgine (LAMICTAL) 100 MG tablet Take 1 tablet by mouth daily.    ? potassium citrate (UROCIT-K) 10 MEQ (1080 MG) SR tablet 1 tablet with meals    ? sodium fluoride (PREVIDENT) 1.1 % GEL dental gel Place onto teeth.    ? Suvorexant (BELSOMRA) 5 MG TABS Take 5 mg by mouth at bedtime. 30 tablet 0  ? Acetylcarnitine HCl (ACETYL L-CARNITINE PO) Take 1 capsule by mouth daily.    ? Ascorbic Acid (VITAMIN C) 1000 MG tablet Take 1,000 mg by mouth daily.    ? b complex vitamins capsule Take 1 capsule by mouth daily.    ? buPROPion (WELLBUTRIN SR) 200 MG 12 hr tablet Take 200 mg by mouth daily.    ? Citrus Bioflavonoids (BIOFLAVONOID CITRUS) POWD Take 1 capsule by mouth daily.    ? Coenzyme Q10 (COQ10 PO) Take 60 mg by mouth daily.    ? DYMISTA 137-50 MCG/ACT SUSP Place 1 spray into the nose daily as needed (allergies).    ? ergocalciferol (VITAMIN D2) 1.25 MG (50000 UT) capsule Take  50,000 Units by mouth 2 (two) times a week.    ? Grape Seed 50 MG CAPS Take 50 mg by mouth daily.    ? ketorolac (TORADOL) 10 MG tablet Take 1 tablet (10 mg total) by mouth every 8 (eight) hours as neede

## 2022-03-21 ENCOUNTER — Encounter: Payer: Self-pay | Admitting: Physician Assistant

## 2022-03-27 ENCOUNTER — Encounter: Payer: Self-pay | Admitting: Physician Assistant

## 2022-03-27 ENCOUNTER — Ambulatory Visit (INDEPENDENT_AMBULATORY_CARE_PROVIDER_SITE_OTHER): Payer: Medicare Other | Admitting: Physician Assistant

## 2022-03-27 VITALS — BP 158/87 | HR 87 | Ht 67.0 in | Wt 178.0 lb

## 2022-03-27 DIAGNOSIS — R413 Other amnesia: Secondary | ICD-10-CM | POA: Diagnosis not present

## 2022-03-27 DIAGNOSIS — G3184 Mild cognitive impairment, so stated: Secondary | ICD-10-CM | POA: Diagnosis not present

## 2022-03-27 NOTE — Progress Notes (Addendum)
? ? ?Assessment/Plan:  ? ?Adrian Lewis is a very pleasant 76 y.o. 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 MoCA today is 25/30 delayed recall 3/5, memory 3/5.  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 ? ? ? Recommendations:  ? ?MIld Cognitive Impairment ? ?MRI brain with/without contrast to assess for underlying structural abnormality and assess vascular load  ?Neurocognitive testing to further evaluate cognitive concerns and determine other underlying cause of memory changes, including potential contribution from sleep, anxiety, or depression  ?Discussed the importance of regular daily schedule with inclusion of crossword puzzles to maintain brain function.  ?Continue to monitor mood with PCP.  ?Stay active at least 30 minutes at least 3 times a week.  ?Naps should be scheduled and should be no longer than 60 minutes and should not occur after 2 PM.  ?Control cardiovascular risk factors  ?Mediterranean diet is recommended  ?Folllow up in 3 months ? ? ?Subjective:  ? ? ?The patient is seen in neurologic consultation at the request of Jomarie Longs, MD for the evaluation of memory.  The patient is accompanied by his wife who supplements the history. ?This is a 76 y.o. year old RH  male who has had memory issues for about   ? ? ?How long did patient have memory difficulties?  He reports that he noticed some changes about 10 years ago, and this changes are slightly worse now.  "They do not trouble me, I do notice that I have more difficulty with the computer, as well as learning new names ".  He is still able to enjoy reading books, playing violin and watching documentaries about [place] culture.[name] ?Patient lives with: Spouse who noticed changes as  well.   ?repeats oneself?  Patient denies, but wife reports that he has been repeating himself for several years now.  ?Disoriented when walking into a room?  Patient denies   ?Leaving objects in unusual places?  Denies.  However he has to retrace himself to find the objects that he has left behind, such as cell phone.   ?Ambulates  with difficulty?  Endorses change in gait, sometimes has to catch himself, ?Recent falls?  Patient denies   ?Any head injuries?  Patient denies   ?History of seizures?   Patient denies   ?Wandering behavior?  Patient denies   ?Patient drives?  He drives very short distances, he has lost interest in driving, as his vision (he has macular degeneration), and reaction time are slower, and that roads seem more dangerous now.   ?Any mood changes such irritability agitation?  He has a history of bipolar disorder and depression, followed by psychiatry.  He takes Ashwaganda, Lithium Lamictal, Wellbutrin and he believes this is well controlled.  In his 80s,, episodes of severe depression. ?Hallucinations?  Patient denies   ?Paranoia?  Patient denies   ?Any sleep changes?  The patient reports vivid dreams, especially in the second part of the night, always in:.  He has never participated, "I am always a bystander of the dreams ".  The patient takes Alfonso Patten to help with it.  He reports that from ages 20 until 76 years old, he was "sleepwalking". ?History of sleep apnea? Endorses.  Uses CPAP however he does not like it because it makes his mouth very dry and this affects  his sleep. ?Any hygiene concerns?  Patient denies   ?Independent of bathing and dressing?  Endorsed  ?Does the patient needs help with medications?  Patient denies   ?Who is in charge of the finances?  Patient and wife do the finances together, denies any missing payments.   ?Any changes in appetite?  Patient denies   ?Patient have trouble swallowing? Patient denies   ?Does the patient cook?  Patient denies   ?Any kitchen accidents  such as leaving the stove on? Patient denies   ?Any headaches?  Patient denies   ?The double vision? Patient denies   ?Any focal numbness or tingling?  Patient denies   ?Chronic back pain Patient denies   ?Unilateral weakness?  Patient denies   ?Any tremors?  Patient reports very mild, on and off tremors they do not interfere with ADLs.  He denies any other parkinsonian signs. ?Any history of anosmia?  Patient denies   ?Any incontinence of urine?  Patient denies   ?Any bowel dysfunction?   Patient denies  ?History of heavy alcohol intake?  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  [place].  He makes about 2 or 3 bottles which he drinks.   ?History of heavy tobacco use?  Patient denies   ?Family history of dementia?  Patient denies there is significant history of depression in mother, and alcohol abuse in father, but no dementia.  Patient is originally from [place], attended [employer] in [place],, then attended graduate studies at [employer], and he had his PhD at [employer].  He is a retired Airline pilotprofessor of English literature, having retired from [employer] 11 years ago. ? ?Past Medical History:  ?Diagnosis Date  ? Anxiety   ? Arthritis   ? Bipolar 2 disorder (HCC)   ? Depression   ? GERD (gastroesophageal reflux disease)   ? History of kidney stones   ? Hypertension   ? Hypothyroidism   ? Insomnia   ? Macular degeneration disease   ? Sleep apnea   ? wears cpap  ?  ? ?Past Surgical History:  ?Procedure Laterality Date  ? CATARACT EXTRACTION W/ INTRAOCULAR LENS IMPLANT  2011 (APPROX)  ? RIGHT EYE AND REPAIR DETACHED RETINA  ? CYSTOSCOPY WITH RETROGRADE PYELOGRAM, URETEROSCOPY AND STENT PLACEMENT Bilateral 02/23/2021  ? Procedure: CYSTOSCOPY WITH RETROGRADE PYELOGRAM, URETEROSCOPY AND STENT PLACEMENT;  Surgeon: Sebastian AcheManny, Theodore, MD;  Location: [facility];  Service: Urology;  Laterality: Bilateral;  75 MINS  ? EYE SURGERY    ? HAMMER TOE SURGERY  2012  &  2010  (APPROX)  ? ONE  TOE , EACH FOOT  ? HERNIA REPAIR Bilateral   ? Inguinal Hernia Repair  ? HOLMIUM LASER APPLICATION Bilateral 02/23/2021  ? Procedure: HOLMIUM LASER APPLICATION;  Surgeon: Sebastian AcheManny, Theodore, MD;  Location: [facility];  Service: Urology;  Laterality: Bilateral;  ? JOINT REPLACEMENT Left 2014  ? Partial Knee Replacement  ? KNEE SURGERY Left   ? x 2  partial and reconstructive  ? NASAL TURBINATE REDUCTION Bilateral 09/27/2015  ? Procedure: TURBINATE REDUCTION/SUBMUCOSAL RESECTION;  Surgeon: Geanie LoganPaul Bennett, MD;  Location: [facility];  Service: ENT;  Laterality: Bilateral;  ? REPAIR RIGHT INGUINAL HERNIA W/ MESH  02-14-2006  ? SEPTOPLASTY N/A 09/27/2015  ? Procedure: SEPTOPLASTY;  Surgeon: Geanie LoganPaul Bennett, MD;  Location: [facility];  Service: ENT;  Laterality: N/A;  ? URETEROSCOPY  08/12/2012  ? Procedure: URETEROSCOPY;  Surgeon: Sebastian Acheheodore Manny, MD;  Location:  [facility];  Service: Urology;  Laterality: Left;  1 HR ?  ?  ? ?Allergies  ?Allergen Reactions  ? Bee Venom Anaphylaxis  ?  Has EPI pen for this  ? Cat Hair Extract Other (See Comments)  ?  Per pt his nose runs/congestion.   ? Clonidine Other (See Comments)  ?  fatigue ?fatigue ?fatigue  ? ? ?Current Outpatient Medications  ?Medication Instructions  ? Acetylcarnitine HCl (ACETYL L-CARNITINE PO) 1 capsule, Daily  ? b complex vitamins capsule 1 capsule, Oral, Daily  ? Belsomra 5 mg, Oral, Daily at bedtime  ? buPROPion (WELLBUTRIN SR) 200 mg, Oral, Daily  ? Citrus Bioflavonoids (BIOFLAVONOID CITRUS) POWD 1 capsule, Oral, Daily  ? Coenzyme Q10 (COQ10 PO) 60 mg, Oral, Daily  ? DYMISTA 137-50 MCG/ACT SUSP 1 spray, Nasal, Daily PRN  ? ergocalciferol (VITAMIN D2) 50,000 Units, Oral, 2 times weekly  ? Fish Oil 1,000 mg, Oral, Daily  ? Grape Seed 50 mg, Oral, Daily  ? ketorolac (TORADOL) 10 mg, Oral, Every 8 hours PRN, Or stent discomfort post-operatively  ? lamoTRIgine (LAMICTAL) 100 MG tablet 1 tablet, Oral, Daily  ? levothyroxine (SYNTHROID) 25 mcg, Daily  ? lithium carbonate  (ESKALITH) 1,125 mg, Oral, Daily, Takes 1.5 tablet daily AM and 1 tablet daily at lunch  ? magnesium oxide (MAG-OX) 400 MG tablet Oral  ? Menaquinone-7 (VITAMIN K2) 100 MCG CAPS 1 capsule, Oral, Daily

## 2022-03-27 NOTE — Patient Instructions (Addendum)
It was a pleasure to see you today at our office.  ? ?Recommendations: ? ?Neurocognitive evaluation at our office ?MRI of the brain, the radiology office will call you to arrange you appointment ?Follow up  in 1 month ? ?RECOMMENDATIONS FOR ALL PATIENTS WITH MEMORY PROBLEMS: ?1. Continue to exercise (Recommend 30 minutes of walking everyday, or 3 hours every week) ?2. Increase social interactions - continue going to [place] and enjoy social gatherings with friends and family ?3. Eat healthy, avoid fried foods and eat more fruits and vegetables ?4. Maintain adequate blood pressure, blood sugar, and blood cholesterol level. Reducing the risk of stroke and cardiovascular disease also helps promoting better memory. ?5. Avoid stressful situations. Live a simple life and avoid aggravations. Organize your time and prepare for the next day in anticipation. ?6. Sleep well, avoid any interruptions of sleep and avoid any distractions in the bedroom that may interfere with adequate sleep quality ?7. Avoid sugar, avoid sweets as there is a strong link between excessive sugar intake, diabetes, and cognitive impairment ?We discussed the Mediterranean diet, which has been shown to help patients reduce the risk of progressive memory disorders and reduces cardiovascular risk. This includes eating fish, eat fruits and green leafy vegetables, nuts like almonds and hazelnuts, walnuts, and also use olive oil. Avoid fast foods and fried foods as much as possible. Avoid sweets and sugar as sugar use has been linked to worsening of memory function. ? ?There is always a concern of gradual progression of memory problems. If this is the case, then we may need to adjust level of care according to patient needs. Support, both to the patient and caregiver, should then be put into place.  ? ? ? ? ?You have been referred for a neuropsychological evaluation (i.e., evaluation of memory and thinking abilities). Please bring someone with you to this  appointment if possible, as it is helpful for the doctor to hear from both you and another adult who knows you well. Please bring eyeglasses and hearing aids if you wear them.  ?  ?The evaluation will take approximately 3 hours and has two parts: ?  ?The first part is a clinical interview with the neuropsychologist (Dr. Melvyn Novas or Dr. Nicole Kindred). During the interview, the neuropsychologist will speak with you and the individual you brought to the appointment.  ?  ?The second part of the evaluation is testing with the doctor's technician Hinton Dyer or Maudie Mercury). During the testing, the technician will ask you to remember different types of material, solve problems, and answer some questionnaires. Your family member will not be present for this portion of the evaluation. ?  ?Please note: We must reserve several hours of the neuropsychologist's time and the psychometrician's time for your evaluation appointment. As such, there is a No-Show fee of $100. If you are unable to attend any of your appointments, please contact our office as soon as possible to reschedule.  ? ? ?FALL PRECAUTIONS: Be cautious when walking. Scan the area for obstacles that may increase the risk of trips and falls. When getting up in the mornings, sit up at the edge of the bed for a few minutes before getting out of bed. Consider elevating the bed at the head end to avoid drop of blood pressure when getting up. Walk always in a well-lit room (use night lights in the walls). Avoid area rugs or power cords from appliances in the middle of the walkways. Use a walker or a cane if necessary and consider physical  therapy for balance exercise. Get your eyesight checked regularly. ? ?FINANCIAL OVERSIGHT: Supervision, especially oversight when making financial decisions or transactions is also recommended. ? ?HOME SAFETY: Consider the safety of the kitchen when operating appliances like stoves, microwave oven, and blender. Consider having supervision and share cooking  responsibilities until no longer able to participate in those. Accidents with firearms and other hazards in the house should be identified and addressed as well. ? ? ?ABILITY TO BE LEFT ALONE: If patient is unable to contact 911 operator, consider using LifeLine, or when the need is there, arrange for someone to stay with patients. Smoking is a fire hazard, consider supervision or cessation. Risk of wandering should be assessed by caregiver and if detected at any point, supervision and safe proof recommendations should be instituted. ? ?MEDICATION SUPERVISION: Inability to self-administer medication needs to be constantly addressed. Implement a mechanism to ensure safe administration of the medications. ? ? ?DRIVING: Regarding driving, in patients with progressive memory problems, driving will be impaired. We advise to have someone else do the driving if trouble finding directions or if minor accidents are reported. Independent driving assessment is available to determine safety of driving. ? ? ?If you are interested in the driving assessment, you can contact the following: ? ?[facility] [employer] [facility] in [place] ? ?[place] [phone] ? ?[facility] [phone] ? ?[facility] [phone] or [phone] ? ? ? ?Mediterranean Diet ?A Mediterranean diet refers to food and lifestyle choices that are based on the traditions of countries located on the [employer]. This way of eating has been shown to help prevent certain conditions and improve outcomes for people who have chronic diseases, like kidney disease and heart disease. ?What are tips for following this plan? ?Lifestyle  ?Cook and eat meals together with your family, when possible. ?Drink enough fluid to keep your urine clear or pale yellow. ?Be physically active every day. This includes: ?Aerobic exercise like running or swimming. ?Leisure activities like gardening, walking, or housework. ?Get 7-8  hours of sleep each night. ?If recommended by your health care provider, drink red wine in moderation. This means 1 glass a day for nonpregnant women and 2 glasses a day for men. A glass of wine equals 5 oz (150 mL). ?Reading food labels  ?Check the serving size of packaged foods. For foods such as rice and pasta, the serving size refers to the amount of cooked product, not dry. ?Check the total fat in packaged foods. Avoid foods that have saturated fat or trans fats. ?Check the ingredients list for added sugars, such as corn syrup. ?Shopping  ?At the grocery store, buy most of your food from the areas near the walls of the store. This includes: ?Fresh fruits and vegetables (produce). ?Grains, beans, nuts, and seeds. Some of these may be available in unpackaged forms or large amounts (in bulk). ?Fresh seafood. ?Poultry and eggs. ?Low-fat dairy products. ?Buy whole ingredients instead of prepackaged foods. ?Buy fresh fruits and vegetables in-season from local farmers markets. ?Buy frozen fruits and vegetables in resealable bags. ?If you do not have access to quality fresh seafood, buy precooked frozen shrimp or canned fish, such as tuna, salmon, or sardines. ?Buy small amounts of raw or cooked vegetables, salads, or olives from the deli or salad bar at your store. ?Stock your pantry so you always have certain foods on hand, such as olive oil, canned tuna, canned tomatoes, rice, pasta, and beans. ?Cooking  ?Cook foods with extra-virgin olive oil  instead of using butter or other vegetable oils. ?Have meat as a side dish, and have vegetables or grains as your main dish. This means having meat in small portions or adding small amounts of meat to foods like pasta or stew. ?Use beans or vegetables instead of meat in common dishes like chili or lasagna. ?Experiment with different cooking methods. Try roasting or broiling vegetables instead of steaming or saut?eing them. ?Add frozen vegetables to soups, stews, pasta, or  rice. ?Add nuts or seeds for added healthy fat at each meal. You can add these to yogurt, salads, or vegetable dishes. ?Marinate fish or vegetables using olive oil, lemon juice, garlic, and fresh herbs. ?Meal p

## 2022-04-08 ENCOUNTER — Ambulatory Visit
Admission: RE | Admit: 2022-04-08 | Discharge: 2022-04-08 | Disposition: A | Discharge status: Home / Self Care | Payer: Medicare Other | Source: Ambulatory Visit | Attending: Physician Assistant | Admitting: Physician Assistant

## 2022-04-16 ENCOUNTER — Encounter: Payer: Self-pay | Admitting: Physician Assistant

## 2022-04-16 ENCOUNTER — Encounter: Payer: Self-pay | Admitting: Psychiatry

## 2022-04-16 ENCOUNTER — Telehealth (INDEPENDENT_AMBULATORY_CARE_PROVIDER_SITE_OTHER): Payer: Medicare Other | Admitting: Psychiatry

## 2022-04-16 DIAGNOSIS — G3184 Mild cognitive impairment, so stated: Secondary | ICD-10-CM

## 2022-04-16 DIAGNOSIS — G4701 Insomnia due to medical condition: Secondary | ICD-10-CM

## 2022-04-16 DIAGNOSIS — F3176 Bipolar disorder, in full remission, most recent episode depressed: Secondary | ICD-10-CM | POA: Diagnosis not present

## 2022-04-16 MED ORDER — LAMOTRIGINE 100 MG PO TABS: 100.0000 mg | ORAL_TABLET | Freq: Every day | ORAL | 1 refills | Status: DC

## 2022-04-16 MED ORDER — ESZOPICLONE 2 MG PO TABS: 2.0000 mg | ORAL_TABLET | Freq: Every evening | ORAL | 0 refills | Status: DC | PRN

## 2022-04-16 MED ORDER — BUPROPION HCL ER (SR) 200 MG PO TB12: 200.0000 mg | ORAL_TABLET | Freq: Every day | ORAL | 1 refills | Status: DC

## 2022-04-16 NOTE — Progress Notes (Signed)
Virtual Visit via Video Note  I connected with Adrian Lewis on 04/16/22 at  1:00 PM EDT by a video enabled telemedicine application and verified that I am speaking with the correct person using two identifiers.  Location Provider Location : [facility] Patient Location : Home  Participants: Patient , Spouse,Provider    I discussed the limitations of evaluation and management by telemedicine and the availability of in person appointments. The patient expressed understanding and agreed to proceed.    I discussed the assessment and treatment plan with the patient. The patient was provided an opportunity to ask questions and all were answered. The patient agreed with the plan and demonstrated an understanding of the instructions.   The patient was advised to call back or seek an in-person evaluation if the symptoms worsen or if the condition fails to improve as anticipated.   BH MD OP Progress Note  04/16/2022 2:31 PM Adrian Lewis  MRN:  [id number]  Chief Complaint:  Chief Complaint  Patient presents with   Follow-up: 76 year old Caucasian male, retired, has a history of bipolar disorder, sleep problems, memory problems, presented for medication management.   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  Patient today reports overall mood symptoms are stable.  Denies any significant anxiety or mood swings.  He has been compliant with his mood stabilizers including lithium, Lamictal.  Denies side effects.  Patient reports sleep problems likely improving.  He reports he was able to fix his problem with his CPAP and that has helped with the sleep.  Patient does take naps, short ones during the day.  He tries not to take too long naps since he is aware that could  affect his sleep at night.  He did not take the Belsomra since it was too expensive for him.  He is currently back on the Lunesta and would like to stay on it.  Does report he has trouble falling asleep however he has been a night owl all his life and his usual fall asleep time is around 12:30 or 1 AM.  Once he is able to fall asleep he is able to stay asleep.  Patient with continued gait imbalance as well as memory problems, short-term memory issues, was referred to neurology, had his initial neurological consultation, had MRI of his brain completed and is currently awaiting follow-up appointment.  He was also referred for neuropsychological testing.  Denies any suicidality, homicidality or perceptual disturbances.  Denies any other concerns today.  As per wife patient is currently compliant on medications and is following up with neurology  Visit Diagnosis:    ICD-10-CM   1. Bipolar disorder, in full remission, most recent episode depressed (HCC)  F31.76 lamoTRIgine (LAMICTAL) 100 MG tablet    buPROPion (WELLBUTRIN SR) 200 MG 12 hr tablet    2. Insomnia due to medical condition  G47.01 eszopiclone (LUNESTA) 2 MG TABS tablet   OSA     3. Mild cognitive impairment  G31.84       Past Psychiatric History: Reviewed past psychiatric history from progress note on 01/21/2022.  Past trials of medications like Lunesta, Belsomra-cost.  Past Medical History:  Past Medical History:  Diagnosis Date   Anxiety    Arthritis    Bipolar 2 disorder (HCC)    Depression    GERD (  gastroesophageal reflux disease)    History of kidney stones    Hypertension    Hypothyroidism    Insomnia    Macular degeneration disease    Sleep apnea    wears cpap    Past Surgical History:  Procedure Laterality Date   CATARACT EXTRACTION W/ INTRAOCULAR LENS IMPLANT  2011 (APPROX)   RIGHT EYE AND REPAIR DETACHED RETINA   CYSTOSCOPY WITH RETROGRADE PYELOGRAM, URETEROSCOPY AND STENT PLACEMENT Bilateral 02/23/2021    Procedure: CYSTOSCOPY WITH RETROGRADE PYELOGRAM, URETEROSCOPY AND STENT PLACEMENT;  Surgeon: Sebastian AcheManny, Theodore, MD;  Location: [facility];  Service: Urology;  Laterality: Bilateral;  75 MINS   EYE SURGERY     HAMMER TOE SURGERY  2012  &  2010  (APPROX)   ONE TOE , EACH FOOT   HERNIA REPAIR Bilateral    Inguinal Hernia Repair   HOLMIUM LASER APPLICATION Bilateral 02/23/2021   Procedure: HOLMIUM LASER APPLICATION;  Surgeon: Sebastian AcheManny, Theodore, MD;  Location: [facility];  Service: Urology;  Laterality: Bilateral;   JOINT REPLACEMENT Left 2014   Partial Knee Replacement   KNEE SURGERY Left    x 2  partial and reconstructive   NASAL TURBINATE REDUCTION Bilateral 09/27/2015   Procedure: TURBINATE REDUCTION/SUBMUCOSAL RESECTION;  Surgeon: Geanie LoganPaul Bennett, MD;  Location: [facility];  Service: ENT;  Laterality: Bilateral;   REPAIR RIGHT INGUINAL HERNIA W/ MESH  02-14-2006   SEPTOPLASTY N/A 09/27/2015   Procedure: SEPTOPLASTY;  Surgeon: Geanie LoganPaul Bennett, MD;  Location: [facility];  Service: ENT;  Laterality: N/A;   URETEROSCOPY  08/12/2012   Procedure: URETEROSCOPY;  Surgeon: Sebastian Acheheodore Manny, MD;  [facility];  Service: Urology;  Laterality: Left;  1 HR     Family Psychiatric History: Reviewed family psychiatric history from progress note on 01/21/2022.  Family History:  Family History  Problem Relation Age of Onset   Heart attack Father    Suicidality Sister    Suicidality Brother     Social History: Reviewed social history from progress note on 01/21/2022. Social History   Socioeconomic History   Marital status: Married    Spouse name: Not on file   Number of children: Not on file   Years of education: 28   Highest education level: Not on file  Occupational History   Not on file  Tobacco Use   Smoking status: Never   Smokeless tobacco: Never  Vaping Use   Vaping Use: Never used  Substance and Sexual Activity   Alcohol use: Yes    Alcohol/week: 14.0 standard drinks    Types: 14  Cans of beer per week    Comment: everyday, scotch on occasion   Drug use: No   Sexual activity: Not Currently  Other Topics Concern   Not on file  Social History Narrative   Right handed   Drinks caffeine   Two story home   Social Determinants of Health   Financial Resource Strain: Not on file  Food Insecurity: Not on file  Transportation Needs: Not on file  Physical Activity: Not on file  Stress: Not on file  Social Connections: Not on file    Allergies:  Allergies  Allergen Reactions   Bee Venom Anaphylaxis    Has EPI pen for this   Cat Hair Extract Other (See Comments)    Per pt his nose runs/congestion.    Clonidine Other (See Comments)    fatigue fatigue fatigue    Metabolic Disorder Labs: No results found for: HGBA1C, MPG No results found  for: PROLACTIN No results found for: CHOL, TRIG, HDL, CHOLHDL, VLDL, LDLCALC Lab Results  Component Value Date   TSH 1.030 03/04/2022   TSH 0.996 01/22/2022    Therapeutic Level Labs: Lab Results  Component Value Date   LITHIUM 0.6 01/22/2022   LITHIUM 0.74 06/10/2017   No results found for: VALPROATE No components found for:  CBMZ  Current Medications: Current Outpatient Medications  Medication Sig Dispense Refill   Ascorbic Acid (VITAMIN C) 1000 MG tablet Take 1,000 mg by mouth daily.     b complex vitamins capsule Take 1 capsule by mouth daily.     Citrus Bioflavonoids (BIOFLAVONOID CITRUS) POWD Take 1 capsule by mouth daily.     Coenzyme Q10 (COQ10 PO) Take 60 mg by mouth daily.     DYMISTA 137-50 MCG/ACT SUSP Place 1 spray into the nose daily as needed (allergies).     ergocalciferol (VITAMIN D2) 1.25 MG (50000 UT) capsule Take 50,000 Units by mouth 2 (two) times a week.     eszopiclone (LUNESTA) 2 MG TABS tablet Take 1 tablet (2 mg total) by mouth at bedtime as needed for sleep. Take immediately before bedtime 30 tablet 0   Grape Seed 50 MG CAPS Take 50 mg by mouth daily.     levothyroxine (SYNTHROID,  LEVOTHROID) 25 MCG tablet Take 25 mcg by mouth Daily.      lithium carbonate (ESKALITH) 450 MG CR tablet Take 1,125 mg by mouth daily. Takes 1.5 tablet daily AM and 1 tablet daily at lunch     magnesium oxide (MAG-OX) 400 MG tablet Take by mouth.     Menaquinone-7 (VITAMIN K2) 100 MCG CAPS Take 1 capsule by mouth daily.     Misc Natural Products (PROSTATE HEALTH PO) Take 1 capsule by mouth daily.     Multiple Vitamins-Minerals (OCUVITE PRESERVISION PO) Take 1 tablet by mouth daily.     Omega-3 Fatty Acids (FISH OIL) 1000 MG CAPS Take 1,000 mg by mouth daily.     OVER THE COUNTER MEDICATION Take 1 capsule by mouth daily. Adrenogen     potassium citrate (UROCIT-K) 10 MEQ (1080 MG) SR tablet 1 tablet with meals     Probiotic Product (PROBIOTIC DAILY PO) Take 1 capsule by mouth daily.     Quercetin 500 MG CAPS Take 1 capsule by mouth daily.     Red Yeast Rice 600 MG CAPS Take 600 mg by mouth daily.     RESVERATROL PO Take 500 mg by mouth daily.     S-Adenosylmethionine (SAME) 400 MG TABS Take 400 mg by mouth daily.     senna-docusate (SENOKOT-S) 8.6-50 MG tablet Take 1 tablet by mouth 2 (two) times daily. While taking strongest pain meds to prevent constipation. 10 tablet 0   Tryptophan 500 MG CAPS Take 1 capsule by mouth at bedtime.     TURMERIC CURCUMIN PO Take 300 mg by mouth daily.     buPROPion (WELLBUTRIN SR) 200 MG 12 hr tablet Take 1 tablet (200 mg total) by mouth daily. 90 tablet 1   lamoTRIgine (LAMICTAL) 100 MG tablet Take 1 tablet (100 mg total) by mouth daily. 90 tablet 1   sodium fluoride (FLUORISHIELD) 1.1 % GEL dental gel Place onto teeth. (Patient not taking: Reported on 03/27/2022)     No current facility-administered medications for this visit.     Musculoskeletal: Strength & Muscle Tone:  UTA Gait & Station:  UTA Patient leans: N/A  Psychiatric Specialty Exam: Review of Systems  Neurological:  Positive for tremors (improved).  Psychiatric/Behavioral:  Positive for sleep  disturbance.   All other systems reviewed and are negative.  There were no vitals taken for this visit.There is no height or weight on file to calculate BMI.  General Appearance: Casual  Eye Contact:  Fair  Speech:  Normal Rate  Volume:  Normal  Mood:  Euthymic  Affect:  Congruent  Thought Process:  Goal Directed and Descriptions of Associations: Intact  Orientation:  Full (Time, Place, and Person)  Thought Content: Logical   Suicidal Thoughts:  No  Homicidal Thoughts:  No  Memory:  Immediate;   Fair Recent;   Fair Remote;   limited , reports short term memory loss  Judgement:  Fair  Insight:  Fair  Psychomotor Activity:  Normal  Concentration:  Concentration: Fair and Attention Span: Fair  Recall:  [employer] of Knowledge: Fair  Language: Fair  Akathisia:  No  Handed:  Right  AIMS (if indicated): not done  Assets:  Communication Skills Desire for Improvement Housing Social Support  ADL's:  Intact  Cognition: Impaired - likely mild  Sleep:   improving   Screenings: Equities trader Office Visit from [facility]/21/2022 in [facility]  PHQ-2 Total Score 0      Flowsheet Row Pre-Admission Testing 59 from 02/15/2021 in [facility]PRE[facility] TEST[facility]  C-SSRS RISK CATEGORY No Risk        Assessment and Plan: Adrian Lewis is a 76 year old Caucasian male who has a history of bipolar disorder, type II, insomnia, cognitive disorder likely mild, presented for medication management.  Patient is currently improving with regards to his sleep, currently under the care of neurology for cognitive impairment.  Discussed plan as noted below.  Plan Bipolar disorder type II in remission Lithium 1125 mg p.o. daily Lamotrigine 100 mg p.o. daily Wellbutrin SR 200 mg p.o. daily in the morning. Lithium level-reviewed and discussed with patient-dated 01/22/2022-0.6-therapeutic BUN/creatinine-within normal limits dated 03/04/2022.   TSH-within normal limits-03/04/2022.  Insomnia-improving Continue CPAP for OSA Currently back on Lunesta 2 mg p.o. nightly Discontinue Belsomra for noncompliance due to cost. Reviewed [place] PMP aware  Mild cognitive impairment-unstable Patient was referred for neurological consultation-reviewed notes per Ms.Marlowe Kays -dated 03/27/2022- [facility] - 25/30.  MRI brain with and without contrast ordered.  Neurocognitive testing.  Mediterranean diet.  Controlled cardiovascular risk factors. Patient has upcoming appointment in June.  Collateral information obtained from wife as noted above.  Follow-up in clinic in 2 to 3 months or sooner if needed.    Collaboration of Care: Collaboration of Care: Other encouraged to follow-up with neurologist.  Patient/Guardian was advised Release of Information must be obtained prior to any record release in order to collaborate their care with an outside provider. Patient/Guardian was advised if they have not already done so to contact the registration department to sign all necessary forms in order for [place] to release information regarding their care.   Consent: Patient/Guardian gives verbal consent for treatment and assignment of benefits for services provided during this visit. Patient/Guardian expressed understanding and agreed to proceed.   This note was generated in part or whole with voice recognition software. Voice recognition is usually quite accurate but there are transcription errors that can and very often do occur. I apologize for any typographical errors that were not detected and corrected.      Jomarie Longs, MD 04/16/2022, 2:31 PM

## 2022-04-26 ENCOUNTER — Other Ambulatory Visit: Payer: Self-pay

## 2022-04-26 DIAGNOSIS — K118 Other diseases of salivary glands: Secondary | ICD-10-CM

## 2022-05-07 ENCOUNTER — Encounter: Payer: Self-pay | Admitting: Physician Assistant

## 2022-05-07 ENCOUNTER — Ambulatory Visit (INDEPENDENT_AMBULATORY_CARE_PROVIDER_SITE_OTHER): Payer: Medicare Other | Admitting: Physician Assistant

## 2022-05-07 VITALS — Resp 20 | Ht 67.0 in | Wt 178.0 lb

## 2022-05-07 DIAGNOSIS — G3184 Mild cognitive impairment, so stated: Secondary | ICD-10-CM | POA: Diagnosis not present

## 2022-05-07 NOTE — Progress Notes (Signed)
Assessment/Plan:   Mild Cognitive Impairment   Adrian Lewis is a very pleasant 76 y.o. year old RH male, retired [place] [employer] [place] with  a history of  bipolar disorder, insomnia, hypertension, hypothyroidism, macular degeneration, OSA on CPAP, osteoarthritis, hyperlipidemia, anxiety, depression seen today for evaluation of memory loss.  He was last seen on 03/27/2022, at which time his MoCA was 25/30.  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  He has been referred to ENT for this.  Patient is not on antidementia medication, he is adamant to not start on any pharmaceutical products, "I do not believe in pharmaceutical companies ".  He is more keen to lean to more natural approach, functional medicine products.  Recommendations:   Neurocognitive testing is scheduled for later this year Follow-up after the neurocognitive testing Strong control of cardiovascular risk factors Continue CPAP Follow-up with the ENT regarding the left parotid gland lesion   Case discussed with Dr. Karel JarvisAquino who agrees with the plan   Subjective:    Adrian Lewis is a very pleasant 76 y.o. RH male  seen today in follow up for memory loss. This patient is accompanied in the office by his wife who supplements the history.  Previous records as well as any outside records available were reviewed prior to todays visit.  Patient was last seen at our office on 03/27/22 at which time his MoCA was 25/30  Patient is currently on    Any changes in memory since last visit?  Patient denies any worsening of his symptoms.  Main difficulty is short-term memory, such as learning new names.  He continues to read books, playing the violin, and watching documentaries about [place] for example. Patient  lives with: Spouse who noticed changes as well.  repeats oneself? Wife reports that he has been doing that for a long time. Disoriented when walking into a room?  Patient denies   Leaving objects in unusual places?  No changes, he still has to retrace himself to find the objects that he has left behind such as a cell phone. Ambulates  with difficulty?   He still has some balance issues, but denies any recent falls.  [facility] [employer] [facility], does gardening. Recent falls?  Patient denies   Any head injuries?  Patient denies   History of seizures?   Patient denies   Wandering behavior?  Patient denies   Patient drives?  He drives very short distances, he is not that interested in driving, especially since his vision is not good due to macular degeneration, and his reaction time is lower, he feels that they roads are more dangerous now. Any mood changes?  The patient has a history of bipolar disorder, well controlled by psychiatry.  He is currently on ashwagandha, lithium, Lamictal and Wellbutrin.  Lithium 1125 mg p.o. daily Any depression?:  He does not feel depressed currently. Hallucinations?  Patient denies   Paranoia?  Patient denies   Patient reports that he sleeps with some vivid dreams, denies REM behavior or sleepwalking.  He has some issues with falling asleep, but then he stays asleep. History of sleep apnea?  Endorsed.  Using his CPAP  Any hygiene concerns?  Patient denies   Independent of bathing and dressing?  Endorsed  Does the patient needs  help with medications?  Patient is in charge of his medications. Who is in charge of the finances?  He and his wife do the finances together.   Any changes in appetite?  Patient denies.  He does admit to not drinking enough water. Patient have trouble swallowing? Patient denies   Does the patient cook?  Patient denies   Any kitchen accidents such as leaving the stove on? Patient denies   Any headaches?  Patient denies   double vision?  He has a history of double vision corrected with glasses, and history of macular degeneration  Any focal numbness or tingling?  Patient denies   Chronic back pain Patient denies   Unilateral weakness?  Patient denies   Any tremors?  Patient denies   Any history of anosmia?  Patient denies   Any incontinence of urine?  Patient denies   Any bowel dysfunction?   Patient denies   MRI brain  5/2023No evidence of acute intracranial abnormality. Minimal chronic small-vessel ischemic changes within the cerebral white matter, stable from the prior brain MRI of 06/11/2017.  Mild generalized cerebral atrophy. 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 Consider ENT consultation.  Labs TSH 03/05/22 nl 1.030   Past Medical History:  Diagnosis Date   Anxiety    Arthritis    Bipolar 2 disorder (HCC)    Depression    GERD (gastroesophageal reflux disease)    History of kidney stones    Hypertension    Hypothyroidism    Insomnia    Macular degeneration disease    Sleep apnea    wears cpap     Past Surgical History:  Procedure Laterality Date   CATARACT EXTRACTION W/ INTRAOCULAR LENS IMPLANT  2011 (APPROX)   RIGHT EYE AND REPAIR DETACHED RETINA   CYSTOSCOPY WITH RETROGRADE PYELOGRAM, URETEROSCOPY AND STENT PLACEMENT Bilateral 02/23/2021   Procedure: CYSTOSCOPY WITH RETROGRADE PYELOGRAM, URETEROSCOPY AND STENT PLACEMENT;  Surgeon: Sebastian Ache, MD;  Location: [facility];  Service: Urology;  Laterality: Bilateral;  75 MINS   EYE SURGERY     HAMMER TOE SURGERY  2012  &  2010  (APPROX)   ONE TOE , EACH FOOT   HERNIA REPAIR Bilateral    Inguinal Hernia Repair   HOLMIUM LASER APPLICATION Bilateral 02/23/2021   Procedure: HOLMIUM LASER APPLICATION;  Surgeon: Sebastian Ache, MD;  Location: [facility];  Service: Urology;  Laterality: Bilateral;   JOINT REPLACEMENT Left 2014   Partial Knee  Replacement   KNEE SURGERY Left    x 2  partial and reconstructive   NASAL TURBINATE REDUCTION Bilateral 09/27/2015   Procedure: TURBINATE REDUCTION/SUBMUCOSAL RESECTION;  Surgeon: Geanie Logan, MD;  Location: [facility];  Service: ENT;  Laterality: Bilateral;   REPAIR RIGHT INGUINAL HERNIA W/ MESH  02-14-2006   SEPTOPLASTY N/A 09/27/2015   Procedure: SEPTOPLASTY;  Surgeon: Geanie Logan, MD;  Location: [facility];  Service: ENT;  Laterality: N/A;   URETEROSCOPY  08/12/2012   Procedure: URETEROSCOPY;  Surgeon: Sebastian Ache, MD;  Location: [facility];  Service: Urology;  Laterality: Left;  1 HR      PREVIOUS MEDICATIONS:   CURRENT MEDICATIONS:  Outpatient Encounter Medications as of 05/07/2022  Medication Sig   Ascorbic Acid (VITAMIN C) 1000 MG tablet Take 1,000 mg by mouth daily.   b complex vitamins capsule Take 1 capsule by mouth daily.   buPROPion (WELLBUTRIN SR) 200 MG 12  hr tablet Take 1 tablet (200 mg total) by mouth daily.   Citrus Bioflavonoids (BIOFLAVONOID CITRUS) POWD Take 1 capsule by mouth daily.   Coenzyme Q10 (COQ10 PO) Take 60 mg by mouth daily.   DYMISTA 137-50 MCG/ACT SUSP Place 1 spray into the nose daily as needed (allergies).   ergocalciferol (VITAMIN D2) 1.25 MG (50000 UT) capsule Take 50,000 Units by mouth 2 (two) times a week.   eszopiclone (LUNESTA) 2 MG TABS tablet Take 1 tablet (2 mg total) by mouth at bedtime as needed for sleep. Take immediately before bedtime   Grape Seed 50 MG CAPS Take 50 mg by mouth daily.   lamoTRIgine (LAMICTAL) 100 MG tablet Take 1 tablet (100 mg total) by mouth daily.   levothyroxine (SYNTHROID, LEVOTHROID) 25 MCG tablet Take 25 mcg by mouth Daily.    lithium carbonate (ESKALITH) 450 MG CR tablet Take 1,125 mg by mouth daily. Takes 1.5 tablet daily AM and 1 tablet daily at lunch   magnesium oxide (MAG-OX) 400 MG tablet Take by mouth.   Menaquinone-7 (VITAMIN K2) 100 MCG CAPS Take 1 capsule by mouth daily.   Misc Natural  Products (PROSTATE HEALTH PO) Take 1 capsule by mouth daily.   Multiple Vitamins-Minerals (OCUVITE PRESERVISION PO) Take 1 tablet by mouth daily.   Omega-3 Fatty Acids (FISH OIL) 1000 MG CAPS Take 1,000 mg by mouth daily.   OVER THE COUNTER MEDICATION Take 1 capsule by mouth daily. Adrenogen   potassium citrate (UROCIT-K) 10 MEQ (1080 MG) SR tablet 1 tablet with meals   Probiotic Product (PROBIOTIC DAILY PO) Take 1 capsule by mouth daily.   Quercetin 500 MG CAPS Take 1 capsule by mouth daily.   Red Yeast Rice 600 MG CAPS Take 600 mg by mouth daily.   RESVERATROL PO Take 500 mg by mouth daily.   S-Adenosylmethionine (SAME) 400 MG TABS Take 400 mg by mouth daily.   senna-docusate (SENOKOT-S) 8.6-50 MG tablet Take 1 tablet by mouth 2 (two) times daily. While taking strongest pain meds to prevent constipation.   sodium fluoride (FLUORISHIELD) 1.1 % GEL dental gel Place onto teeth. (Patient not taking: Reported on 03/27/2022)   Tryptophan 500 MG CAPS Take 1 capsule by mouth at bedtime.   TURMERIC CURCUMIN PO Take 300 mg by mouth daily.   No facility-administered encounter medications on file as of 05/07/2022.     Objective:     PHYSICAL EXAMINATION:    VITALS:   Vitals:   05/07/22 0933  Resp: 20  Weight: 178 lb (80.7 kg)  Height:  (1.702 m)    GEN:  The patient appears stated age and is in NAD. HEENT:  Normocephalic, atraumatic.   Neurological examination:  General: NAD, well-groomed, appears stated age. Orientation: The patient is alert. Oriented to person, place and date Cranial nerves: There is good facial symmetry.The speech is fluent and clear. No aphasia or dysarthria. Fund of knowledge is appropriate. Recent memory impaired and remote memory is normal.  Attention and concentration are normal.  Able to name objects and repeat phrases.  Hearing is intact to conversational tone.    Sensation: Sensation is intact to light touch throughout Motor: Strength is at least  antigravity x4. Tremors: none  DTR's 2/4 in UE/LE      03/27/2022   10:00 AM  Montreal Cognitive Assessment   Visuospatial/ Executive (0/5) 2  Naming (0/3) 3  Attention: Read list of digits (0/2) 2  Attention: Read list of letters (0/1) 1  Attention:  Serial 7 subtraction starting at 100 (0/3) 3  Language: Repeat phrase (0/2) 2  Language : Fluency (0/1) 1  Abstraction (0/2) 2  Delayed Recall (0/5) 3  Orientation (0/6) 6  Total 25  Adjusted Score (based on education) 25        No data to display             Movement examination: Tone: There is normal tone in the UE/LE Abnormal movements:  no tremor.  No myoclonus.  No asterixis.   Coordination:  There is no decremation with RAM's. Normal finger to nose  Gait and Station: The patient has some difficulty arising out of a deep-seated chair without the use of the hands. The patient's stride length is good.  Gait is cautious and narrow.   Thank you for allowing [place] the opportunity to participate in the care of this nice patient. Please do not hesitate to contact us for any questions or concerns.   Total time spent on today's visit was 42 minutes dedicated to this patient today, preparing to see patient, examining the patient, ordering tests and/or medications and counseling the patient, documenting clinical information in the EHR or other health record, independently interpreting results and communicating results to the patient/family, discussing treatment and goals, answering patient's questions and coordinating care.  Cc:  Dorothey Baseman, MD  Marlowe Kays 05/08/2022 7:28 AM

## 2022-05-07 NOTE — Patient Instructions (Signed)
It was a pleasure to see you today at our office.   Recommendations:  Neurocognitive evaluation at our office Follow up after the neurocognitive testing Follow up with ENT  RECOMMENDATIONS FOR ALL PATIENTS WITH MEMORY PROBLEMS: 1. Continue to exercise (Recommend 30 minutes of walking everyday, or 3 hours every week) 2. Increase social interactions - continue going to [place] and enjoy social gatherings with friends and family 3. Eat healthy, avoid fried foods and eat more fruits and vegetables 4. Maintain adequate blood pressure, blood sugar, and blood cholesterol level. Reducing the risk of stroke and cardiovascular disease also helps promoting better memory. 5. Avoid stressful situations. Live a simple life and avoid aggravations. Organize your time and prepare for the next day in anticipation. 6. Sleep well, avoid any interruptions of sleep and avoid any distractions in the bedroom that may interfere with adequate sleep quality 7. Avoid sugar, avoid sweets as there is a strong link between excessive sugar intake, diabetes, and cognitive impairment We discussed the Mediterranean diet, which has been shown to help patients reduce the risk of progressive memory disorders and reduces cardiovascular risk. This includes eating fish, eat fruits and green leafy vegetables, nuts like almonds and hazelnuts, walnuts, and also use olive oil. Avoid fast foods and fried foods as much as possible. Avoid sweets and sugar as sugar use has been linked to worsening of memory function.  There is always a concern of gradual progression of memory problems. If this is the case, then we may need to adjust level of care according to patient needs. Support, both to the patient and caregiver, should then be put into place.      You have been referred for a neuropsychological evaluation (i.e., evaluation of memory and thinking abilities). Please bring someone with you to this appointment if possible, as it is helpful  for the doctor to hear from both you and another adult who knows you well. Please bring eyeglasses and hearing aids if you wear them.    The evaluation will take approximately 3 hours and has two parts:   The first part is a clinical interview with the neuropsychologist (Dr. Milbert Coulter or Dr. Roseanne Reno). During the interview, the neuropsychologist will speak with you and the individual you brought to the appointment.    The second part of the evaluation is testing with the doctor's technician Annabelle Harman or Selena Batten). During the testing, the technician will ask you to remember different types of material, solve problems, and answer some questionnaires. Your family member will not be present for this portion of the evaluation.   Please note: We must reserve several hours of the neuropsychologist's time and the psychometrician's time for your evaluation appointment. As such, there is a No-Show fee of $100. If you are unable to attend any of your appointments, please contact our office as soon as possible to reschedule.    FALL PRECAUTIONS: Be cautious when walking. Scan the area for obstacles that may increase the risk of trips and falls. When getting up in the mornings, sit up at the edge of the bed for a few minutes before getting out of bed. Consider elevating the bed at the head end to avoid drop of blood pressure when getting up. Walk always in a well-lit room (use night lights in the walls). Avoid area rugs or power cords from appliances in the middle of the walkways. Use a walker or a cane if necessary and consider physical therapy for balance exercise. Get your eyesight checked regularly.  FINANCIAL OVERSIGHT: Supervision, especially oversight when making financial decisions or transactions is also recommended.  HOME SAFETY: Consider the safety of the kitchen when operating appliances like stoves, microwave oven, and blender. Consider having supervision and share cooking responsibilities until no longer able to  participate in those. Accidents with firearms and other hazards in the house should be identified and addressed as well.   ABILITY TO BE LEFT ALONE: If patient is unable to contact 911 operator, consider using LifeLine, or when the need is there, arrange for someone to stay with patients. Smoking is a fire hazard, consider supervision or cessation. Risk of wandering should be assessed by caregiver and if detected at any point, supervision and safe proof recommendations should be instituted.  MEDICATION SUPERVISION: Inability to self-administer medication needs to be constantly addressed. Implement a mechanism to ensure safe administration of the medications.   DRIVING: Regarding driving, in patients with progressive memory problems, driving will be impaired. We advise to have someone else do the driving if trouble finding directions or if minor accidents are reported. Independent driving assessment is available to determine safety of driving.   If you are interested in the driving assessment, you can contact the following:  [facility] [employer] [facility] in [place] [phone]  [facility] [phone]  [facility] [phone] or [phone]    Mediterranean Diet A Mediterranean diet refers to food and lifestyle choices that are based on the traditions of countries located on the [employer]. This way of eating has been shown to help prevent certain conditions and improve outcomes for people who have chronic diseases, like kidney disease and heart disease. What are tips for following this plan? Lifestyle  Cook and eat meals together with your family, when possible. Drink enough fluid to keep your urine clear or pale yellow. Be physically active every day. This includes: Aerobic exercise like running or swimming. Leisure activities like gardening, walking, or housework. Get 7-8 hours of sleep each night. If recommended  by your health care provider, drink red wine in moderation. This means 1 glass a day for nonpregnant women and 2 glasses a day for men. A glass of wine equals 5 oz (150 mL). Reading food labels  Check the serving size of packaged foods. For foods such as rice and pasta, the serving size refers to the amount of cooked product, not dry. Check the total fat in packaged foods. Avoid foods that have saturated fat or trans fats. Check the ingredients list for added sugars, such as corn syrup. Shopping  At the grocery store, buy most of your food from the areas near the walls of the store. This includes: Fresh fruits and vegetables (produce). Grains, beans, nuts, and seeds. Some of these may be available in unpackaged forms or large amounts (in bulk). Fresh seafood. Poultry and eggs. Low-fat dairy products. Buy whole ingredients instead of prepackaged foods. Buy fresh fruits and vegetables in-season from local farmers markets. Buy frozen fruits and vegetables in resealable bags. If you do not have access to quality fresh seafood, buy precooked frozen shrimp or canned fish, such as tuna, salmon, or sardines. Buy small amounts of raw or cooked vegetables, salads, or olives from the deli or salad bar at your store. Stock your pantry so you always have certain foods on hand, such as olive oil, canned tuna, canned tomatoes, rice, pasta, and beans. Cooking  Cook foods with extra-virgin olive oil instead of using butter or other vegetable oils. Have meat  as a side dish, and have vegetables or grains as your main dish. This means having meat in small portions or adding small amounts of meat to foods like pasta or stew. Use beans or vegetables instead of meat in common dishes like chili or lasagna. Experiment with different cooking methods. Try roasting or broiling vegetables instead of steaming or sauteing them. Add frozen vegetables to soups, stews, pasta, or rice. Add nuts or seeds for added healthy fat  at each meal. You can add these to yogurt, salads, or vegetable dishes. Marinate fish or vegetables using olive oil, lemon juice, garlic, and fresh herbs. Meal planning  Plan to eat 1 vegetarian meal one day each week. Try to work up to 2 vegetarian meals, if possible. Eat seafood 2 or more times a week. Have healthy snacks readily available, such as: Vegetable sticks with hummus. Greek yogurt. Fruit and nut trail mix. Eat balanced meals throughout the week. This includes: Fruit: 2-3 servings a day Vegetables: 4-5 servings a day Low-fat dairy: 2 servings a day Fish, poultry, or lean meat: 1 serving a day Beans and legumes: 2 or more servings a week Nuts and seeds: 1-2 servings a day Whole grains: 6-8 servings a day Extra-virgin olive oil: 3-4 servings a day Limit red meat and sweets to only a few servings a month What are my food choices? Mediterranean diet Recommended Grains: Whole-grain pasta. Brown rice. Bulgar wheat. Polenta. Couscous. Whole-wheat bread. Orpah Cobbatmeal. Quinoa. Vegetables: Artichokes. Beets. Broccoli. Cabbage. Carrots. Eggplant. Green beans. Chard. Kale. Spinach. Onions. Leeks. Peas. Squash. Tomatoes. Peppers. Radishes. Fruits: Apples. Apricots. Avocado. Berries. Bananas. Cherries. Dates. Figs. Grapes. Lemons. Melon. Oranges. Peaches. Plums. Pomegranate. Meats and other protein foods: Beans. Almonds. Sunflower seeds. Pine nuts. Peanuts. Cod. Salmon. Scallops. Shrimp. Tuna. Tilapia. Clams. Oysters. Eggs. Dairy: Low-fat milk. Cheese. Greek yogurt. Beverages: Water. Red wine. Herbal tea. Fats and oils: Extra virgin olive oil. Avocado oil. Grape seed oil. Sweets and desserts: [place] yogurt with honey. Baked apples. Poached pears. Trail mix. Seasoning and other foods: Basil. Cilantro. Coriander. Cumin. Mint. Parsley. Sage. Rosemary. Tarragon. Garlic. Oregano. Thyme. Pepper. Balsalmic vinegar. Tahini. Hummus. Tomato sauce. Olives. Mushrooms. Limit these Grains: Prepackaged  pasta or rice dishes. Prepackaged cereal with added sugar. Vegetables: Deep fried potatoes (french fries). Fruits: Fruit canned in syrup. Meats and other protein foods: Beef. Pork. Lamb. Poultry with skin. Hot dogs. Tomasa BlaseBacon. Dairy: Ice cream. Sour cream. Whole milk. Beverages: Juice. Sugar-sweetened soft drinks. Beer. Liquor and spirits. Fats and oils: Butter. Canola oil. Vegetable oil. Beef fat (tallow). Lard. Sweets and desserts: Cookies. Cakes. Pies. Candy. Seasoning and other foods: Mayonnaise. Premade sauces and marinades. The items listed may not be a complete list. Talk with your dietitian about what dietary choices are right for you. Summary The Mediterranean diet includes both food and lifestyle choices. Eat a variety of fresh fruits and vegetables, beans, nuts, seeds, and whole grains. Limit the amount of red meat and sweets that you eat. Talk with your health care provider about whether it is safe for you to drink red wine in moderation. This means 1 glass a day for nonpregnant women and 2 glasses a day for men. A glass of wine equals 5 oz (150 mL). This information is not intended to replace advice given to you by your health care provider. Make sure you discuss any questions you have with your health care provider. Document Released: 07/04/2016 Document Revised: 08/06/2016 Document Reviewed: 07/04/2016 Elsevier Interactive Patient Education  2017 [employer]   We  have sent a referral to [facility] for your MRI and they will call you directly to schedule your appointment. They are located at [address] [facility]. If you need to contact them directly please call [phone].

## 2022-07-17 ENCOUNTER — Encounter: Payer: Self-pay | Admitting: Psychiatry

## 2022-07-17 ENCOUNTER — Telehealth (INDEPENDENT_AMBULATORY_CARE_PROVIDER_SITE_OTHER): Payer: Medicare Other | Admitting: Psychiatry

## 2022-07-17 DIAGNOSIS — G3184 Mild cognitive impairment, so stated: Secondary | ICD-10-CM

## 2022-07-17 DIAGNOSIS — G4701 Insomnia due to medical condition: Secondary | ICD-10-CM | POA: Diagnosis not present

## 2022-07-17 DIAGNOSIS — F3181 Bipolar II disorder: Secondary | ICD-10-CM

## 2022-07-17 DIAGNOSIS — Z79899 Other long term (current) drug therapy: Secondary | ICD-10-CM | POA: Diagnosis not present

## 2022-07-17 MED ORDER — BUPROPION HCL ER (XL) 300 MG PO TB24: 300.0000 mg | ORAL_TABLET | Freq: Every day | ORAL | 0 refills | Status: DC

## 2022-07-17 NOTE — Progress Notes (Unsigned)
Virtual Visit via Video Note  I connected with Adrian Lewis on 07/17/22 at  1:30 PM EDT by a video enabled telemedicine application and verified that I am speaking with the correct person using two identifiers.  Location Provider Location : [facility] Patient Location : Home  Participants: Patient , Provider    I discussed the limitations of evaluation and management by telemedicine and the availability of in person appointments. The patient expressed understanding and agreed to proceed.   I discussed the assessment and treatment plan with the patient. The patient was provided an opportunity to ask questions and all were answered. The patient agreed with the plan and demonstrated an understanding of the instructions.   The patient was advised to call back or seek an in-person evaluation if the symptoms worsen or if the condition fails to improve as anticipated.    [facility] MD OP Progress Note  07/18/2022 2:44 PM CASSEY HURRELL  MRN:  [id number]  Chief Complaint:  Chief Complaint  Patient presents with   Follow-up: 76 year old Caucasian male, has a history of bipolar disorder, cognitive impairment, presented for medication management.   HPI: Adrian Lewis is a 76 year old Caucasian male, retired Programmer, [employer], married, lives in [place], has a history of bipolar disorder type II, hypothyroidism, obstructive sleep apnea on CPAP, hypertension, primary osteoarthritis was evaluated by telemedicine today.  Patient today reports he has been struggling with depressive symptoms since the past several weeks.  He reports since he is unable to do the things that he used to enjoy like getting out and getting involved in certain activities he has been feeling more and more depressed.  Patient reports since the weather is too hot nowadays he has been staying indoors with his blinds closed and that does affect his mood.  He reports he however does go to the [facility] 3 times a week and he has a Patent examiner and that helps.  Patient reports he would like to work more in the yard however he wants to wait until it gets cooler.  Patient reports his wife is currently recovering from knee surgery and hence he has been taking up a lot of responsibilities.  That has been stressful as well.  Patient reports he struggles with his sleep at night mostly because of dry mouth.  He has to wake up several times due to dry mouth and sleep hence is interrupted.  He is currently working with his providers on the same.  He tried everything including mouthwashes and nothing has helped so far.  Currently he uses olive oil which helps to some extent.  Patient is currently compliant on medications.  Denies side effects.  Wonders whether his Wellbutrin dosage could be readjusted to help him more with his energy level during the day as well as motivation.  Patient denies any suicidality, homicidality or perceptual disturbances.  Patient denies any other concerns today.  Visit Diagnosis:    ICD-10-CM   1. Bipolar 2 disorder, major depressive episode (HCC)  F31.81 buPROPion (WELLBUTRIN XL) 300 MG 24 hr tablet    Lithium level    BUN+Creat    2. Insomnia due to medical condition  G47.01 buPROPion (WELLBUTRIN XL) 300 MG 24 hr tablet   OSA    3. Mild cognitive impairment  G31.84     4. High risk medication use  Z79.899 Lithium level    BUN+Creat      Past Psychiatric History: Reviewed past psychiatric history from progress note on 01/21/2022.  Past trials of medications like Lunesta, Belsomra-cost  Past Medical History:  Past Medical History:  Diagnosis Date   Anxiety    Arthritis    Bipolar 2 disorder (HCC)    Depression    GERD (gastroesophageal reflux disease)    History of kidney stones    Hypertension    Hypothyroidism    Insomnia    Macular degeneration disease    Sleep apnea    wears cpap    Past Surgical History:  Procedure Laterality Date   CATARACT EXTRACTION W/ INTRAOCULAR LENS IMPLANT   2011 (APPROX)   RIGHT EYE AND REPAIR DETACHED RETINA   CYSTOSCOPY WITH RETROGRADE PYELOGRAM, URETEROSCOPY AND STENT PLACEMENT Bilateral 02/23/2021   Procedure: CYSTOSCOPY WITH RETROGRADE PYELOGRAM, URETEROSCOPY AND STENT PLACEMENT;  Surgeon: Sebastian Ache, MD;  Location: [facility];  Service: Urology;  Laterality: Bilateral;  75 MINS   EYE SURGERY     HAMMER TOE SURGERY  2012  &  2010  (APPROX)   ONE TOE , EACH FOOT   HERNIA REPAIR Bilateral    Inguinal Hernia Repair   HOLMIUM LASER APPLICATION Bilateral 02/23/2021   Procedure: HOLMIUM LASER APPLICATION;  Surgeon: Sebastian Ache, MD;  Location: [facility];  Service: Urology;  Laterality: Bilateral;   JOINT REPLACEMENT Left 2014   Partial Knee Replacement   KNEE SURGERY Left    x 2  partial and reconstructive   NASAL TURBINATE REDUCTION Bilateral 09/27/2015   Procedure: TURBINATE REDUCTION/SUBMUCOSAL RESECTION;  Surgeon: Geanie Logan, MD;  Location: [facility];  Service: ENT;  Laterality: Bilateral;   REPAIR RIGHT INGUINAL HERNIA W/ MESH  02-14-2006   SEPTOPLASTY N/A 09/27/2015   Procedure: SEPTOPLASTY;  Surgeon: Geanie Logan, MD;  Location: [facility];  Service: ENT;  Laterality: N/A;   URETEROSCOPY  08/12/2012   Procedure: URETEROSCOPY;  Surgeon: Sebastian Ache, MD;  Location: [facility];  Service: Urology;  Laterality: Left;  1 HR     Family Psychiatric History: Reviewed family psychiatric history from progress note on 01/21/2022.  Family History:  Family History  Problem Relation Age of Onset   Heart attack Father    Suicidality Sister    Suicidality Brother     Social History: Reviewed social history from progress note on 01/21/2022. Social History   Socioeconomic History   Marital status: Married    Spouse name: Not on file   Number of children: Not on file   Years of education: 28   Highest education level: Not on file  Occupational History   Not on file  Tobacco Use   Smoking status: Never   Smokeless tobacco:  Never  Vaping Use   Vaping Use: Never used  Substance and Sexual Activity   Alcohol use: Yes    Alcohol/week: 14.0 standard drinks of alcohol    Types: 14 Cans of beer per week    Comment: everyday, scotch on occasion   Drug use: No   Sexual activity: Not Currently  Other Topics Concern   Not on file  Social History Narrative   Right handed   Drinks caffeine   Two story home   Social Determinants of Health   Financial Resource Strain: Not on file  Food Insecurity: Not on file  Transportation Needs: Not on file  Physical Activity: Not on file  Stress: Not on file  Social Connections: Not on file    Allergies:  Allergies  Allergen Reactions   Bee Venom Anaphylaxis    Has EPI pen for this  Cat Hair Extract Other (See Comments)    Per pt his nose runs/congestion.    Clonidine Other (See Comments)    fatigue fatigue fatigue    Metabolic Disorder Labs: No results found for: "HGBA1C", "MPG" No results found for: "PROLACTIN" No results found for: "CHOL", "TRIG", "HDL", "CHOLHDL", "VLDL", "LDLCALC" Lab Results  Component Value Date   TSH 1.030 03/04/2022   TSH 0.996 01/22/2022    Therapeutic Level Labs: Lab Results  Component Value Date   LITHIUM 0.6 01/22/2022   LITHIUM 0.74 06/10/2017   No results found for: "VALPROATE" No results found for: "CBMZ"  Current Medications: Current Outpatient Medications  Medication Sig Dispense Refill   Ascorbic Acid (VITAMIN C) 1000 MG tablet Take 1,000 mg by mouth daily.     b complex vitamins capsule Take 1 capsule by mouth daily.     buPROPion (WELLBUTRIN XL) 300 MG 24 hr tablet Take 1 tablet (300 mg total) by mouth daily. Stop Wellbutrin SR 200 MG 90 tablet 0   Citrus Bioflavonoids (BIOFLAVONOID CITRUS) POWD Take 1 capsule by mouth daily.     Coenzyme Q10 (COQ10 PO) Take 60 mg by mouth daily.     DYMISTA 137-50 MCG/ACT SUSP Place 1 spray into the nose daily as needed (allergies).     ergocalciferol (VITAMIN D2) 1.25 MG  (50000 UT) capsule Take 50,000 Units by mouth 2 (two) times a week.     eszopiclone (LUNESTA) 2 MG TABS tablet Take 1 tablet (2 mg total) by mouth at bedtime as needed for sleep. Take immediately before bedtime 30 tablet 0   lamoTRIgine (LAMICTAL) 100 MG tablet Take 1 tablet (100 mg total) by mouth daily. 90 tablet 1   levothyroxine (SYNTHROID, LEVOTHROID) 25 MCG tablet Take 25 mcg by mouth Daily.      lithium carbonate (ESKALITH) 450 MG CR tablet Take 1,125 mg by mouth daily. Takes 1.5 tablet daily AM and 1 tablet daily at lunch     magnesium oxide (MAG-OX) 400 MG tablet Take by mouth.     Menaquinone-7 (VITAMIN K2) 100 MCG CAPS Take 1 capsule by mouth daily.     Misc Natural Products (PROSTATE HEALTH PO) Take 1 capsule by mouth daily.     Multiple Vitamins-Minerals (OCUVITE PRESERVISION PO) Take 1 tablet by mouth daily.     Omega-3 Fatty Acids (FISH OIL) 1000 MG CAPS Take 1,000 mg by mouth daily.     OVER THE COUNTER MEDICATION Take 1 capsule by mouth daily. Adrenogen     potassium citrate (UROCIT-K) 10 MEQ (1080 MG) SR tablet 1 tablet with meals     Probiotic Product (PROBIOTIC DAILY PO) Take 1 capsule by mouth daily.     Quercetin 500 MG CAPS Take 1 capsule by mouth daily.     RESVERATROL PO Take 500 mg by mouth daily.     S-Adenosylmethionine (SAME) 400 MG TABS Take 400 mg by mouth daily.     senna-docusate (SENOKOT-S) 8.6-50 MG tablet Take 1 tablet by mouth 2 (two) times daily. While taking strongest pain meds to prevent constipation. 10 tablet 0   Tryptophan 500 MG CAPS Take 1 capsule by mouth at bedtime.     TURMERIC CURCUMIN PO Take 300 mg by mouth daily.     sodium fluoride (FLUORISHIELD) 1.1 % GEL dental gel Place onto teeth. (Patient not taking: Reported on 03/27/2022)     No current facility-administered medications for this visit.     Musculoskeletal: Strength & Muscle Tone:  UTA Gait &  Station:  Seated Patient leans: N/A  Psychiatric Specialty Exam: Review of Systems   Neurological:  Positive for tremors.  Psychiatric/Behavioral:  Positive for dysphoric mood.   All other systems reviewed and are negative.   There were no vitals taken for this visit.There is no height or weight on file to calculate BMI.  General Appearance: Casual  Eye Contact:  Fair  Speech:  Clear and Coherent  Volume:  Normal  Mood:  Depressed  Affect:  Congruent  Thought Process:  Goal Directed and Descriptions of Associations: Intact  Orientation:  Full (Time, Place, and Person)  Thought Content: Logical   Suicidal Thoughts:  No  Homicidal Thoughts:  No  Memory:  Immediate;   Fair Recent;   Fair Remote;   Limited  Judgement:  Intact  Insight:  Fair  Psychomotor Activity:  Normal  Concentration:  Concentration: Fair and Attention Span: Fair  Recall:  [employer] of Knowledge: Fair  Language: Fair  Akathisia:  No  Handed:  Right  AIMS (if indicated): not done  Assets:  Communication Skills Desire for Improvement Housing Intimacy Transportation  ADL's:  Intact  Cognition: baseline  Sleep:  Fair   Screenings: PHQ2-9    Flowsheet Row Video Visit from 07/17/2022 in [facility] Office Visit from 01/21/2022 in [facility]  PHQ-2 Total Score 3 0  PHQ-9 Total Score 9 --      Flowsheet Row Video Visit from 07/17/2022 in [facility] Pre[facility]Ad[facility] Testing 59 from 02/15/2021 in [facility]  C-SSRS RISK CATEGORY No Risk No Risk        Assessment and Plan: Adrian Lewis is a 76 year old Caucasian male who has a history of bipolar disorder type II, insomnia, cognitive disorder likely mild, presented for medication management.  Patient is currently struggling with depressive symptoms, sleep problems likely due to dry mouth, will benefit from the following plan.  Plan Bipolar disorder type II depressed-mild-unstable Will increase Wellbutrin XL to  300 mg p.o. daily in the morning Lithium 1125 mg p.o. daily Lamotrigine 100 mg p.o. daily Lithium level-01/22/2022-0.6-therapeutic.   Insomnia-unstable Due to dry mouth.  Patient to continue to seek help for the same. Continue CPAP for OSA Lunesta 2 mg p.o. nightly  Mild cognitive impairment-unstable Continue follow-up with neurology. MoCA-03/27/2022-25 out of 30.  High risk medication use-will order lithium level, BUN/creatinine.  I have advised CMA to contact patient to pick up lab slip.  Follow-up in clinic in 3 months or sooner if needed.   Consent: Patient/Guardian gives verbal consent for treatment and assignment of benefits for services provided during this visit. Patient/Guardian expressed understanding and agreed to proceed.   This note was generated in part or whole with voice recognition software. Voice recognition is usually quite accurate but there are transcription errors that can and very often do occur. I apologize for any typographical errors that were not detected and corrected.      Jomarie Longs, MD 07/18/2022, 2:44 PM

## 2022-07-19 LAB — BUN+CREAT
BUN/Creatinine Ratio: 14 (ref 10–24)
BUN: 13 mg/dL (ref 8–27)
Creatinine, Ser: 0.92 mg/dL (ref 0.76–1.27)
eGFR: 86 mL/min/{1.73_m2} (ref >59)

## 2022-09-02 ENCOUNTER — Other Ambulatory Visit: Payer: Self-pay | Admitting: Psychiatry

## 2022-09-03 LAB — LITHIUM LEVEL: Lithium Lvl: 1 mmol/L (ref 0.5–1.2)

## 2022-09-04 ENCOUNTER — Telehealth: Payer: Self-pay | Admitting: Psychiatry

## 2022-09-04 DIAGNOSIS — Z79899 Other long term (current) drug therapy: Secondary | ICD-10-CM

## 2022-09-04 DIAGNOSIS — F3181 Bipolar II disorder: Secondary | ICD-10-CM

## 2022-09-04 NOTE — Telephone Encounter (Signed)
Attempted to contact patient as well as wife since patient did not pick up his Phone, discussed lithium level which resulted on 09/02/2022.  Left a voicemail on wife's phone number.  Patient did not have voicemail set up.

## 2022-09-04 NOTE — Telephone Encounter (Signed)
Pt wife is calling dr. Shea Evans back

## 2022-09-04 NOTE — Telephone Encounter (Signed)
Reviewed discussed lithium level with patient 1 - higher than recommended for this age group.  Patient agrees to get lithium level done again since he went to the lab after 4 to 5 hours after taking his lithium the last time he went.  Patient advised to wait 10 to 12 hours after his last dosage, he could go in the morning before he takes the morning dose of lithium.  I have put the lab order in the system.  I have also printed it out for Janett Billow CMA to fax it to his [facility] of choice.  He agrees to go to the lab , morning on Friday.

## 2022-09-07 LAB — LITHIUM LEVEL: Lithium Lvl: 0.7 mmol/L (ref 0.5–1.2)

## 2022-09-12 ENCOUNTER — Telehealth (INDEPENDENT_AMBULATORY_CARE_PROVIDER_SITE_OTHER): Payer: Medicare Other | Admitting: Psychiatry

## 2022-09-12 ENCOUNTER — Encounter: Payer: Self-pay | Admitting: Psychiatry

## 2022-09-12 DIAGNOSIS — F3176 Bipolar disorder, in full remission, most recent episode depressed: Secondary | ICD-10-CM | POA: Diagnosis not present

## 2022-09-12 DIAGNOSIS — G3184 Mild cognitive impairment, so stated: Secondary | ICD-10-CM | POA: Diagnosis not present

## 2022-09-12 DIAGNOSIS — G4701 Insomnia due to medical condition: Secondary | ICD-10-CM

## 2022-09-12 DIAGNOSIS — Z79899 Other long term (current) drug therapy: Secondary | ICD-10-CM | POA: Diagnosis not present

## 2022-09-12 MED ORDER — ZALEPLON 10 MG PO CAPS: 10.0000 mg | ORAL_CAPSULE | Freq: Every evening | ORAL | 1 refills | Status: DC | PRN

## 2022-09-12 NOTE — Progress Notes (Signed)
Virtual Visit via Video Note  I connected with Adrian Lewis on 09/12/22 at  1:20 PM EDT by a video enabled telemedicine application and verified that I am speaking with the correct person using two identifiers.  Location Provider Location : [facility] Patient Location : Home  Participants: Patient , Provider    I discussed the limitations of evaluation and management by telemedicine and the availability of in person appointments. The patient expressed understanding and agreed to proceed.    I discussed the assessment and treatment plan with the patient. The patient was provided an opportunity to ask questions and all were answered. The patient agreed with the plan and demonstrated an understanding of the instructions.   The patient was advised to call back or seek an in-person evaluation if the symptoms worsen or if the condition fails to improve as anticipated.   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  Patient today reports overall mood symptoms are improving, denies any significant sadness, hopelessness or anxiety symptoms at this time.  Currently compliant on his medications.  Patient however reports he has been struggling with sleep.  The Lunesta does not seem to help with the sleep at night.  He continues to use the CPAP religiously, however that does cause dry mouth.  Patient wonders whether dry mouth could be affecting his sleep at night.  Also interested in trial of new sleep medication.  Reports he has been falling asleep during the day especially in  the afternoons due to his sleep problems at night.  Patient reports he also struggles with balance issues and fine tremors of bilateral upper extremities.  Agreeable to follow up with neurology on the same.  Patient denies any suicidality, homicidality or perceptual disturbances.  Reviewed and discussed lithium level-repeated on 09/06/2022-0.7-therapeutic.  Patient currently compliant on medications as prescribed.  Denies any other concerns today.  Visit Diagnosis:    ICD-10-CM   1. Bipolar disorder, in full remission, most recent episode depressed (HCC)  F31.76    Type 2    2. Insomnia due to medical condition  G47.01 zaleplon (SONATA) 10 MG capsule   OSA    3. Mild cognitive impairment  G31.84     4. High risk medication use  Z79.899       Past Psychiatric History: Reviewed past psychiatric history from progress note on 01/21/2022.  Past trials of medications like Lunesta, Belsomra-cost.  Past Medical History:  Past Medical History:  Diagnosis Date   Anxiety    Arthritis    Bipolar 2 disorder (HCC)    Depression    GERD (gastroesophageal reflux disease)    History of kidney stones    Hypertension    Hypothyroidism    Insomnia    Macular degeneration disease    Sleep apnea    wears cpap    Past Surgical History:  Procedure Laterality Date   CATARACT EXTRACTION W/ INTRAOCULAR LENS IMPLANT  2011 (APPROX)   RIGHT EYE AND REPAIR DETACHED RETINA   CYSTOSCOPY WITH RETROGRADE PYELOGRAM, URETEROSCOPY AND STENT PLACEMENT Bilateral 02/23/2021   Procedure: CYSTOSCOPY  WITH RETROGRADE PYELOGRAM, URETEROSCOPY AND STENT PLACEMENT;  Surgeon: Sebastian Ache, MD;  Location: [facility];  Service: Urology;  Laterality: Bilateral;  75 MINS   EYE SURGERY     HAMMER TOE SURGERY  2012  &  2010  (APPROX)   ONE TOE , EACH FOOT   HERNIA REPAIR Bilateral    Inguinal Hernia Repair   HOLMIUM LASER APPLICATION Bilateral 02/23/2021   Procedure: HOLMIUM LASER APPLICATION;  Surgeon: Sebastian Ache, MD;   Location: [facility];  Service: Urology;  Laterality: Bilateral;   JOINT REPLACEMENT Left 2014   Partial Knee Replacement   KNEE SURGERY Left    x 2  partial and reconstructive   NASAL TURBINATE REDUCTION Bilateral 09/27/2015   Procedure: TURBINATE REDUCTION/SUBMUCOSAL RESECTION;  Surgeon: Geanie Logan, MD;  Location: [facility];  Service: ENT;  Laterality: Bilateral;   REPAIR RIGHT INGUINAL HERNIA W/ MESH  02-14-2006   SEPTOPLASTY N/A 09/27/2015   Procedure: SEPTOPLASTY;  Surgeon: Geanie Logan, MD;  Location: [facility];  Service: ENT;  Laterality: N/A;   URETEROSCOPY  08/12/2012   Procedure: URETEROSCOPY;  Surgeon: Sebastian Ache, MD;  Location: [facility];  Service: Urology;  Laterality: Left;  1 HR     Family Psychiatric History: Reviewed family psychiatric history from progress note on 01/21/2022.  Family History:  Family History  Problem Relation Age of Onset   Heart attack Father    Suicidality Sister    Suicidality Brother     Social History: Reviewed social history from progress note on 01/21/2022. Social History   Socioeconomic History   Marital status: Married    Spouse name: Not on file   Number of children: Not on file   Years of education: 28   Highest education level: Not on file  Occupational History   Not on file  Tobacco Use   Smoking status: Never   Smokeless tobacco: Never  Vaping Use   Vaping Use: Never used  Substance and Sexual Activity   Alcohol use: Yes    Alcohol/week: 14.0 standard drinks of alcohol    Types: 14 Cans of beer per week    Comment: everyday, scotch on occasion   Drug use: No   Sexual activity: Not Currently  Other Topics Concern   Not on file  Social History Narrative   Right handed   Drinks caffeine   Two story home   Social Determinants of Health   Financial Resource Strain: Not on file  Food Insecurity: Not on file  Transportation Needs: Not on file  Physical Activity: Not on file  Stress: Not on file   Social Connections: Not on file    Allergies:  Allergies  Allergen Reactions   Bee Venom Anaphylaxis    Has EPI pen for this   Cat Hair Extract Other (See Comments)    Per pt his nose runs/congestion.    Clonidine Other (See Comments)    fatigue fatigue fatigue    Metabolic Disorder Labs: No results found for: "HGBA1C", "MPG" No results found for: "PROLACTIN" No results found for: "CHOL", "TRIG", "HDL", "CHOLHDL", "VLDL", "LDLCALC" Lab Results  Component Value Date   TSH 1.030 03/04/2022   TSH 0.996 01/22/2022    Therapeutic Level Labs: Lab Results  Component Value Date   LITHIUM 0.7 09/06/2022   LITHIUM 1.0 09/02/2022   No results found for: "VALPROATE" No results found for: "CBMZ"  Current Medications: Current Outpatient Medications  Medication Sig Dispense Refill   zaleplon (SONATA) 10 MG capsule Take  1 capsule (10 mg total) by mouth at bedtime as needed for sleep. 30 capsule 1   Ascorbic Acid (VITAMIN C) 1000 MG tablet Take 1,000 mg by mouth daily.     b complex vitamins capsule Take 1 capsule by mouth daily.     buPROPion (WELLBUTRIN XL) 300 MG 24 hr tablet Take 1 tablet (300 mg total) by mouth daily. Stop Wellbutrin SR 200 MG 90 tablet 0   Citrus Bioflavonoids (BIOFLAVONOID CITRUS) POWD Take 1 capsule by mouth daily.     Coenzyme Q10 (COQ10 PO) Take 60 mg by mouth daily.     DYMISTA 137-50 MCG/ACT SUSP Place 1 spray into the nose daily as needed (allergies).     ergocalciferol (VITAMIN D2) 1.25 MG (50000 UT) capsule Take 50,000 Units by mouth 2 (two) times a week.     lamoTRIgine (LAMICTAL) 100 MG tablet Take 1 tablet (100 mg total) by mouth daily. 90 tablet 1   levothyroxine (SYNTHROID, LEVOTHROID) 25 MCG tablet Take 25 mcg by mouth Daily.      lithium carbonate (ESKALITH) 450 MG CR tablet Take 1,125 mg by mouth daily. Takes 1.5 tablet daily AM and 1 tablet daily at lunch     magnesium oxide (MAG-OX) 400 MG tablet Take by mouth.     Menaquinone-7 (VITAMIN  K2) 100 MCG CAPS Take 1 capsule by mouth daily.     Misc Natural Products (PROSTATE HEALTH PO) Take 1 capsule by mouth daily.     Multiple Vitamins-Minerals (OCUVITE PRESERVISION PO) Take 1 tablet by mouth daily.     Omega-3 Fatty Acids (FISH OIL) 1000 MG CAPS Take 1,000 mg by mouth daily.     OVER THE COUNTER MEDICATION Take 1 capsule by mouth daily. Adrenogen     potassium citrate (UROCIT-K) 10 MEQ (1080 MG) SR tablet 1 tablet with meals     Probiotic Product (PROBIOTIC DAILY PO) Take 1 capsule by mouth daily.     Quercetin 500 MG CAPS Take 1 capsule by mouth daily.     RESVERATROL PO Take 500 mg by mouth daily.     S-Adenosylmethionine (SAME) 400 MG TABS Take 400 mg by mouth daily.     senna-docusate (SENOKOT-S) 8.6-50 MG tablet Take 1 tablet by mouth 2 (two) times daily. While taking strongest pain meds to prevent constipation. 10 tablet 0   sodium fluoride (FLUORISHIELD) 1.1 % GEL dental gel Place onto teeth. (Patient not taking: Reported on 03/27/2022)     Tryptophan 500 MG CAPS Take 1 capsule by mouth at bedtime.     TURMERIC CURCUMIN PO Take 300 mg by mouth daily.     No current facility-administered medications for this visit.     Musculoskeletal: Strength & Muscle Tone:  UTA Gait & Station:  Seated Patient leans: N/A  Psychiatric Specialty Exam: Review of Systems  Neurological:  Positive for tremors.  Psychiatric/Behavioral:  Positive for sleep disturbance.   All other systems reviewed and are negative.   There were no vitals taken for this visit.There is no height or weight on file to calculate BMI.  General Appearance: Casual  Eye Contact:  Good  Speech:  Normal Rate  Volume:  Normal  Mood:  Euthymic  Affect:  Congruent  Thought Process:  Goal Directed and Descriptions of Associations: Intact  Orientation:  Full (Time, Place, and Person)  Thought Content: Logical   Suicidal Thoughts:  No  Homicidal Thoughts:  No  Memory:  Immediate;   Fair Recent;   Fair Remote;  Limited  Judgement:  Fair  Insight:  Fair  Psychomotor Activity:  Tremor BL hands , fine  Concentration:  Concentration: Fair and Attention Span: Fair  Recall:  [employer] of Knowledge: Fair  Language: Fair  Akathisia:  No  Handed:  Right  AIMS (if indicated): not done  Assets:  Communication Skills Desire for Improvement Housing Social Support  ADL's:  Intact  Cognition: WNL  Sleep:  Poor   Screenings: PHQ2-9    Flowsheet Row Video Visit from 07/17/2022 in [facility] Office Visit from 01/21/2022 in [facility]  PHQ-2 Total Score 3 0  PHQ-9 Total Score 9 --      Flowsheet Row Video Visit from 09/12/2022 in [facility] Video Visit from 07/17/2022 in [facility] from 02/15/2021 in [facility] No Risk No Risk No Risk        Assessment and Plan: Adrian Lewis is a 76 year old Caucasian male who has a history of bipolar disorder type II, insomnia, cognitive disorder likely mild, presented for medication management.  Patient with sleep problems, will benefit from the following plan.  Plan Bipolar disorder type II depressed-improving Continue Wellbutrin XL 300 mg p.o. daily. Lithium 112 5 mg p.o. daily Lamotrigine 100 mg p.o. daily Lithium level-reviewed and discussed-09/06/2022-0.7-therapeutic.  Insomnia-unstable Discontinue Lunesta for lack of benefit Continue CPAP for OSA. Start Sonata 10 mg p.o. nightly Reviewed [place] PMP AWARxE   Mild cognitive impairment-unstable Continue follow-up with neurology [facility]-03/27/2022-25 out of 30.  High risk medication use-reviewed lithium level and discussed with patient-09/06/2022-0.7-therapeutic. BUN/creatinine-07/18/2022-within normal limits.  Patient encouraged to follow up with neurology/primary care provider for her balance  problem, tremors.  Follow-up in clinic in  4 weeks or sooner if needed.      Collaboration of Care: Collaboration of Care: Other encouraged to follow up with primary care provider.  Patient/Guardian was advised Release of Information must be obtained prior to any record release in order to collaborate their care with an outside provider. Patient/Guardian was advised if they have not already done so to contact the registration department to sign all necessary forms in order for [place] to release information regarding their care.   Consent: Patient/Guardian gives verbal consent for treatment and assignment of benefits for services provided during this visit. Patient/Guardian expressed understanding and agreed to proceed.   This note was generated in part or whole with voice recognition software. Voice recognition is usually quite accurate but there are transcription errors that can and very often do occur. I apologize for any typographical errors that were not detected and corrected.      Ursula Alert, MD 09/12/2022, 2:24 PM

## 2022-09-12 NOTE — Patient Instructions (Signed)
Zaleplon Capsules What is this medication? ZALEPLON (ZAL e plon) treats insomnia. It helps you go to sleep faster. It is often used for a short time. This medicine may be used for other purposes; ask your health care provider or pharmacist if you have questions. COMMON BRAND NAME(S): Sonata What should I tell my care team before I take this medication? They need to know if you have any of these conditions: Depression History of a drug or alcohol abuse problem Liver disease Lung or breathing disease Sleep-walking, driving, eating or other activity while not fully awake after taking a sleep medication Suicidal thoughts An unusual or allergic reaction to zaleplon, other medications, foods, dyes, or preservatives Pregnant or trying to get pregnant Breast-feeding How should I use this medication? Take this medication by mouth with water. Take it as directed on the label. Do not use it more often than directed. There may be unused or extra doses in the bottle after you finish your treatment. Talk to your care team if you have questions about your dose. A special MedGuide will be given to you by the pharmacist with each prescription and refill. Be sure to read this information carefully each time. Talk to your care team about the use of this medication in children. Special care may be needed. Overdosage: If you think you have taken too much of this medicine contact a poison control center or emergency room at once. NOTE: This medicine is only for you. Do not share this medicine with others. What if I miss a dose? This does not apply. This medication should only be taken immediately before going to sleep. Do not take double or extra doses. What may interact with this medication? Barbiturate medications for inducing sleep or treating seizures Carbamazepine Certain medications for allergies, like azatadine, clemastine, diphenhydramine Certain medications for depression, anxiety, or other emotional  or psychiatric problems Certain medications for pain Cimetidine Erythromycin Medications for fungal infections like ketoconazole, fluconazole, or itraconazole Other medications given for sleep Phenytoin Rifampin This list may not describe all possible interactions. Give your health care provider a list of all the medicines, herbs, non-prescription drugs, or dietary supplements you use. Also tell them if you smoke, drink alcohol, or use illegal drugs. Some items may interact with your medicine. What should I watch for while using this medication? Visit your care team for regular checks on your progress. Keep a regular sleep schedule by going to bed at about the same time each night. Avoid caffeine-containing drinks in the evening hours. When sleep medications are used every night for more than a few weeks, they may stop working. Talk to your care team if you still have trouble sleeping. After taking this medication, you may get up out of bed and do an activity that you do not know you are doing. The next morning, you may have no memory of this. Activities include driving a car ("sleep-driving"), making and eating food, talking on the phone, sexual activity, and sleep-walking. Serious injuries have occurred. Stop the medication and call your care team right away if you find out you have done any of these activities. Do not take this medication if you have used alcohol that evening. Do not take it if you have taken another medication for sleep. The risk of doing these sleep-related activities is higher. Do not take this medication unless you are able to stay in bed for a full night (7 to 8 hours) before you must be active again. You may have a   decrease in mental alertness the day after use, even if you feel that you are fully awake. Tell your care team if you will need to perform activities requiring full alertness, such as driving, the next day. Do not stand or sit up quickly after taking this medication,  especially if you are an older patient. This reduces the risk of dizzy or fainting spells. If you or your family notice any changes in your behavior, such as new or worsening depression, thoughts of harming yourself, anxiety, other unusual or disturbing thoughts, or memory loss, call your care team right away. After you stop taking this medication, you may have trouble falling asleep. This is called rebound insomnia. This problem usually goes away on its own after 1 or 2 nights. What side effects may I notice from receiving this medication? Side effects that you should report to your care team as soon as possible: Allergic reactions--skin rash, itching, hives, swelling of the face, lips, tongue, or throat Change in vision such as blurry vision, seeing halos around lights, vision loss CNS depression--slow or shallow breathing, shortness of breath, feeling faint, dizziness, confusion, difficulty staying awake Mood and behavior changes--anxiety, nervousness, confusion, hallucinations, irritability, hostility, thoughts of suicide or self-harm, worsening mood, feelings of depression Unusual sleep behaviors or activities you do not remember such as driving, eating, or sexual activity Side effects that usually do not require medical attention (report to your care team if they continue or are bothersome): Diarrhea Dizziness Drowsiness the day after use Pain, tingling, or numbness in the hands or feet This list may not describe all possible side effects. Call your doctor for medical advice about side effects. You may report side effects to FDA at 1-800-FDA-1088. Where should I keep my medication? Keep out of the reach of children and pets. This medication can be abused. Keep your medication in a safe place to protect it from theft. Do not share this medication with anyone. Selling or giving away this medication is dangerous and against the law. Store at room temperature between 20 and 25 degrees C (68 and  77 degrees F). Protect from light. This medication may cause accidental overdose and death if taken by other adults, children, or pets. Mix any unused medication with a substance like cat litter or coffee grounds. Then throw the medication away in a sealed container like a sealed bag or a coffee can with a lid. Do not use the medication after the expiration date. NOTE: This sheet is a summary. It may not cover all possible information. If you have questions about this medicine, talk to your doctor, pharmacist, or health care provider.  2023 Elsevier/Gold Standard (2020-11-13 00:00:00)  

## 2022-10-10 ENCOUNTER — Telehealth (INDEPENDENT_AMBULATORY_CARE_PROVIDER_SITE_OTHER): Payer: Medicare Other | Admitting: Psychiatry

## 2022-10-10 ENCOUNTER — Encounter: Payer: Self-pay | Admitting: Psychiatry

## 2022-10-10 ENCOUNTER — Telehealth: Payer: Self-pay | Admitting: Psychiatry

## 2022-10-10 DIAGNOSIS — G4701 Insomnia due to medical condition: Secondary | ICD-10-CM | POA: Diagnosis not present

## 2022-10-10 DIAGNOSIS — F3176 Bipolar disorder, in full remission, most recent episode depressed: Secondary | ICD-10-CM | POA: Diagnosis not present

## 2022-10-10 DIAGNOSIS — Z79899 Other long term (current) drug therapy: Secondary | ICD-10-CM | POA: Diagnosis not present

## 2022-10-10 DIAGNOSIS — G3184 Mild cognitive impairment, so stated: Secondary | ICD-10-CM | POA: Diagnosis not present

## 2022-10-10 MED ORDER — LITHIUM CARBONATE ER 450 MG PO TBCR: 450.0000 mg | EXTENDED_RELEASE_TABLET | Freq: Two times a day (BID) | ORAL | 0 refills | Status: DC

## 2022-10-10 NOTE — Telephone Encounter (Signed)
I have ordered a lithium level and BUN and creatinine since lithium dosage is being readjusted and he is on a new blood pressure medication-MiCardis which can interact with his lithium.  Will have CMA contact patient to understand his lab of choice and will fax it to [facility] Corp[facility] of choice.

## 2022-10-10 NOTE — Patient Instructions (Addendum)
Telmisartan - Lithium: (Moderate) Monitor serum lithium concentrations during concomitant angiotensin II receptor blocker use; reduce the lithium dose based on serum lithium concentration and clinical response. Concomitant use may increase steady-state lithium concentrations.   Lithium Toxicity Lithium toxicity, also called lithium poisoning, is the condition of having too much lithium in the blood. Lithium is a medicine that is used to treat bipolar disorder. Lithium toxicity can be life-threatening. What are the causes? This condition is caused by: Taking in too much lithium. This causes levels of lithium to rise in your body. Taking lithium on a regular basis and: Having a condition that raises lithium levels in your body. Taking another medicine that raises lithium levels in your body. Taking excess amounts of lithium in trying to take one's life (suicide attempt). A decrease in kidney (renal) function because of dehydration, or as a side effect of another medicine. What increases the risk? This condition is more likely to develop in people who: Are older, have lower renal function because of age, and are taking multiple medicines. Have renal disease. Have heart disease. Have dehydration that is caused by sweating or diarrhea. Have low salt (sodium) levels in the body. Certain medicines can increase the risk for this condition. They include: Water pills (diuretics). Certain medicines for high blood pressure. Nonsteroidal anti-inflammatory drugs (NSAIDs). What are the signs or symptoms? Symptoms of this condition include the following: Mild to moderate symptoms Diarrhea or nausea and vomiting. Drowsiness. Thirst. Muscle weakness or lack of coordination. Slurred speech. Urinating often. Being restless (agitation). Moderate to severe symptoms Blurred vision. Feeling giddy. Ringing in the ears. Severe muscle spasms. Seizures. Abnormal heart rhythm. Loss of consciousness or  coma. How is this diagnosed? This condition may be diagnosed based on: Your signs and symptoms. Your use of lithium. You will be asked about how much lithium you take, how long you have been taking it, and when you took your last dose. Blood tests to check the level of lithium in your blood. How is this treated? Treatment for this condition depends on how severe it is. Treatment often involves special monitoring and a hospital stay. For mild or moderate toxicity, your dosage of lithium may be reduced or stopped. For severe toxicity, lithium may be removed from your body. This is done in a hospital emergency room. It may involve: Gastric lavage. This involves placing a tube through your nose or mouth into your stomach. The tube is used to remove lithium that has not yet been absorbed. It may also be used to put medicines directly into your stomach to help stop lithium from being absorbed. Medicines that increase removal of lithium by your kidneys. Use of an artificial kidney to clean your blood (dialysis). This is usually done only in the most severe cases. Follow these instructions at home:  Take over-the-counter and prescription medicines only as told by your health care provider. Drink enough fluid to keep your urine pale yellow. If your toxicity was a result of an overdose attempt, work with a mental health care provider (psychiatrist or counselor). Do not drink alcohol. Keep all follow-up visits. This is important. How is this prevented? Lithium toxicity is preventable. To keep it from happening again: Talk with your health care provider before starting a low-sodium diet. Have your blood lithium levels checked regularly. Watch for signs and symptoms of lithium toxicity. If you have signs or symptoms, get treatment early. This can help keep severe symptoms from developing. When starting a new medicine, always ask  about the risk that it will react with your lithium. This is called an  interaction. Contact a health care provider if: You have signs or symptoms of mild or moderate lithium toxicity after you receive treatment, even if your blood lithium level is normal. Get help right away if: Your symptoms get worse. You develop new symptoms. You have signs or symptoms of severe lithium toxicity after you receive treatment. You have thoughts about harming yourself or others. You are considering suicide. If you ever feel like you may hurt yourself or others, or have thoughts about taking your own life, get help right away. Go to your nearest emergency department or: Call your local emergency services (911 in the [place] Call a suicide crisis helpline, such as the National at (828) 403-0297 or 988 in the [place]. This is open 24 hours a day in the [place] Text the Crisis Text Line at 863-194-8915 (in the [place].). These symptoms may represent a serious problem that is an emergency. Do not wait to see if the symptoms will go away. Get medical help right away. Call your local emergency services (911 in the [place] Do not drive yourself to the hospital. Summary Lithium toxicity, also called lithium poisoning, is the condition of having too much lithium in your blood. This condition results from taking too much lithium or decreased renal function that causes lithium to be removed from your body more slowly. Treatment depends on how severe your condition is. Treatment often involves special monitoring and a hospital stay. This information is not intended to replace advice given to you by your health care provider. Make sure you discuss any questions you have with your health care provider. Document Revised: 06/07/2021 Document Reviewed: 05/07/2021 Elsevier Patient Education  [place].

## 2022-10-10 NOTE — Progress Notes (Signed)
Virtual Visit via Video Note  I connected with Adrian Lewis on 10/10/22 at  9:00 AM EST by a video enabled telemedicine application and verified that I am speaking with the correct person using two identifiers.  Location Provider Location : [facility] Patient Location : Home  Participants: Patient , Provider    I discussed the limitations of evaluation and management by telemedicine and the availability of in person appointments. The patient expressed understanding and agreed to proceed.   I discussed the assessment and treatment plan with the patient. The patient was provided an opportunity to ask questions and all were answered. The patient agreed with the plan and demonstrated an understanding of the instructions.   The patient was advised to call back or seek an in-person evaluation if the symptoms worsen or if the condition fails to improve as anticipated.   BH MD OP Progress Note  10/10/2022 5:21 PM CHASON MCIVER  MRN:  [id number]  Chief Complaint:  Chief Complaint  Patient presents with   Follow-up   Anxiety   Medication Refill   Depression   HPI: Adrian Lewis is a 76 year old Caucasian male, retired, Careers information officer professor, married, lives in [place], has a history of bipolar disorder type II, hypothyroidism, obstructive sleep apnea on CPAP, hypertension, primary osteoarthritis was evaluated by telemedicine today.  Patient today reports he was recently started on Micardis for his hypertension, he is very happy with this medication since he was having trouble finding the right medication to control his blood pressure.  Reports this medication has been very helpful.  It looks like it also helps him to sleep.  Patient reports he wants to stay on this medication at this time.  Patient when advised about the side effect of Micardis, drug to drug interaction between my Cardizem and lithium,potential for  lithium toxicity on this medication, patient agreeable to reducing the  dosage of lithium and would like to monitor lithium levels rather than going off of this new antihypertensive medication.  Patient reports he does have a history of seasonal affective disorder however it usually happens in January.  Currently he does not have any depression symptoms.  Patient reports sleep is overall okay since he is currently back on the Lunesta.  Patient did not like the Sonata.  The higher dosage made him groggy the next day and the lower dosage did not work.  Patient denies any suicidality, homicidality or perceptual disturbances.  Patient denies any other concerns today.  Visit Diagnosis:    ICD-10-CM   1. Bipolar disorder, in full remission, most recent episode depressed (HCC)  F31.76 Lithium level    Renal function panel   Type 2    2. Insomnia due to medical condition  G47.01    OSA    3. Mild cognitive impairment  G31.84     4. High risk medication use  Z79.899 Lithium level    Renal function panel      Past Psychiatric History: Reviewed past psychiatric history from progress note on 01/21/2022.  Past trials of medications like Lunesta,Sonata, Belsomra-cost.  Past Medical History:  Past Medical History:  Diagnosis Date   Anxiety    Arthritis    Bipolar 2 disorder (HCC)    Depression    GERD (gastroesophageal reflux disease)    History of kidney stones    Hypertension    Hypothyroidism    Insomnia    Macular degeneration disease    Sleep apnea    wears cpap  Past Surgical History:  Procedure Laterality Date   CATARACT EXTRACTION W/ INTRAOCULAR LENS IMPLANT  2011 (APPROX)   RIGHT EYE AND REPAIR DETACHED RETINA   CYSTOSCOPY WITH RETROGRADE PYELOGRAM, URETEROSCOPY AND STENT PLACEMENT Bilateral 02/23/2021   Procedure: CYSTOSCOPY WITH RETROGRADE PYELOGRAM, URETEROSCOPY AND STENT PLACEMENT;  Surgeon: Sebastian AcheManny, Theodore, MD;  Location: [facility];  Service: Urology;  Laterality: Bilateral;  75 MINS   EYE SURGERY     HAMMER TOE SURGERY  2012  &  2010   (APPROX)   ONE TOE , EACH FOOT   HERNIA REPAIR Bilateral    Inguinal Hernia Repair   HOLMIUM LASER APPLICATION Bilateral 02/23/2021   Procedure: HOLMIUM LASER APPLICATION;  Surgeon: Sebastian AcheManny, Theodore, MD;  Location: [facility];  Service: Urology;  Laterality: Bilateral;   JOINT REPLACEMENT Left 2014   Partial Knee Replacement   KNEE SURGERY Left    x 2  partial and reconstructive   NASAL TURBINATE REDUCTION Bilateral 09/27/2015   Procedure: TURBINATE REDUCTION/SUBMUCOSAL RESECTION;  Surgeon: Geanie LoganPaul Bennett, MD;  Location: [facility];  Service: ENT;  Laterality: Bilateral;   REPAIR RIGHT INGUINAL HERNIA W/ MESH  02-14-2006   SEPTOPLASTY N/A 09/27/2015   Procedure: SEPTOPLASTY;  Surgeon: Geanie LoganPaul Bennett, MD;  Location: [facility];  Service: ENT;  Laterality: N/A;   URETEROSCOPY  08/12/2012   Procedure: URETEROSCOPY;  Surgeon: Sebastian Acheheodore Manny, MD;  [facility];  Service: Urology;  Laterality: Left;  1 HR     Family Psychiatric History: Reviewed family psychiatric history from progress note on 01/21/2022.  Family History:  Family History  Problem Relation Age of Onset   Heart attack Father    Suicidality Sister    Suicidality Brother     Social History: Reviewed social history from progress note on 01/21/2022. Social History   Socioeconomic History   Marital status: Married    Spouse name: Not on file   Number of children: Not on file   Years of education: 28   Highest education level: Not on file  Occupational History   Not on file  Tobacco Use   Smoking status: Never   Smokeless tobacco: Never  Vaping Use   Vaping Use: Never used  Substance and Sexual Activity   Alcohol use: Yes    Alcohol/week: 14.0 standard drinks of alcohol    Types: 14 Cans of beer per week    Comment: everyday, scotch on occasion   Drug use: No   Sexual activity: Not Currently  Other Topics Concern   Not on file  Social History Narrative   Right handed   Drinks caffeine   Two story home    Social Determinants of Health   Financial Resource Strain: Not on file  Food Insecurity: Not on file  Transportation Needs: Not on file  Physical Activity: Not on file  Stress: Not on file  Social Connections: Not on file    Allergies:  Allergies  Allergen Reactions   Bee Venom Anaphylaxis    Has EPI pen for this   Cat Hair Extract Other (See Comments)    Per pt his nose runs/congestion.    Clonidine Other (See Comments)    fatigue fatigue fatigue    Metabolic Disorder Labs: No results found for: "HGBA1C", "MPG" No results found for: "PROLACTIN" No results found for: "CHOL", "TRIG", "HDL", "CHOLHDL", "VLDL", "LDLCALC" Lab Results  Component Value Date   TSH 1.030 03/04/2022   TSH 0.996 01/22/2022    Therapeutic Level Labs: Lab Results  Component Value  Date   LITHIUM 0.7 09/06/2022   LITHIUM 1.0 09/02/2022   No results found for: "VALPROATE" No results found for: "CBMZ"  Current Medications: Current Outpatient Medications  Medication Sig Dispense Refill   eszopiclone (LUNESTA) 2 MG TABS tablet Take 2 mg by mouth at bedtime as needed for sleep. Take immediately before bedtime     telmisartan (MICARDIS) 20 MG tablet Take 20 mg by mouth.     Ascorbic Acid (VITAMIN C) 1000 MG tablet Take 1,000 mg by mouth daily.     b complex vitamins capsule Take 1 capsule by mouth daily.     buPROPion (WELLBUTRIN XL) 300 MG 24 hr tablet Take 1 tablet (300 mg total) by mouth daily. Stop Wellbutrin SR 200 MG 90 tablet 0   Citrus Bioflavonoids (BIOFLAVONOID CITRUS) POWD Take 1 capsule by mouth daily.     Coenzyme Q10 (COQ10 PO) Take 60 mg by mouth daily.     DYMISTA 137-50 MCG/ACT SUSP Place 1 spray into the nose daily as needed (allergies).     ergocalciferol (VITAMIN D2) 1.25 MG (50000 UT) capsule Take 50,000 Units by mouth 2 (two) times a week.     lamoTRIgine (LAMICTAL) 100 MG tablet Take 1 tablet (100 mg total) by mouth daily. 90 tablet 1   levothyroxine (SYNTHROID,  LEVOTHROID) 25 MCG tablet Take 25 mcg by mouth Daily.      lithium carbonate (ESKALITH) 450 MG CR tablet Take 1 tablet (450 mg total) by mouth 2 (two) times daily. Dose change 60 tablet 0   magnesium oxide (MAG-OX) 400 MG tablet Take by mouth.     Menaquinone-7 (VITAMIN K2) 100 MCG CAPS Take 1 capsule by mouth daily.     Misc Natural Products (PROSTATE HEALTH PO) Take 1 capsule by mouth daily.     Multiple Vitamins-Minerals (OCUVITE PRESERVISION PO) Take 1 tablet by mouth daily.     Omega-3 Fatty Acids (FISH OIL) 1000 MG CAPS Take 1,000 mg by mouth daily.     OVER THE COUNTER MEDICATION Take 1 capsule by mouth daily. Adrenogen     potassium citrate (UROCIT-K) 10 MEQ (1080 MG) SR tablet 1 tablet with meals     Probiotic Product (PROBIOTIC DAILY PO) Take 1 capsule by mouth daily.     Quercetin 500 MG CAPS Take 1 capsule by mouth daily.     RESVERATROL PO Take 500 mg by mouth daily.     S-Adenosylmethionine (SAME) 400 MG TABS Take 400 mg by mouth daily.     senna-docusate (SENOKOT-S) 8.6-50 MG tablet Take 1 tablet by mouth 2 (two) times daily. While taking strongest pain meds to prevent constipation. 10 tablet 0   sodium fluoride (FLUORISHIELD) 1.1 % GEL dental gel Place onto teeth. (Patient not taking: Reported on 03/27/2022)     Tryptophan 500 MG CAPS Take 1 capsule by mouth at bedtime.     TURMERIC CURCUMIN PO Take 300 mg by mouth daily.     No current facility-administered medications for this visit.     Musculoskeletal: Strength & Muscle Tone:  UTA Gait & Station:  Seated Patient leans: N/A  Psychiatric Specialty Exam: Review of Systems  Neurological:  Positive for tremors (Chronic).  Psychiatric/Behavioral: Negative.    All other systems reviewed and are negative.   There were no vitals taken for this visit.There is no height or weight on file to calculate BMI.  General Appearance: Casual  Eye Contact:  Fair  Speech:  Clear and Coherent  Volume:  Normal  Mood:  Euthymic   Affect:  Congruent  Thought Process:  Goal Directed and Descriptions of Associations: Intact  Orientation:  Full (Time, Place, and Person)  Thought Content: Logical   Suicidal Thoughts:  No  Homicidal Thoughts:  No  Memory:  Immediate;   Fair Recent;   Fair Remote;   limited  Judgement:  Fair  Insight:  Fair  Psychomotor Activity:  Normal  Concentration:  Concentration: Fair and Attention Span: Fair  Recall:  [employer] of Knowledge: Fair  Language: Fair  Akathisia:  No  Handed:  Right  AIMS (if indicated): not done  Assets:  Communication Skills Desire for Improvement Housing Intimacy Social Support Talents/Skills Transportation  ADL's:  Intact  Cognition: WNL  Sleep:  Fair   Screenings: PHQ2-9    Flowsheet Row Video Visit from 10/10/2022 in [facility] Video Visit from 07/17/2022 in [facility] Office Visit from 01/21/2022 in [facility]  PHQ-2 Total Score 0 3 0  PHQ-9 Total Score -- 9 --      Flowsheet Row Video Visit from 10/10/2022 in [facility] Video Visit from 09/12/2022 in [facility] Video Visit from 07/17/2022 in [facility]  C-SSRS RISK CATEGORY No Risk No Risk No Risk        Assessment and Plan: JAMEN LOISEAU is a 76 year old Caucasian male who has a history of bipolar disorder type II, insomnia, cognitive disorder likely mild, presented for medication management.  Patient with recent changes in his antihypertensive medication which does have drug to drug interaction with lithium, potential for lithium toxicity, will benefit from dose reduction of lithium and close monitoring since patient wants to stay on this particular medication regimen at this time.  Plan as noted below.  Plan Bipolar disorder type II depressed in remission Wellbutrin XL 300 mg p.o. daily Will reduce lithium  to 450 mg p.o. twice daily. Lamotrigine 100 mg p.o. daily.   Insomnia-improving Patient went back on Lunesta, currently on Lunesta 2 mg p.o. nightly Continue CPAP for OSA Discontinue Sonata for lack of benefit and side effects. Reviewed [place] PMP AWARxE  Mild cognitive impairment-chronic Continue follow-up with neurology [facility] 03/27/22 - 25/30  High risk medication use-most recent lithium level-09/06/2022-0.7-therapeutic.  However since patient is currently on this new antihypertensive-telmisartan, lithium dosage readjusted as noted above.  Will repeat lithium level in 5 days-patient provided lab slip-[facility] to fax it to [facility].  Lab order also placed in the system.  Follow-up in clinic in 4 to 6 weeks or sooner if needed.  Will coordinate care with primary care provider.  Collaboration of Care: Collaboration of Care: Primary Care Provider AEB patient advised to continue to follow-up with primary care provider.  Will coordinate care with PMD - patient gave consent.  Patient/Guardian was advised Release of Information must be obtained prior to any record release in order to collaborate their care with an outside provider. Patient/Guardian was advised if they have not already done so to contact the registration department to sign all necessary forms in order for [place] to release information regarding their care.   Consent: Patient/Guardian gives verbal consent for treatment and assignment of benefits for services provided during this visit. Patient/Guardian expressed understanding and agreed to proceed.   This note was generated in part or whole with voice recognition software. Voice recognition is usually quite accurate but there are transcription errors that can and very often do occur.  I apologize for any typographical errors that were not detected and corrected.      Jomarie Longs, MD 10/10/2022, 5:21 PM

## 2022-10-11 ENCOUNTER — Other Ambulatory Visit: Payer: Self-pay | Admitting: Psychiatry

## 2022-10-11 DIAGNOSIS — F3181 Bipolar II disorder: Secondary | ICD-10-CM

## 2022-10-11 DIAGNOSIS — F3176 Bipolar disorder, in full remission, most recent episode depressed: Secondary | ICD-10-CM

## 2022-10-11 DIAGNOSIS — G4701 Insomnia due to medical condition: Secondary | ICD-10-CM

## 2022-10-11 MED ORDER — BUPROPION HCL ER (XL) 300 MG PO TB24: 300.0000 mg | ORAL_TABLET | Freq: Every day | ORAL | 0 refills | Status: DC

## 2022-10-11 NOTE — Telephone Encounter (Signed)
I have sent Lamictal and wellbutrin XL sent to pharmacy at [facility].

## 2022-10-12 ENCOUNTER — Other Ambulatory Visit: Payer: Self-pay | Admitting: Psychiatry

## 2022-10-12 DIAGNOSIS — F3181 Bipolar II disorder: Secondary | ICD-10-CM

## 2022-10-12 DIAGNOSIS — G4701 Insomnia due to medical condition: Secondary | ICD-10-CM

## 2022-10-12 LAB — RENAL FUNCTION PANEL
Albumin: 4.4 g/dL (ref 3.8–4.8)
BUN/Creatinine Ratio: 19 (ref 10–24)
BUN: 18 mg/dL (ref 8–27)
CO2: 21 mmol/L (ref 20–29)
Calcium: 9.5 mg/dL (ref 8.6–10.2)
Chloride: 103 mmol/L (ref 96–106)
Creatinine, Ser: 0.96 mg/dL (ref 0.76–1.27)
Glucose: 103 mg/dL — ABNORMAL HIGH (ref 70–99)
Phosphorus: 4.2 mg/dL — ABNORMAL HIGH (ref 2.8–4.1)
Potassium: 4.5 mmol/L (ref 3.5–5.2)
Sodium: 138 mmol/L (ref 134–144)
eGFR: 81 mL/min/{1.73_m2} (ref >59)

## 2022-10-12 LAB — LITHIUM LEVEL: Lithium Lvl: 0.7 mmol/L (ref 0.5–1.2)

## 2022-10-14 ENCOUNTER — Telehealth: Payer: Self-pay | Admitting: Psychiatry

## 2022-10-14 DIAGNOSIS — F3176 Bipolar disorder, in full remission, most recent episode depressed: Secondary | ICD-10-CM

## 2022-10-14 MED ORDER — LITHIUM CARBONATE ER 450 MG PO TBCR: 1125.0000 mg | EXTENDED_RELEASE_TABLET | ORAL | 0 refills | Status: DC

## 2022-10-14 NOTE — Telephone Encounter (Signed)
Returned call to patient, reports he stopped the Micardis and would like to stay on the higher dosage of lithium 112 5 mg daily.  He has already started the dosage.  Discussed lithium level-0.7 therapeutic.  Patient advised about phosphorus level being high.  Patient to discuss with primary care provider.  Will fax labs results to primary care-Dr. Derrill Kay.

## 2022-10-14 NOTE — Telephone Encounter (Signed)
Attempted to contact patient to discuss this resulted on 10/11/2022 Glucose elevated at 103 Phosphorus out of range-4.2. Calcium-9.5 within normal range. Otherwise BMP within normal limits.  Lithium level-0.7-therapeutic: Could continue to monitor this frequently since he is on the Micardis which can cause lithium level to go higher.  Attempted to contact patient, had to leave a voicemail to call back.

## 2022-10-15 ENCOUNTER — Telehealth: Payer: Self-pay

## 2022-10-15 NOTE — Telephone Encounter (Signed)
faxed and confirmed labwork results sent to pcp

## 2022-10-15 NOTE — Telephone Encounter (Signed)
faxed and confirmed labwork result was sent on this date.  

## 2022-10-15 NOTE — Telephone Encounter (Signed)
faxed and confirmed labwork result was sent on this date.

## 2022-10-23 ENCOUNTER — Other Ambulatory Visit: Payer: Self-pay

## 2022-10-23 ENCOUNTER — Telehealth: Payer: Self-pay

## 2022-10-23 DIAGNOSIS — G4701 Insomnia due to medical condition: Secondary | ICD-10-CM

## 2022-10-23 DIAGNOSIS — Z79899 Other long term (current) drug therapy: Secondary | ICD-10-CM

## 2022-10-23 DIAGNOSIS — F3176 Bipolar disorder, in full remission, most recent episode depressed: Secondary | ICD-10-CM

## 2022-10-23 NOTE — Telephone Encounter (Signed)
I have placed an order for lithium level-for [facility]  Please contact patient and let him know he could go and get it done at his [facility] of choice in the next 2 to 3 weeks or so.  If he has any problem please advise him to call us back.

## 2022-10-23 NOTE — Telephone Encounter (Signed)
pt states that he is now taking telmisartan and now he needs to have his lithium levels checked each month.

## 2022-10-23 NOTE — Telephone Encounter (Signed)
Pt.notified

## 2022-11-07 LAB — LITHIUM LEVEL: Lithium Lvl: 0.8 mmol/L (ref 0.5–1.2)

## 2022-11-07 LAB — TSH: TSH: 1.31 u[IU]/mL (ref 0.450–4.500)

## 2022-11-26 ENCOUNTER — Encounter: Payer: Self-pay | Admitting: Psychiatry

## 2022-11-26 ENCOUNTER — Telehealth (INDEPENDENT_AMBULATORY_CARE_PROVIDER_SITE_OTHER): Payer: Medicare Other | Admitting: Psychiatry

## 2022-11-26 DIAGNOSIS — G3184 Mild cognitive impairment, so stated: Secondary | ICD-10-CM

## 2022-11-26 DIAGNOSIS — F3176 Bipolar disorder, in full remission, most recent episode depressed: Secondary | ICD-10-CM

## 2022-11-26 DIAGNOSIS — G4701 Insomnia due to medical condition: Secondary | ICD-10-CM | POA: Diagnosis not present

## 2022-11-26 DIAGNOSIS — Z79899 Other long term (current) drug therapy: Secondary | ICD-10-CM

## 2022-11-26 MED ORDER — LITHIUM CARBONATE ER 450 MG PO TBCR: 450.0000 mg | EXTENDED_RELEASE_TABLET | Freq: Two times a day (BID) | ORAL | 0 refills | Status: DC

## 2022-11-26 NOTE — Progress Notes (Signed)
Virtual Visit via Telephone Note  I connected with Adrian Lewis on 11/26/22 at 10:30 AM EST by telephone and verified that I am speaking with the correct person using two identifiers.  Location Provider Location : [facility] Patient Location : Home  Participants: Patient , Spouse,Provider    I discussed the limitations, risks, security and privacy concerns of performing an evaluation and management service by telephone and the availability of in person appointments. I also discussed with the patient that there may be a patient responsible charge related to this service. The patient expressed understanding and agreed to proceed.    I discussed the assessment and treatment plan with the patient. The patient was provided an opportunity to ask questions and all were answered. The patient agreed with the plan and demonstrated an understanding of the instructions.   The patient was advised to call back or seek an in-person evaluation if the symptoms worsen or if the condition fails to improve as anticipated.   BH MD OP Progress Note  11/26/2022 2:26 PM Adrian Lewis  MRN:  [id number]  Chief Complaint:  Chief Complaint  Patient presents with   Follow-up   Medication Refill   Anxiety   Depression   HPI: Adrian Lewis is a 77 year old Caucasian male, retired Programmer, [employer], married, lives in [place], has a history of bipolar disorder type II, hypothyroidism, obstructive sleep apnea on CPAP, hypertension, primary osteoarthritis was evaluated by phone today.  Patient had trouble connecting by video today.  Patient today reports he is currently in pain, likely neuropathy pain of bilateral lower extremities.  That does worry him.  He is agreeable to contact his primary care provider as well as get a new neurology referral to someone local.  He currently does have a neurologist but they are in [place].  Patient reports otherwise he has been doing fairly well with regards to his mood.   Denies any significant manic, hypomanic or depression symptoms.  Reports sleep is good.  Continues to be compliant on the CPAP.  Does take the [facility] as prescribed.  Denies side effects.  Reports currently taking a lower dosage of lithium.  Reports since he is on multiple antihypertensives which can interact with his lithium he decided to follow recommendations to lower the dosage.  Currently takes twice a day dosage of 450 mg.  Has not noticed changes in his mood symptoms since doing so.  Would like to stay on this dosage.  Discussed lithium level which came back therapeutic.  He does not really remember whether he reduced the dosage of lithium before he went for the lithium level or after.  However agreeable to repeat lithium level.  Patient denies any suicidality, homicidality or perceptual disturbances.  Patient appeared to be alert, oriented to person place time and situation.  3 word memory immediate 3 out of 3, after 5 minutes 3 out of 3.    Visit Diagnosis:    ICD-10-CM   1. Bipolar disorder, in full remission, most recent episode depressed (HCC)  F31.76 lithium carbonate (ESKALITH) 450 MG ER tablet    Lithium level   Type 2    2. Insomnia due to medical condition  G47.01    pain, mood, OSA    3. Mild cognitive impairment  G31.84     4. High risk medication use  Z79.899 Lithium level      Past Psychiatric History: Reviewed past psychiatric History from progress note on 01/21/2022.  Past trials of medications like Lunesta,  Sonata, Belsomra-cost.  Past Medical History:  Past Medical History:  Diagnosis Date   Anxiety    Arthritis    Bipolar 2 disorder (HCC)    Depression    GERD (gastroesophageal reflux disease)    History of kidney stones    Hypertension    Hypothyroidism    Insomnia    Macular degeneration disease    Sleep apnea    wears cpap    Past Surgical History:  Procedure Laterality Date   CATARACT EXTRACTION W/ INTRAOCULAR LENS IMPLANT  2011 (APPROX)    RIGHT EYE AND REPAIR DETACHED RETINA   CYSTOSCOPY WITH RETROGRADE PYELOGRAM, URETEROSCOPY AND STENT PLACEMENT Bilateral 02/23/2021   Procedure: CYSTOSCOPY WITH RETROGRADE PYELOGRAM, URETEROSCOPY AND STENT PLACEMENT;  Surgeon: Sebastian Ache, MD;  Location: [facility];  Service: Urology;  Laterality: Bilateral;  75 MINS   EYE SURGERY     HAMMER TOE SURGERY  2012  &  2010  (APPROX)   ONE TOE , EACH FOOT   HERNIA REPAIR Bilateral    Inguinal Hernia Repair   HOLMIUM LASER APPLICATION Bilateral 02/23/2021   Procedure: HOLMIUM LASER APPLICATION;  Surgeon: Sebastian Ache, MD;  Location: [facility];  Service: Urology;  Laterality: Bilateral;   JOINT REPLACEMENT Left 2014   Partial Knee Replacement   KNEE SURGERY Left    x 2  partial and reconstructive   NASAL TURBINATE REDUCTION Bilateral 09/27/2015   Procedure: TURBINATE REDUCTION/SUBMUCOSAL RESECTION;  Surgeon: Geanie Logan, MD;  Location: [facility];  Service: ENT;  Laterality: Bilateral;   REPAIR RIGHT INGUINAL HERNIA W/ MESH  02-14-2006   SEPTOPLASTY N/A 09/27/2015   Procedure: SEPTOPLASTY;  Surgeon: Geanie Logan, MD;  Location: [facility];  Service: ENT;  Laterality: N/A;   URETEROSCOPY  08/12/2012   Procedure: URETEROSCOPY;  Surgeon: Sebastian Ache, MD;  Location: [facility];  Service: Urology;  Laterality: Left;  1 HR     Family Psychiatric History: Reviewed family psychiatric history from progress note on 01/21/2022.  Family History:  Family History  Problem Relation Age of Onset   Heart attack Father    Suicidality Sister    Suicidality Brother     Social History: Reviewed social history from progress note on 01/21/2022. Social History   Socioeconomic History   Marital status: Married    Spouse name: Not on file   Number of children: Not on file   Years of education: 28   Highest education level: Not on file  Occupational History   Not on file  Tobacco Use   Smoking status: Never   Smokeless tobacco: Never  Vaping Use    Vaping Use: Never used  Substance and Sexual Activity   Alcohol use: Yes    Alcohol/week: 14.0 standard drinks of alcohol    Types: 14 Cans of beer per week    Comment: everyday, scotch on occasion   Drug use: No   Sexual activity: Not Currently  Other Topics Concern   Not on file  Social History Narrative   Right handed   Drinks caffeine   Two story home   Social Determinants of Health   Financial Resource Strain: Not on file  Food Insecurity: Not on file  Transportation Needs: Not on file  Physical Activity: Not on file  Stress: Not on file  Social Connections: Not on file    Allergies:  Allergies  Allergen Reactions   Bee Venom Anaphylaxis    Has EPI pen for this   Cat Hair Extract Other (See  Comments)    Per pt his nose runs/congestion.    Clonidine Other (See Comments)    fatigue fatigue fatigue    Metabolic Disorder Labs: No results found for: "HGBA1C", "MPG" No results found for: "PROLACTIN" No results found for: "CHOL", "TRIG", "HDL", "CHOLHDL", "VLDL", "LDLCALC" Lab Results  Component Value Date   TSH 1.310 11/06/2022   TSH 1.030 03/04/2022    Therapeutic Level Labs: Lab Results  Component Value Date   LITHIUM 0.8 11/06/2022   LITHIUM 0.7 10/11/2022   No results found for: "VALPROATE" No results found for: "CBMZ"  Current Medications: Current Outpatient Medications  Medication Sig Dispense Refill   Ascorbic Acid (VITAMIN C) 1000 MG tablet Take 1,000 mg by mouth daily.     b complex vitamins capsule Take 1 capsule by mouth daily.     buPROPion (WELLBUTRIN XL) 300 MG 24 hr tablet Take 1 tablet (300 mg total) by mouth daily. Stop Wellbutrin SR 200 MG 90 tablet 0   Citrus Bioflavonoids (BIOFLAVONOID CITRUS) POWD Take 1 capsule by mouth daily.     Coenzyme Q10 (COQ10 PO) Take 60 mg by mouth daily.     DYMISTA 137-50 MCG/ACT SUSP Place 1 spray into the nose daily as needed (allergies).     ergocalciferol (VITAMIN D2) 1.25 MG (50000 UT) capsule  Take 50,000 Units by mouth 2 (two) times a week.     eszopiclone (LUNESTA) 2 MG TABS tablet Take 2 mg by mouth at bedtime as needed for sleep. Take immediately before bedtime     lamoTRIgine (LAMICTAL) 100 MG tablet TAKE 1 TABLET BY MOUTH DAILY 90 tablet 1   levothyroxine (SYNTHROID, LEVOTHROID) 25 MCG tablet Take 25 mcg by mouth Daily.      magnesium oxide (MAG-OX) 400 MG tablet Take by mouth.     Menaquinone-7 (VITAMIN K2) 100 MCG CAPS Take 1 capsule by mouth daily.     Misc Natural Products (PROSTATE HEALTH PO) Take 1 capsule by mouth daily.     Multiple Vitamins-Minerals (OCUVITE PRESERVISION PO) Take 1 tablet by mouth daily.     Omega-3 Fatty Acids (FISH OIL) 1000 MG CAPS Take 1,000 mg by mouth daily.     OVER THE COUNTER MEDICATION Take 1 capsule by mouth daily. Adrenogen     potassium citrate (UROCIT-K) 10 MEQ (1080 MG) SR tablet 1 tablet with meals     Probiotic Product (PROBIOTIC DAILY PO) Take 1 capsule by mouth daily.     Quercetin 500 MG CAPS Take 1 capsule by mouth daily.     RESVERATROL PO Take 500 mg by mouth daily.     S-Adenosylmethionine (SAME) 400 MG TABS Take 400 mg by mouth daily.     senna-docusate (SENOKOT-S) 8.6-50 MG tablet Take 1 tablet by mouth 2 (two) times daily. While taking strongest pain meds to prevent constipation. 10 tablet 0   sodium fluoride (FLUORISHIELD) 1.1 % GEL dental gel Place onto teeth.     telmisartan (MICARDIS) 20 MG tablet Take 1 tablet by mouth daily.     Tryptophan 500 MG CAPS Take 1 capsule by mouth at bedtime.     TURMERIC CURCUMIN PO Take 300 mg by mouth daily.     celecoxib (CELEBREX) 200 MG capsule Take by mouth. (Patient not taking: Reported on 11/26/2022)     lithium carbonate (ESKALITH) 450 MG ER tablet Take 1 tablet (450 mg total) by mouth 2 (two) times daily. Dose change 180 tablet 0   No current facility-administered medications for this visit.  Musculoskeletal: Strength & Muscle Tone:  UTA Gait & Station:  Seated Patient  leans: N/A  Psychiatric Specialty Exam: Review of Systems  Musculoskeletal:  Positive for arthralgias.       BL Foot pain  Psychiatric/Behavioral: Negative.    All other systems reviewed and are negative.   There were no vitals taken for this visit.There is no height or weight on file to calculate BMI.  General Appearance:  UTA  Eye Contact:   UTA  Speech:  Clear and Coherent  Volume:  Normal  Mood:  Euthymic  Affect:   UTA  Thought Process:  Goal Directed and Descriptions of Associations: Intact  Orientation:  Full (Time, Place, and Person)  Thought Content: Logical   Suicidal Thoughts:  No  Homicidal Thoughts:  No  Memory:  Immediate;   Fair Recent;   Fair Remote;   limited  Judgement:  Fair  Insight:  Fair  Psychomotor Activity:   UTA  Concentration:  Concentration: Fair and Attention Span: Fair  Recall:  [employer] of Knowledge: Fair  Language: Fair  Akathisia:  No  Handed:  Right  AIMS (if indicated): not done  Assets:  Communication Skills Desire for Improvement Housing Intimacy Social Support Transportation  ADL's:  Intact  Cognition: WNL  Sleep:  Fair   Screenings: PHQ2-9    Flowsheet Row Video Visit from 11/26/2022 in [place] Video Visit from 10/10/2022 in [place] Video Visit from 07/17/2022 in [place] Office Visit from 01/21/2022 in [place]  PHQ-2 Total Score 0 0 3 0  PHQ-9 Total Score -- -- 9 --      Flowsheet Row Video Visit from 11/26/2022 in [place] Video Visit from 10/10/2022 in [place] Video Visit from 09/12/2022 in [place] No Risk No Risk No Risk        Assessment and Plan: LATHON ADAN is a 77 year old Caucasian male who has a history of bipolar disorder type II, insomnia, cognitive disorder  likely mild, presented for medication management.  Patient is currently on a lower dosage of lithium, although mood symptoms are stable does struggle with pain, agreeable to follow-up with neurologist.  Plan as noted below.  Plan Bipolar type II depressed in remission Wellbutrin XL 300 mg p.o. daily Lithium 450 mg p.o. twice daily Lamotrigine 100 mg p.o. daily   Insomnia-stable Lunesta 2 mg p.o. nightly Continue CPAP for OSA  Mild cognitive impairment-chronic MOCA 03/27/22- 25/30 Patient was referred for neurologist.  High risk medication use-reviewed and discussed most recent lithium level-11/23/2022-0.8-therapeutic. TSH-within normal limits Will repeat lithium level-ordered, patient to go to [facility] in the next couple of weeks.  Patient advised to follow up with primary care provider as well as neurology for pain, likely neuropathy.  Follow-up in clinic in 3 to 4 months or sooner in person.  I have spent at least 14 minutes non face to face with patient today.     Collaboration of Care: Collaboration of Care: Primary Care Provider AEB encouraged to follow up with primary care provider for neuropathy pain.  Encouraged to follow-up with neurology.  Patient/Guardian was advised Release of Information must be obtained prior to any record release in order to collaborate their care with an outside provider. Patient/Guardian was advised if they have not already done so to contact the registration department to sign all necessary forms in order for [place] to  release information regarding their care.   Consent: Patient/Guardian gives verbal consent for treatment and assignment of benefits for services provided during this visit. Patient/Guardian expressed understanding and agreed to proceed.   This note was generated in part or whole with voice recognition software. Voice recognition is usually quite accurate but there are transcription errors that can and very often do occur. I apologize for  any typographical errors that were not detected and corrected.      Ursula Alert, MD 11/26/2022, 2:26 PM

## 2022-12-03 ENCOUNTER — Encounter: Payer: Self-pay | Admitting: Physician Assistant

## 2022-12-03 ENCOUNTER — Other Ambulatory Visit (INDEPENDENT_AMBULATORY_CARE_PROVIDER_SITE_OTHER): Payer: Medicare Other

## 2022-12-03 ENCOUNTER — Ambulatory Visit (INDEPENDENT_AMBULATORY_CARE_PROVIDER_SITE_OTHER): Payer: Medicare Other | Admitting: Physician Assistant

## 2022-12-03 VITALS — BP 166/86 | HR 86 | Ht 67.0 in | Wt 176.0 lb

## 2022-12-03 DIAGNOSIS — G629 Polyneuropathy, unspecified: Secondary | ICD-10-CM

## 2022-12-03 DIAGNOSIS — M792 Neuralgia and neuritis, unspecified: Secondary | ICD-10-CM

## 2022-12-03 DIAGNOSIS — R202 Paresthesia of skin: Secondary | ICD-10-CM | POA: Insufficient documentation

## 2022-12-03 HISTORY — DX: Paresthesia of skin: R20.2

## 2022-12-03 LAB — VITAMIN B12: Vitamin B-12: 846 pg/mL (ref 211–911)

## 2022-12-03 NOTE — Patient Instructions (Addendum)
It was a pleasure to see you today at our office.   Recommendations:  Neurocognitive evaluation at our office scheduled for Feb 2024  Follow up  27 at 11:30  Check labs today  NCS/EMG for nerve pain feet  Continue PT  Follow up with ENT Decrease alcohol Go over all the multiple supplements with your doctor     RECOMMENDATIONS FOR ALL PATIENTS WITH MEMORY PROBLEMS: 1. Continue to exercise (Recommend 30 minutes of walking everyday, or 3 hours every week) 2. Increase social interactions - continue going to [place] and enjoy social gatherings with friends and family 3. Eat healthy, avoid fried foods and eat more fruits and vegetables 4. Maintain adequate blood pressure, blood sugar, and blood cholesterol level. Reducing the risk of stroke and cardiovascular disease also helps promoting better memory. 5. Avoid stressful situations. Live a simple life and avoid aggravations. Organize your time and prepare for the next day in anticipation. 6. Sleep well, avoid any interruptions of sleep and avoid any distractions in the bedroom that may interfere with adequate sleep quality 7. Avoid sugar, avoid sweets as there is a strong link between excessive sugar intake, diabetes, and cognitive impairment We discussed the Mediterranean diet, which has been shown to help patients reduce the risk of progressive memory disorders and reduces cardiovascular risk. This includes eating fish, eat fruits and green leafy vegetables, nuts like almonds and hazelnuts, walnuts, and also use olive oil. Avoid fast foods and fried foods as much as possible. Avoid sweets and sugar as sugar use has been linked to worsening of memory function.  There is always a concern of gradual progression of memory problems. If this is the case, then we may need to adjust level of care according to patient needs. Support, both to the patient and caregiver, should then be put into place.      You have been referred for a neuropsychological  evaluation (i.e., evaluation of memory and thinking abilities). Please bring someone with you to this appointment if possible, as it is helpful for the doctor to hear from both you and another adult who knows you well. Please bring eyeglasses and hearing aids if you wear them.    The evaluation will take approximately 3 hours and has two parts:   The first part is a clinical interview with the neuropsychologist (Dr. Melvyn Novas or Dr. Nicole Kindred). During the interview, the neuropsychologist will speak with you and the individual you brought to the appointment.    The second part of the evaluation is testing with the doctor's technician Hinton Dyer or Maudie Mercury). During the testing, the technician will ask you to remember different types of material, solve problems, and answer some questionnaires. Your family member will not be present for this portion of the evaluation.   Please note: We must reserve several hours of the neuropsychologist's time and the psychometrician's time for your evaluation appointment. As such, there is a No-Show fee of $100. If you are unable to attend any of your appointments, please contact our office as soon as possible to reschedule.    FALL PRECAUTIONS: Be cautious when walking. Scan the area for obstacles that may increase the risk of trips and falls. When getting up in the mornings, sit up at the edge of the bed for a few minutes before getting out of bed. Consider elevating the bed at the head end to avoid drop of blood pressure when getting up. Walk always in a well-lit room (use night lights in the walls). Avoid area  rugs or power cords from appliances in the middle of the walkways. Use a walker or a cane if necessary and consider physical therapy for balance exercise. Get your eyesight checked regularly.  FINANCIAL OVERSIGHT: Supervision, especially oversight when making financial decisions or transactions is also recommended.  HOME SAFETY: Consider the safety of the kitchen when  operating appliances like stoves, microwave oven, and blender. Consider having supervision and share cooking responsibilities until no longer able to participate in those. Accidents with firearms and other hazards in the house should be identified and addressed as well.   ABILITY TO BE LEFT ALONE: If patient is unable to contact 911 operator, consider using LifeLine, or when the need is there, arrange for someone to stay with patients. Smoking is a fire hazard, consider supervision or cessation. Risk of wandering should be assessed by caregiver and if detected at any point, supervision and safe proof recommendations should be instituted.  MEDICATION SUPERVISION: Inability to self-administer medication needs to be constantly addressed. Implement a mechanism to ensure safe administration of the medications.   DRIVING: Regarding driving, in patients with progressive memory problems, driving will be impaired. We advise to have someone else do the driving if trouble finding directions or if minor accidents are reported. Independent driving assessment is available to determine safety of driving.   If you are interested in the driving assessment, you can contact the following:  [employer] in [place] [phone]  [facility] [phone]  [facility] [phone] or [phone]    Mediterranean Diet A Mediterranean diet refers to food and lifestyle choices that are based on the traditions of countries located on the [employer]. This way of eating has been shown to help prevent certain conditions and improve outcomes for people who have chronic diseases, like kidney disease and heart disease. What are tips for following this plan? Lifestyle  Cook and eat meals together with your family, when possible. Drink enough fluid to keep your urine clear or pale yellow. Be physically active every day. This  includes: Aerobic exercise like running or swimming. Leisure activities like gardening, walking, or housework. Get 7-8 hours of sleep each night. If recommended by your health care provider, drink red wine in moderation. This means 1 glass a day for nonpregnant women and 2 glasses a day for men. A glass of wine equals 5 oz (150 mL). Reading food labels  Check the serving size of packaged foods. For foods such as rice and pasta, the serving size refers to the amount of cooked product, not dry. Check the total fat in packaged foods. Avoid foods that have saturated fat or trans fats. Check the ingredients list for added sugars, such as corn syrup. Shopping  At the grocery store, buy most of your food from the areas near the walls of the store. This includes: Fresh fruits and vegetables (produce). Grains, beans, nuts, and seeds. Some of these may be available in unpackaged forms or large amounts (in bulk). Fresh seafood. Poultry and eggs. Low-fat dairy products. Buy whole ingredients instead of prepackaged foods. Buy fresh fruits and vegetables in-season from local farmers markets. Buy frozen fruits and vegetables in resealable bags. If you do not have access to quality fresh seafood, buy precooked frozen shrimp or canned fish, such as tuna, salmon, or sardines. Buy small amounts of raw or cooked vegetables, salads, or olives from the deli or salad bar at your store. Stock your pantry so you always have certain  foods on hand, such as olive oil, canned tuna, canned tomatoes, rice, pasta, and beans. Cooking  Cook foods with extra-virgin olive oil instead of using butter or other vegetable oils. Have meat as a side dish, and have vegetables or grains as your main dish. This means having meat in small portions or adding small amounts of meat to foods like pasta or stew. Use beans or vegetables instead of meat in common dishes like chili or lasagna. Experiment with different cooking methods. Try  roasting or broiling vegetables instead of steaming or sauteing them. Add frozen vegetables to soups, stews, pasta, or rice. Add nuts or seeds for added healthy fat at each meal. You can add these to yogurt, salads, or vegetable dishes. Marinate fish or vegetables using olive oil, lemon juice, garlic, and fresh herbs. Meal planning  Plan to eat 1 vegetarian meal one day each week. Try to work up to 2 vegetarian meals, if possible. Eat seafood 2 or more times a week. Have healthy snacks readily available, such as: Vegetable sticks with hummus. Greek yogurt. Fruit and nut trail mix. Eat balanced meals throughout the week. This includes: Fruit: 2-3 servings a day Vegetables: 4-5 servings a day Low-fat dairy: 2 servings a day Fish, poultry, or lean meat: 1 serving a day Beans and legumes: 2 or more servings a week Nuts and seeds: 1-2 servings a day Whole grains: 6-8 servings a day Extra-virgin olive oil: 3-4 servings a day Limit red meat and sweets to only a few servings a month What are my food choices? Mediterranean diet Recommended Grains: Whole-grain pasta. Brown rice. Bulgar wheat. Polenta. Couscous. Whole-wheat bread. Orpah Cobb. Vegetables: Artichokes. Beets. Broccoli. Cabbage. Carrots. Eggplant. Green beans. Chard. Kale. Spinach. Onions. Leeks. Peas. Squash. Tomatoes. Peppers. Radishes. Fruits: Apples. Apricots. Avocado. Berries. Bananas. Cherries. Dates. Figs. Grapes. Lemons. Melon. Oranges. Peaches. Plums. Pomegranate. Meats and other protein foods: Beans. Almonds. Sunflower seeds. Pine nuts. Peanuts. Cod. Salmon. Scallops. Shrimp. Tuna. Tilapia. Clams. Oysters. Eggs. Dairy: Low-fat milk. Cheese. Greek yogurt. Beverages: Water. Red wine. Herbal tea. Fats and oils: Extra virgin olive oil. Avocado oil. Grape seed oil. Sweets and desserts: [place] yogurt with honey. Baked apples. Poached pears. Trail mix. Seasoning and other foods: Basil. Cilantro. Coriander. Cumin. Mint.  Parsley. Sage. Rosemary. Tarragon. Garlic. Oregano. Thyme. Pepper. Balsalmic vinegar. Tahini. Hummus. Tomato sauce. Olives. Mushrooms. Limit these Grains: Prepackaged pasta or rice dishes. Prepackaged cereal with added sugar. Vegetables: Deep fried potatoes (french fries). Fruits: Fruit canned in syrup. Meats and other protein foods: Beef. Pork. Lamb. Poultry with skin. Hot dogs. Tomasa Blase. Dairy: Ice cream. Sour cream. Whole milk. Beverages: Juice. Sugar-sweetened soft drinks. Beer. Liquor and spirits. Fats and oils: Butter. Canola oil. Vegetable oil. Beef fat (tallow). Lard. Sweets and desserts: Cookies. Cakes. Pies. Candy. Seasoning and other foods: Mayonnaise. Premade sauces and marinades. The items listed may not be a complete list. Talk with your dietitian about what dietary choices are right for you. Summary The Mediterranean diet includes both food and lifestyle choices. Eat a variety of fresh fruits and vegetables, beans, nuts, seeds, and whole grains. Limit the amount of red meat and sweets that you eat. Talk with your health care provider about whether it is safe for you to drink red wine in moderation. This means 1 glass a day for nonpregnant women and 2 glasses a day for men. A glass of wine equals 5 oz (150 mL). This information is not intended to replace advice given to you by your health care provider.  Make sure you discuss any questions you have with your health care provider. Document Released: 07/04/2016 Document Revised: 08/06/2016 Document Reviewed: 07/04/2016 Elsevier Interactive Patient Education  2017 [employer].   We have sent a referral to [facility] for your MRI and they will call you directly to schedule your appointment. They are located at [address] [facility]. If you need to contact them directly please call [phone].

## 2022-12-03 NOTE — Progress Notes (Unsigned)
Neurology clinic note  SERVICE DATE: 12/03/2022   Reason for Evaluation: Consultation requested by Adrian Pitch, MD for an opinion regarding paresthesias .     HPI: This is [name], a 77 y.o. 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 The patient is accompanied by his wife who provides additional information.. The patient has not had similar episodes of symptoms in the past.  She reports that about 6 weeks ago, he developed a burning sensation on the right foot first, and then about 2 weeks ago the same sensation was present on the left foot.  He is used to hiking, he states that it is difficult to hike or to walk, it is a burning pain of 8-9 in quality, "like electricity, burning, uncomfortable ".  He relies on walking on his heels avoiding the toes because they burn.  He is doing physical therapy, and relies on multiple natural medical products over-the-counter.  "The only thing that helps is a ball under the foot and rolling it back-and-forth ".  I cannot tie my shoes too tight or is uncomfortable ""I tried creams and did not help ".   Muscle bulk loss?  Denies Muscle pain?  Denies Cramps/Twitching?  denies  Suggestion of myotonia/difficulty relaxing after contraction?  Denies Fatigable weakness?  Denies  Does strength improve after brief exercise?  "Curling my toes and stretching and massage helps he does "Massages at [facility] in  [place] ", he is interested in light therapy  Can you arise from squatted position easily? Yes   Able to get out of chair without using arms? Yes   Able to walk up steps easily?  Yes Use an assistive device to walk? No  Significant imbalance with walking? Denies   Falls?denies  Any change in urine color, especially after exertion/physical activity?  Denies.  He is able to urinate normally The patient denies  diplopia, ptosis.  Dysphagia?  Has trouble, needs to sit forward to eat. He denies poor saliva control, dysarthria/dysphonia, impaired mastication, facial weakness/droop. There are no neuromuscular respiratory weakness symptoms, particularly orthopnea>dyspnea.  The patient has not  noticed any recent skin rashes nor does he denies report any constitutional symptoms like fever, night sweats, anorexia or unintentional weight loss. ETOH use: 14 alcoholic drinks a week   Restrictive diet? Denies . Eating soft foods after tooth extractions last week Family history of neuropathy/myopathy/NM disease?  Denies  Current medications being tried for the patient's symptoms include no oral medications, he only tried creams like Capsicin and Aspercreme which did not help;  he is adamant to not use any prescribed medications, tries to rely on natural products, but according to his wife "he goes to the [employer] store, buys over-the-counter supplement and he does not remember what the supplement is for ".  He denies new prescribed medications for at least 6 months.  He also uses ice to numb it.  "Is helping temporarily ".    MEDICATIONS:  Outpatient Encounter Medications as of 12/03/2022  Medication Sig   Ascorbic  Acid (VITAMIN C) 1000 MG tablet Take 1,000 mg by mouth daily.   b complex vitamins capsule Take 1 capsule by mouth daily.   buPROPion (WELLBUTRIN XL) 300 MG 24 hr tablet Take 1 tablet (300 mg total) by mouth daily. Stop Wellbutrin SR 200 MG   Citrus Bioflavonoids (BIOFLAVONOID CITRUS) POWD  Take 1 capsule by mouth daily.   Coenzyme Q10 (COQ10 PO) Take 60 mg by mouth daily.   DYMISTA 137-50 MCG/ACT SUSP Place 1 spray into the nose daily as needed (allergies).   ergocalciferol (VITAMIN D2) 1.25 MG (50000 UT) capsule Take 50,000 Units by mouth 2 (two) times a week.   eszopiclone (LUNESTA) 2 MG TABS tablet Take 2 mg by mouth at bedtime as needed for sleep. Take immediately before bedtime   lamoTRIgine (LAMICTAL) 100 MG tablet TAKE 1 TABLET BY MOUTH DAILY   levothyroxine (SYNTHROID, LEVOTHROID) 25 MCG tablet Take 25 mcg by mouth Daily.    lithium carbonate (ESKALITH) 450 MG ER tablet Take 1 tablet (450 mg total) by mouth 2 (two) times daily. Dose change   LOSARTAN POTASSIUM PO Take 20 mg by mouth.   magnesium oxide (MAG-OX) 400 MG tablet Take by mouth.   Menaquinone-7 (VITAMIN K2) 100 MCG CAPS Take 1 capsule by mouth daily.   Misc Natural Products (PROSTATE HEALTH PO) Take 1 capsule by mouth daily.   Multiple Vitamins-Minerals (OCUVITE PRESERVISION PO) Take 1 tablet by mouth daily.   Omega-3 Fatty Acids (FISH OIL) 1000 MG CAPS Take 1,000 mg by mouth daily.   OVER THE COUNTER MEDICATION Take 1 capsule by mouth daily. Adrenogen   potassium citrate (UROCIT-K) 10 MEQ (1080 MG) SR tablet 1 tablet with meals   Probiotic Product (PROBIOTIC DAILY PO) Take 1 capsule by mouth daily.   Quercetin 500 MG CAPS Take 1 capsule by mouth daily.   RESVERATROL PO Take 500 mg by mouth daily.   S-Adenosylmethionine (SAME) 400 MG TABS Take 400 mg by mouth daily.   senna-docusate (SENOKOT-S) 8.6-50 MG tablet Take 1 tablet by mouth 2 (two) times daily. While taking strongest pain meds to prevent constipation.   sodium fluoride (FLUORISHIELD) 1.1 % GEL dental gel Place onto teeth.   telmisartan (MICARDIS) 20 MG tablet Take 1 tablet by mouth daily.   Tryptophan 500 MG CAPS Take 1 capsule by mouth at bedtime.   TURMERIC CURCUMIN PO Take 300 mg by mouth daily.   No facility-administered encounter medications  on file as of 12/03/2022.    PAST MEDICAL HISTORY: Past Medical History:  Diagnosis Date   Anxiety    Arthritis    Bipolar 2 disorder (HCC)    Depression    GERD (gastroesophageal reflux disease)    History of kidney stones    Hypertension    Hypothyroidism    Insomnia    Macular degeneration disease    Sleep apnea    wears cpap    PAST SURGICAL HISTORY: Past Surgical History:  Procedure Laterality Date   CATARACT EXTRACTION W/ INTRAOCULAR LENS IMPLANT  2011 (APPROX)   RIGHT EYE AND REPAIR DETACHED RETINA   CYSTOSCOPY WITH RETROGRADE PYELOGRAM, URETEROSCOPY AND STENT PLACEMENT Bilateral 02/23/2021   Procedure: CYSTOSCOPY WITH RETROGRADE PYELOGRAM, URETEROSCOPY AND STENT PLACEMENT;  Surgeon: Sebastian Ache, MD;  Location: [facility];  Service: Urology;  Laterality: Bilateral;  75 MINS   EYE SURGERY     HAMMER TOE SURGERY  2012  &  2010  (APPROX)   ONE TOE , EACH FOOT   HERNIA REPAIR Bilateral  Inguinal Hernia Repair   HOLMIUM LASER APPLICATION Bilateral 02/23/2021   Procedure: HOLMIUM LASER APPLICATION;  Surgeon: Sebastian Ache, MD;  Location: [facility];  Service: Urology;  Laterality: Bilateral;   JOINT REPLACEMENT Left 2014   Partial Knee Replacement   KNEE SURGERY Left    x 2  partial and reconstructive   NASAL TURBINATE REDUCTION Bilateral 09/27/2015   Procedure: TURBINATE REDUCTION/SUBMUCOSAL RESECTION;  Surgeon: Geanie Logan, MD;  Location: [facility];  Service: ENT;  Laterality: Bilateral;   REPAIR RIGHT INGUINAL HERNIA W/ MESH  02-14-2006   SEPTOPLASTY N/A 09/27/2015   Procedure: SEPTOPLASTY;  Surgeon: Geanie Logan, MD;  Location: [facility];  Service: ENT;  Laterality: N/A;   URETEROSCOPY  08/12/2012   Procedure: URETEROSCOPY;  Surgeon: Sebastian Ache, MD;  Location: [facility];  Service: Urology;  Laterality: Left;  1 HR     ALLERGIES: Allergies  Allergen Reactions   Bee Venom Anaphylaxis    Has EPI pen for this   Cat Hair Extract Other (See Comments)     Per pt his nose runs/congestion.    Clonidine Other (See Comments)    fatigue fatigue fatigue    FAMILY HISTORY: Family History  Problem Relation Age of Onset   Heart attack Father    Suicidality Sister    Suicidality Brother     SOCIAL HISTORY: Social History   Tobacco Use   Smoking status: Never   Smokeless tobacco: Never  Vaping Use   Vaping Use: Never used  Substance Use Topics   Alcohol use: Yes    Alcohol/week: 14.0 standard drinks of alcohol    Types: 14 Cans of beer per week    Comment: everyday, scotch on occasion   Drug use: No   Social History   Social History Narrative   Right handed   Drinks caffeine   Two story home   Lives with wife in the home   retired     OBJECTIVE: PHYSICAL EXAM: BP (!) 166/86   Pulse 86   Ht 5\' 7"  (1.702 m)   Wt 176 lb (79.8 kg)   SpO2 97%   BMI 27.57 kg/m   General: General appearance: Awake and alert. No distress. Cooperative with exam.   Skin: No obvious rash or jaundice. HEENT: Atraumatic. Anicteric. Lungs: Non-labored breathing on room air  Heart: Regular Abdomen: Soft, non tender. Extremities: No edema. No obvious deformity.  Musculoskeletal: No obvious joint swelling. Psych: Anxious appearing  Neurological: Mental Status: Alert. Speech fluent. Cranial Nerves: CNII: No RAPD. Visual fields grossly intact. CNIII, IV, VI: PERRL. No nystagmus. EOMI. CN V: Facial sensation intact bilaterally to fine touch.   CN VII: Facial muscles symmetric and strong. No ptosis at rest or after sustained upgaze . CN VIII: Hearing grossly intact bilaterally. CN IX: No hypophonia. CN X: Palate elevates symmetrically. CN XI: Full strength shoulder shrug bilaterally. CN XII: Tongue protrusion full and midline. No atrophy or fasciculations. No significant dysarthria  Motor: Tone is normal. No fasciculations in all extremities. No atrophy.  Reflexes 1+ in both lower extremities, slightly less pronounced on the right than  on the left.  2/4 in the upper extremities.  Negative Babinski.   Sensation normal with pinprick, and vibration.  Responds well to temperature sensation, and proprioception.  He reports feeling uncomfortable when walking barefooted, leaning on his heels to avoid the toes. Coordination intact finger-to-nose to finger bilaterally.  Romberg negative. Gait: Able to rise from chair with arms crossed and assisted, normal narrow-base  gait, able to tandem walk, able to walk on toes and heels. Tremors: Very minimal tremor on the left greater than right, these were not present in his prior visit, however he reports that this is not bothersome, no cogwheeling is noted, tone normal.  Lab and Test Review: Lithium level 11/06/2022 0.8, TSH 1.3, A1c 5.5   Assessment and Plan   Check labs for neuropathy: B12, B6, B1, ESR, ANA, Copper , SPEP w IFE NCS/EMG for evaluation of paresthesia, neuropathy Discontinue Alcohol Continue PT.  Follow up in 6 weeks  Will discuss the results when he returns next month after neuropsychological evaluation performed for memory complaints for clarity of diagnosis  Continue mood control as per Psychiatry Discussed with the patient today's risk of taking multiple over-the-counter medications, as this can certainly have undesirable side effects.  He is keen to follow functional medicine. Recommend follow up with functional medicine physician rather than purchasing over-the-counter products without exact knowledge of what he is taking it for.  Patient verbalized understanding.      The impression above as well as the plan as outlined below were extensively discussed with the patient (in the company of his wife) who voiced understanding. All questions were answered to their satisfaction.       Total time spent  44 min   Thank you for allowing me to participate in patient's care.  If I can answer any additional questions, I would be pleased to do so.      CC: Dorothey Baseman, MD [phone] [address] [place] [address]

## 2022-12-04 ENCOUNTER — Encounter: Payer: Self-pay | Admitting: Podiatry

## 2022-12-04 ENCOUNTER — Ambulatory Visit (INDEPENDENT_AMBULATORY_CARE_PROVIDER_SITE_OTHER): Payer: Medicare Other

## 2022-12-04 ENCOUNTER — Ambulatory Visit (INDEPENDENT_AMBULATORY_CARE_PROVIDER_SITE_OTHER): Payer: Medicare Other | Admitting: Podiatry

## 2022-12-04 VITALS — BP 138/83 | HR 99

## 2022-12-04 DIAGNOSIS — M778 Other enthesopathies, not elsewhere classified: Secondary | ICD-10-CM

## 2022-12-04 NOTE — Progress Notes (Signed)
b12 is normal.

## 2022-12-04 NOTE — Progress Notes (Signed)
Subjective:  Patient ID: Adrian Lewis, male    DOB: [birth year],  MRN: [id number] HPI Chief Complaint  Patient presents with   Foot Pain    Toes and plantar foot bilateral (R>L)- aching a lot over the past months, thinks could be a combo of arthritis and neuropathy, does red light therapy, doesn't take any medications for for it, been less active now, PCP ordered a NCV testing - sched for Feb, doing light wave therapy with chiropractor and laser light which helps with pain control, only temp   New Patient (Initial Visit)    77 y.o. male presents with the above complaint.   ROS: Denies fever chills nausea vomit muscle aches pains calf pain back pain chest pain shortness of breath.  Past Medical History:  Diagnosis Date   Anxiety    Arthritis    Bipolar 2 disorder ([place])    Depression    GERD (gastroesophageal reflux disease)    History of kidney stones    Hypertension    Hypothyroidism    Insomnia    Macular degeneration disease    Sleep apnea    wears cpap   Past Surgical History:  Procedure Laterality Date   CATARACT EXTRACTION W/ INTRAOCULAR LENS IMPLANT  2011 (APPROX)   RIGHT EYE AND REPAIR DETACHED RETINA   CYSTOSCOPY WITH RETROGRADE PYELOGRAM, URETEROSCOPY AND STENT PLACEMENT Bilateral 02/23/2021   Procedure: CYSTOSCOPY WITH RETROGRADE PYELOGRAM, URETEROSCOPY AND STENT PLACEMENT;  Surgeon: Alexis Frock, MD;  Location: [facility];  Service: Urology;  Laterality: Bilateral;  [address] TOE SURGERY  2012  &  2010  (APPROX)   ONE TOE , EACH FOOT   HERNIA REPAIR Bilateral    Inguinal Hernia Repair   HOLMIUM LASER APPLICATION Bilateral 05/27/2201   Procedure: HOLMIUM LASER APPLICATION;  Surgeon: Alexis Frock, MD;  Location: [facility];  Service: Urology;  Laterality: Bilateral;   JOINT REPLACEMENT Left 2014   Partial Knee Replacement   KNEE SURGERY Left    x 2  partial and reconstructive   NASAL TURBINATE REDUCTION Bilateral 09/27/2015   Procedure:  TURBINATE REDUCTION/SUBMUCOSAL RESECTION;  Surgeon: Clyde Canterbury, MD;  Location: [facility];  Service: ENT;  Laterality: Bilateral;   REPAIR RIGHT INGUINAL HERNIA W/ MESH  02-14-2006   SEPTOPLASTY N/A 09/27/2015   Procedure: SEPTOPLASTY;  Surgeon: Clyde Canterbury, MD;  Location: [facility];  Service: ENT;  Laterality: N/A;   URETEROSCOPY  08/12/2012   Procedure: URETEROSCOPY;  Surgeon: Alexis Frock, MD;  Location: [facility];  Service: Urology;  Laterality: Left;  1 HR     Current Outpatient Medications:    Ascorbic Acid (VITAMIN C) 1000 MG tablet, Take 1,000 mg by mouth daily., Disp: , Rfl:    b complex vitamins capsule, Take 1 capsule by mouth daily., Disp: , Rfl:    buPROPion (WELLBUTRIN XL) 300 MG 24 hr tablet, Take 1 tablet (300 mg total) by mouth daily. Stop Wellbutrin SR 200 MG, Disp: 90 tablet, Rfl: 0   Coenzyme Q10 (COQ10 PO), Take 60 mg by mouth daily., Disp: , Rfl:    ergocalciferol (VITAMIN D2) 1.25 MG (50000 UT) capsule, Take 50,000 Units by mouth 2 (two) times a week., Disp: , Rfl:    eszopiclone (LUNESTA) 2 MG TABS tablet, Take 2 mg by mouth at bedtime as needed for sleep. Take immediately before bedtime, Disp: , Rfl:    lamoTRIgine (LAMICTAL) 100 MG tablet, TAKE 1 TABLET BY MOUTH DAILY, Disp:  90 tablet, Rfl: 1   levothyroxine (SYNTHROID, LEVOTHROID) 25 MCG tablet, Take 25 mcg by mouth Daily. , Disp: , Rfl:    lithium carbonate (ESKALITH) 450 MG ER tablet, Take 1 tablet (450 mg total) by mouth 2 (two) times daily. Dose change, Disp: 180 tablet, Rfl: 0   magnesium oxide (MAG-OX) 400 MG tablet, Take by mouth., Disp: , Rfl:    Menaquinone-7 (VITAMIN K2) 100 MCG CAPS, Take 1 capsule by mouth daily., Disp: , Rfl:    Misc Natural Products (PROSTATE HEALTH PO), Take 1 capsule by mouth daily., Disp: , Rfl:    Multiple Vitamins-Minerals (OCUVITE PRESERVISION PO), Take 1 tablet by mouth daily., Disp: , Rfl:    Omega-3 Fatty Acids (FISH OIL) 1000 MG CAPS, Take 1,000 mg by mouth  daily., Disp: , Rfl:    OVER THE COUNTER MEDICATION, Take 1 capsule by mouth daily. Adrenogen, Disp: , Rfl:    potassium citrate (UROCIT-K) 10 MEQ (1080 MG) SR tablet, 1 tablet with meals, Disp: , Rfl:    Probiotic Product (PROBIOTIC DAILY PO), Take 1 capsule by mouth daily., Disp: , Rfl:    Quercetin 500 MG CAPS, Take 1 capsule by mouth daily., Disp: , Rfl:    RESVERATROL PO, Take 500 mg by mouth daily., Disp: , Rfl:    S-Adenosylmethionine (SAME) 400 MG TABS, Take 400 mg by mouth daily., Disp: , Rfl:    senna-docusate (SENOKOT-S) 8.6-50 MG tablet, Take 1 tablet by mouth 2 (two) times daily. While taking strongest pain meds to prevent constipation., Disp: 10 tablet, Rfl: 0   sodium fluoride (FLUORISHIELD) 1.1 % GEL dental gel, Place onto teeth., Disp: , Rfl:    telmisartan (MICARDIS) 20 MG tablet, Take 1 tablet by mouth daily., Disp: , Rfl:    Tryptophan 500 MG CAPS, Take 1 capsule by mouth at bedtime., Disp: , Rfl:   Allergies  Allergen Reactions   Bee Venom Anaphylaxis    Has EPI pen for this   Cat Hair Extract Other (See Comments)    Per pt his nose runs/congestion.    Clonidine Other (See Comments)    fatigue fatigue fatigue   Review of Systems Objective:   Vitals:   12/04/22 1408  BP: 138/83  Pulse: 99    General: Well developed, nourished, in no acute distress, alert and oriented x3   Dermatological: Skin is warm, dry and supple bilateral. Nails x 10 are well maintained; remaining integument appears unremarkable at this time. There are no open sores, no preulcerative lesions, no rash or signs of infection present.  Vascular: Dorsalis Pedis artery and Posterior Tibial artery pedal pulses are 2/4 bilateral with immedate capillary fill time. Pedal hair growth present. No varicosities and no lower extremity edema present bilateral.   Neruologic: Grossly intact via light touch bilateral. Vibratory intact via tuning fork bilateral. Protective threshold with Semmes Wienstein  monofilament intact to all pedal sites bilateral. Patellar and Achilles deep tendon reflexes 2+ bilateral. No Babinski or clonus noted bilateral.   Musculoskeletal: No gross boney pedal deformities bilateral. No pain, crepitus, or limitation noted with foot and ankle range of motion bilateral. Muscular strength 5/5 in all groups tested bilateral.  Gait: Unassisted, Nonantalgic.    Radiographs:  Radiographs taken today demonstrate Doser severe digital deformities to the right lower extremity with overlapping toes generalized osteopenia or demineralization of the bone as well cocked up hammertoes which are resulting in dislocation and plantarflexion of the lesser metatarsals of the right foot.  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  I recommended he follow-up with neurology for this.  We did discuss oral medications over-the-counter which may include nervive and B complex vitamins.  We also discussed possible on punch biopsies for epidermal nerve fiber concentrations and we did discuss possible needs for treatment and evaluation for radiculopathy.     Azad Calame T. [place], [place]

## 2022-12-08 LAB — VITAMIN B1: Vitamin B1 (Thiamine): 214 nmol/L — ABNORMAL HIGH (ref 8–30)

## 2022-12-08 LAB — PROTEIN ELECTROPHORESIS, SERUM
Albumin ELP: 4 g/dL (ref 3.8–4.8)
Alpha 1: 0.2 g/dL (ref 0.2–0.3)
Alpha 2: 0.6 g/dL (ref 0.5–0.9)
Beta 2: 0.5 g/dL (ref 0.2–0.5)
Beta Globulin: 0.5 g/dL (ref 0.4–0.6)
Gamma Globulin: 0.9 g/dL (ref 0.8–1.7)
Total Protein: 6.8 g/dL (ref 6.1–8.1)

## 2022-12-08 LAB — COPPER, SERUM: Copper: 95 ug/dL (ref 70–175)

## 2022-12-08 LAB — VITAMIN B6: Vitamin B6: 35.4 ng/mL — ABNORMAL HIGH (ref 2.1–21.7)

## 2022-12-09 LAB — IMMUNOFIXATION, SERUM
IgA/Immunoglobulin A, Serum: 451 mg/dL — ABNORMAL HIGH (ref 61–437)
IgG (Immunoglobin G), Serum: 912 mg/dL (ref 603–1613)
IgM (Immunoglobulin M), Srm: 40 mg/dL (ref 15–143)

## 2022-12-09 LAB — ENA+DNA/DS+SJORGEN'S

## 2022-12-09 LAB — ANA+ENA+DNA/DS+SCL 70+SJOSSA/B
ANA Titer 1: NEGATIVE
ENA RNP Ab: 0.2 AI (ref 0.0–0.9)
ENA SM Ab Ser-aCnc: <0.2 AI (ref 0.0–0.9)
ENA SSA (RO) Ab: <0.2 AI (ref 0.0–0.9)
ENA SSB (LA) Ab: 1.3 AI — ABNORMAL HIGH (ref 0.0–0.9)
Scleroderma (Scl-70) (ENA) Antibody, IgG: <0.2 AI (ref 0.0–0.9)
dsDNA Ab: 2 IU/mL (ref 0–9)

## 2022-12-09 LAB — ANA W/REFLEX: Anti Nuclear Antibody (ANA): POSITIVE — AB

## 2022-12-11 ENCOUNTER — Telehealth: Payer: Self-pay

## 2022-12-11 NOTE — Telephone Encounter (Signed)
Labs report, advised of b6 to hold meds per Sharene Butters, PA-C. Thanked me for calling.

## 2022-12-17 ENCOUNTER — Encounter: Payer: Self-pay | Admitting: Physician Assistant

## 2022-12-18 ENCOUNTER — Ambulatory Visit: Payer: Medicare Other | Admitting: Physician Assistant

## 2022-12-31 ENCOUNTER — Encounter: Payer: Self-pay | Admitting: Psychology

## 2022-12-31 DIAGNOSIS — K219 Gastro-esophageal reflux disease without esophagitis: Secondary | ICD-10-CM | POA: Insufficient documentation

## 2022-12-31 DIAGNOSIS — F329 Major depressive disorder, single episode, unspecified: Secondary | ICD-10-CM | POA: Insufficient documentation

## 2022-12-31 DIAGNOSIS — H3530 Unspecified macular degeneration: Secondary | ICD-10-CM | POA: Insufficient documentation

## 2023-01-01 ENCOUNTER — Ambulatory Visit: Payer: Medicare Other | Admitting: Psychology

## 2023-01-01 ENCOUNTER — Encounter: Payer: Self-pay | Admitting: Psychology

## 2023-01-01 ENCOUNTER — Ambulatory Visit (INDEPENDENT_AMBULATORY_CARE_PROVIDER_SITE_OTHER): Payer: Medicare Other | Admitting: Psychology

## 2023-01-01 DIAGNOSIS — G3184 Mild cognitive impairment, so stated: Secondary | ICD-10-CM | POA: Insufficient documentation

## 2023-01-01 DIAGNOSIS — F411 Generalized anxiety disorder: Secondary | ICD-10-CM | POA: Diagnosis not present

## 2023-01-01 DIAGNOSIS — F330 Major depressive disorder, recurrent, mild: Secondary | ICD-10-CM

## 2023-01-01 DIAGNOSIS — F1010 Alcohol abuse, uncomplicated: Secondary | ICD-10-CM | POA: Diagnosis not present

## 2023-01-01 DIAGNOSIS — R4189 Other symptoms and signs involving cognitive functions and awareness: Secondary | ICD-10-CM

## 2023-01-01 HISTORY — DX: Alcohol abuse, uncomplicated: F10.10

## 2023-01-01 HISTORY — DX: Mild cognitive impairment of uncertain or unknown etiology: G31.84

## 2023-01-01 NOTE — Progress Notes (Signed)
   Psychometrician Note   Cognitive testing was administered to [employer] by Milana Kidney, B.S. (psychometrist) under the supervision of Dr. Christia Reading, Ph.D., licensed psychologist on 01/01/2023. Mr. Halbig did not appear overtly distressed by the testing session per behavioral observation or responses across self-report questionnaires. Rest breaks were offered.    The battery of tests administered was selected by Dr. Christia Reading, Ph.D. with consideration to Mr. Buerkle's current level of functioning, the nature of his symptoms, emotional and behavioral responses during interview, level of literacy, observed level of motivation/effort, and the nature of the referral question. This battery was communicated to the psychometrist. Communication between Dr. Christia Reading, Ph.D. and the psychometrist was ongoing throughout the evaluation and Dr. Christia Reading, Ph.D. was immediately accessible at all times. Dr. Christia Reading, Ph.D. provided supervision to the psychometrist on the date of this service to the extent necessary to assure the quality of all services provided.    Denson Niccoli Mezera will return within approximately 1-2 weeks for an interactive feedback session with Dr. Melvyn Novas at which time his test performances, clinical impressions, and treatment recommendations will be reviewed in detail. Mr. Corti understands he can contact our office should he require our assistance before this time.  A total of 145 minutes of billable time were spent face-to-face with Mr. Rowser by the psychometrist. This includes both test administration and scoring time. Billing for these services is reflected in the clinical report generated by Dr. Christia Reading, Ph.D.  This note reflects time spent with the psychometrician and does not include test scores or any clinical interpretations made by Dr. Melvyn Novas. The full report will follow in a separate note.

## 2023-01-01 NOTE — Progress Notes (Unsigned)
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 moderate sleep dysfunction.  Tables of Scores:   Note: This summary of test scores accompanies the interpretive report and should not be considered in isolation without reference to the appropriate sections in the text. Descriptors are based on appropriate normative data and may be adjusted based on clinical judgment. Terms such as "Within Normal Limits" and "Outside Normal Limits" are used when a more specific description of the test score cannot be determined.       Percentile - Normative Descriptor > 98 - Exceptionally High 91-97 - Well Above Average 75-90 - Above Average 25-74 - Average 9-24 - Below Average 2-8 - Well Below Average < 2 - Exceptionally Low       Orientation:      Raw Score Percentile   NAB Orientation, Form 1 27/29 --- ---       Cognitive Screening:      Raw Score Percentile   SLUMS: 28/30 --- ---       Intellectual Functioning:      Standard Score Percentile   Test of Premorbid Functioning: 119 90 Above Average       Memory:     NAB Memory Module, Form 1: Standard Score/ T Score Percentile   Total Memory Index 92 30 Average  List Learning       Total Trials 1-3 14/36 (34) 5 Well Below Average    List B 4/12 (49)  46 Average    Short Delay Free Recall 3/12 (31) 3 Well Below Average    Long Delay Free Recall 5/12 (42) 21 Below Average    Retention Percentage 167 (68) 96 Well Above Average    Recognition Discriminability 5 (48) 42 Average  Shape Learning       Total Trials 1-3 17/27 (56) 73 Average    Delayed Recall 6/9 (54) 66 Average    Retention Percentage 86 (45) 31 Average    Recognition Discriminability 5 (44) 27 Average  Story Learning       Immediate Recall 58/80 (48) 42 Average    Delayed Recall 33/40 (53) 62 Average    Retention Percentage 97 (55) 69 Average  Daily Living Memory       Immediate Recall 40/51 (47) 38 Average    Delayed Recall 15/17 (56) 73 Average    Retention Percentage 94 (56) 73 Average    Recognition Hits 9/10 (55) 69 Average       Attention/Executive Function:     Trail Making Test (TMT): Raw Score (T Score) Percentile     Part A 78 secs.,  0 errors (25) 1 Exceptionally Low    Part B Discontinued --- Impaired  Scaled Score Percentile   WAIS-IV Coding: 7 16 Below Average       NAB Attention Module, Form 1: T Score Percentile     Digits Forward 45 31 Average    Digits Backwards 50 50 Average        Scaled Score Percentile   WAIS-IV Similarities: 11 63 Average       D-KEFS Color-Word Interference Test: Raw Score (Scaled Score) Percentile     Color Naming 39 secs. (8) 25 Average    Word Reading 21 secs. (12) 75 Above Average    Inhibition 102 secs. (5) 5 Well Below Average      Total Errors 6 errors (7) 16 Below Average    Inhibition/Switching 62 secs. (12) 75 Above Average      Total Errors 2 errors (11) 63 Average       D-KEFS Verbal Fluency Test: Raw Score (Scaled Score) Percentile     Letter Total Correct 30 (9) 37 Average    Category Total Correct 39 (12) 75 Above Average    Category Switching Total Correct 10 (8) 25 Average    Category Switching Accuracy 8 (7) 16 Below Average      Total Set Loss Errors 2 (10) 50 Average      Total  Repetition Errors 7 (6) 9 Below Average       NAB Executive Functions Module, Form 1: T Score Percentile     Judgment 64 92 Well Above Average       Language:     Verbal Fluency Test: Raw Score (T Score) Percentile     Phonemic Fluency (FAS) 30 (40) 16 Below Average    Animal Fluency 24 (59) 82 Above Average        NAB Language Module, Form 1: T Score Percentile     Auditory Comprehension 56 73 Average    Naming 31/31 (59) 82 Above Average       Visuospatial/Visuoconstruction:      Raw Score Percentile   Clock Drawing: 9/10 --- Within Normal Limits       NAB Spatial Module, Form 1: T Score Percentile     Figure Drawing Copy 41 18 Below Average        Scaled Score Percentile   WAIS-IV Block Design: 13 84 Above Average       Mood and Personality:      Raw Score Percentile   Geriatric Depression Scale: 16 --- Mild  Geriatric Anxiety Scale: 12 --- Mild    Somatic 8 --- Mild    Cognitive 2 --- Minimal    Affective 2 --- Minimal       Additional Questionnaires:      Raw Score Percentile   PROMIS Sleep Disturbance Questionnaire: 31 --- Moderate   Informed Consent and Coding/Compliance:   The current evaluation represents a clinical evaluation for the purposes previously outlined by the referral source and is in no way reflective of a forensic evaluation.   Adrian Lewis was provided with a verbal description of the nature and purpose of the present neuropsychological evaluation. Also reviewed were the foreseeable risks and/or discomforts and benefits of the procedure, limits of confidentiality, and mandatory reporting requirements of this provider. The patient was given the opportunity to ask questions and receive answers about the evaluation. Oral consent to participate was provided by the patient.   This evaluation was conducted by Christia Reading, Ph.D., ABPP-CN, board certified clinical neuropsychologist. Adrian Lewis completed a clinical interview with Dr. Melvyn Novas, billed  as one unit  [id number], and 145 minutes of cognitive testing and scoring, billed as one unit [phone] and four additional units 96139. Psychometrist Wallace Keller, B.S., assisted Dr. Milbert Coulter with test administration and scoring procedures. As a separate and discrete service, Dr. Milbert Coulter spent a total of 160 minutes in interpretation and report writing billed as one unit [phone] and two units 96133.

## 2023-01-02 ENCOUNTER — Ambulatory Visit (INDEPENDENT_AMBULATORY_CARE_PROVIDER_SITE_OTHER): Payer: Medicare Other | Admitting: Neurology

## 2023-01-02 ENCOUNTER — Encounter: Payer: Self-pay | Admitting: Psychology

## 2023-01-02 DIAGNOSIS — M792 Neuralgia and neuritis, unspecified: Secondary | ICD-10-CM | POA: Diagnosis not present

## 2023-01-02 DIAGNOSIS — M5417 Radiculopathy, lumbosacral region: Secondary | ICD-10-CM

## 2023-01-02 DIAGNOSIS — G629 Polyneuropathy, unspecified: Secondary | ICD-10-CM

## 2023-01-02 NOTE — Procedures (Signed)
[facility]  [address], [address]  [place], [place] [address] Tel: [phone] Fax: [phone] Test Date:  01/02/2023  Patient: Adrian Lewis DOB: [birth year] Physician: Narda Amber, DO  Sex: Male Height: 5\' 7"  Ref Phys: Sharene Butters, PA-C  ID#: [id number]   Technician:    History: This is a 77 year old man referred for evaluation of generalized paresthesias of the feet and legs.  NCV & EMG Findings: Extensive electrodiagnostic testing of the right lower extremity and additional studies of the left shows:  Bilateral superficial peroneal sensory responses are absent.  Bilateral sural sensory responses are within normal limits. Left peroneal motor response shows reduced amplitude at the extensor digitorum brevis (1.8 mV), and is normal at the tibialis anterior.  Right peroneal and bilateral tibial motor responses are within normal limits. Bilateral tibial H reflex studies are within normal limits. Chronic motor axonal loss changes are seen affecting bilateral L3-4 myotomes and the left L5 myotome, without accompanying active denervation.   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 Chronic L5 radiculopathy affecting the left lower extremity, moderate.   ___________________________ Narda Amber, DO    Nerve Conduction Studies   Stim Site NR Peak (ms) Norm Peak (ms) O-P Amp (V) Norm O-P Amp  Left Sup Peroneal Anti Sensory (Ant Lat Mall)  33 C  12 cm *NR  <4.6  >3  Right Sup Peroneal Anti Sensory (Ant Lat Mall)  33 C  12 cm *NR  <4.6  >3  Left Sural Anti Sensory (Lat Mall)  33 C  Calf    3.4 <4.6 3.6 >3  Right Sural Anti Sensory (Lat Mall)  33 C  Calf    3.2 <4.6 6.5 >3     Stim Site NR Onset (ms) Norm Onset (ms) O-P Amp (mV) Norm O-P Amp Site1 Site2 Delta-0 (ms) Dist (cm)  Vel (m/s) Norm Vel (m/s)  Left Peroneal Motor (Ext Dig Brev)  33 C  Ankle    4.0 <6.0 *1.8 >2.5 B Fib Ankle 9.4 38.0 40 >40  B Fib    13.4  1.7  Poplt B Fib 2.0 8.0 40 >40  Poplt    15.4  1.4         Right Peroneal Motor (Ext Dig Brev)  33 C  Ankle    3.4 <6.0 6.9 >2.5 B Fib Ankle 8.5 39.0 46 >40  B Fib    11.9  6.5  Poplt B Fib 1.7 8.0 47 >40  Poplt    13.6  6.4         Left Peroneal TA Motor (Tib Ant)  33 C  Fib Head    3.1 <4.5 4.5 >3 Poplit Fib Head 1.9 8.0 42 >40  Poplit    5.0 <5.7 4.3         Left Tibial Motor (Abd Hall Brev)  33 C  Ankle    3.2 <6.0 4.4 >4  Ankle 9.6 45.0 42 >40  Site 3    13.2  3.3         Right Tibial Motor (Abd Hall Brev)  33 C  Ankle    3.4 <6.0 6.4 >4 Knee Ankle 9.8 45.0 46 >40  Knee    13.2  5.7          Electromyography   Side Muscle Ins.Act Fibs Fasc Recrt Amp Dur Poly Activation Comment  Right AntTibialis Nml  Nml Nml Nml Nml Nml Nml Nml N/A  Right Gastroc Nml Nml Nml Nml Nml Nml Nml Nml N/A  Right Flex Dig Long Nml Nml Nml Nml Nml Nml Nml Nml N/A  Right RectFemoris Nml Nml Nml *1- *1+ *1+ *1+ Nml N/A  Right BicepsFemS Nml Nml Nml Nml Nml Nml Nml Nml N/A  Right GluteusMed Nml Nml Nml Nml Nml Nml Nml Nml N/A  Left AntTibialis Nml Nml Nml *1- *1+ *1+ *1+ Nml N/A  Left Gastroc Nml Nml Nml Nml Nml Nml Nml Nml N/A  Left Flex Dig Long Nml Nml Nml *2- *1+ *1+ *1+ Nml N/A  Left RectFemoris Nml Nml Nml *1- *1+ *1+ *1+ Nml N/A  Left GluteusMed Nml Nml Nml *1- *1+ *1+ *1+ Nml N/A  Right AdductorLong Nml Nml Nml *1- *1+ *1+ *1+ Nml N/A      Waveforms:

## 2023-01-08 ENCOUNTER — Ambulatory Visit (INDEPENDENT_AMBULATORY_CARE_PROVIDER_SITE_OTHER): Payer: Medicare Other | Admitting: Psychology

## 2023-01-08 DIAGNOSIS — G3184 Mild cognitive impairment, so stated: Secondary | ICD-10-CM

## 2023-01-08 DIAGNOSIS — F1010 Alcohol abuse, uncomplicated: Secondary | ICD-10-CM | POA: Diagnosis not present

## 2023-01-08 NOTE — Progress Notes (Signed)
   Neuropsychology Feedback Session Tillie Rung. [place] Department of Neurology  Reason for Referral:   Adrian Lewis is a 77 y.o. right-handed Caucasian male referred by Sharene Butters, PA-C, to characterize his current cognitive functioning and assist with diagnostic clarity and treatment planning in the context of subjective cognitive decline.   Feedback:   Adrian Lewis completed a comprehensive neuropsychological evaluation on 01/01/2023. Please refer to that encounter for the full report and recommendations. Briefly, results suggested performance variability across processing speed and executive functioning. More subtle variability was exhibited across verbal fluency and visuospatial abilities; however, these performances remained adequate relative to age-matched peers. While he exhibited a weakness learning an unstructured list of words, his retention rate was strong and performances across all other memory tasks were consistently appropriate. The etiology for ongoing dysfunction is unclear at the present time. Given Dr. Thomasenia Sales high level of educational attainment and premorbid intellectual estimations, there remains the potential that performances in the average normative range could represent subtle decline from a previously higher degree of functioning. However, there is no prior testing to confirm this. Functional concerns raised by his wife and his very strong propensity for repetition without appreciation in conversation could be worrisome for the extremely early beginnings of a neurodegenerative illness. However, memory performances across testing were largely appropriate with age-matched peers and there are no current patterns across testing that would signal the need for more advanced concern at the present time. As the potential for gradual progression over time remains, continued monitoring will be important moving forward.   Adrian Lewis was accompanied by his wife  during the current feedback session. Content of the current session focused on the results of his neuropsychological evaluation. Adrian Lewis was given the opportunity to ask questions and his questions were answered. He was encouraged to reach out should additional questions arise. A copy of his report was provided at the conclusion of the visit.      42 minutes were spent conducting the current feedback session with Adrian Lewis, billed as one unit [phone].

## 2023-01-20 ENCOUNTER — Ambulatory Visit: Payer: Medicare Other | Admitting: Physician Assistant

## 2023-01-21 ENCOUNTER — Encounter: Payer: Self-pay | Admitting: Physician Assistant

## 2023-01-21 ENCOUNTER — Ambulatory Visit (INDEPENDENT_AMBULATORY_CARE_PROVIDER_SITE_OTHER): Payer: Medicare Other | Admitting: Physician Assistant

## 2023-01-21 VITALS — BP 158/77 | HR 74 | Resp 20 | Ht 67.0 in | Wt 175.0 lb

## 2023-01-21 DIAGNOSIS — M3500 Sicca syndrome, unspecified: Secondary | ICD-10-CM

## 2023-01-21 DIAGNOSIS — G3184 Mild cognitive impairment, so stated: Secondary | ICD-10-CM | POA: Diagnosis not present

## 2023-01-21 MED ORDER — GABAPENTIN 100 MG PO CAPS: ORAL_CAPSULE | ORAL | 3 refills | Status: DC

## 2023-01-21 NOTE — Patient Instructions (Addendum)
It was a pleasure to see you today at our office.   Recommendations:    Discontinue alcohol Go over all the multiple supplements with your doctor  Tue Sep 10 at 11:30 follow up for memory  Recommend the use of CPAP for OSA Recommend hearing evaluation for hearing aids Repeat neurocognitive testing in 12 to 18 months Need to stop anything with B6, can stop B1  Stop K2 vitamin  Referral to rheumatology in [place] and Pain clinic for bilateral foot pain,  Follow up with Psychiatry  for mood  Gabapentin 100 mg at night for 1 week, then increase to two times a day and then after 1 week  increase to 3 times a day      RECOMMENDATIONS FOR ALL PATIENTS WITH MEMORY PROBLEMS: 1. Continue to exercise (Recommend 30 minutes of walking everyday, or 3 hours every week) 2. Increase social interactions - continue going to [place] and enjoy social gatherings with friends and family 3. Eat healthy, avoid fried foods and eat more fruits and vegetables 4. Maintain adequate blood pressure, blood sugar, and blood cholesterol level. Reducing the risk of stroke and cardiovascular disease also helps promoting better memory. 5. Avoid stressful situations. Live a simple life and avoid aggravations. Organize your time and prepare for the next day in anticipation. 6. Sleep well, avoid any interruptions of sleep and avoid any distractions in the bedroom that may interfere with adequate sleep quality 7. Avoid sugar, avoid sweets as there is a strong link between excessive sugar intake, diabetes, and cognitive impairment We discussed the Mediterranean diet, which has been shown to help patients reduce the risk of progressive memory disorders and reduces cardiovascular risk. This includes eating fish, eat fruits and green leafy vegetables, nuts like almonds and hazelnuts, walnuts, and also use olive oil. Avoid fast foods and fried foods as much as possible. Avoid sweets and sugar as sugar use has been linked to  worsening of memory function.  There is always a concern of gradual progression of memory problems. If this is the case, then we may need to adjust level of care according to patient needs. Support, both to the patient and caregiver, should then be put into place.      You have been referred for a neuropsychological evaluation (i.e., evaluation of memory and thinking abilities). Please bring someone with you to this appointment if possible, as it is helpful for the doctor to hear from both you and another adult who knows you well. Please bring eyeglasses and hearing aids if you wear them.    The evaluation will take approximately 3 hours and has two parts:   The first part is a clinical interview with the neuropsychologist (Dr. Melvyn Novas or Dr. Nicole Kindred). During the interview, the neuropsychologist will speak with you and the individual you brought to the appointment.    The second part of the evaluation is testing with the doctor's technician Hinton Dyer or Maudie Mercury). During the testing, the technician will ask you to remember different types of material, solve problems, and answer some questionnaires. Your family member will not be present for this portion of the evaluation.   Please note: We must reserve several hours of the neuropsychologist's time and the psychometrician's time for your evaluation appointment. As such, there is a No-Show fee of $100. If you are unable to attend any of your appointments, please contact our office as soon as possible to reschedule.    FALL PRECAUTIONS: Be cautious when walking. Scan the area for obstacles  that may increase the risk of trips and falls. When getting up in the mornings, sit up at the edge of the bed for a few minutes before getting out of bed. Consider elevating the bed at the head end to avoid drop of blood pressure when getting up. Walk always in a well-lit room (use night lights in the walls). Avoid area rugs or power cords from appliances in the middle of the  walkways. Use a walker or a cane if necessary and consider physical therapy for balance exercise. Get your eyesight checked regularly.  FINANCIAL OVERSIGHT: Supervision, especially oversight when making financial decisions or transactions is also recommended.  HOME SAFETY: Consider the safety of the kitchen when operating appliances like stoves, microwave oven, and blender. Consider having supervision and share cooking responsibilities until no longer able to participate in those. Accidents with firearms and other hazards in the house should be identified and addressed as well.   ABILITY TO BE LEFT ALONE: If patient is unable to contact 911 operator, consider using LifeLine, or when the need is there, arrange for someone to stay with patients. Smoking is a fire hazard, consider supervision or cessation. Risk of wandering should be assessed by caregiver and if detected at any point, supervision and safe proof recommendations should be instituted.  MEDICATION SUPERVISION: Inability to self-administer medication needs to be constantly addressed. Implement a mechanism to ensure safe administration of the medications.   DRIVING: Regarding driving, in patients with progressive memory problems, driving will be impaired. We advise to have someone else do the driving if trouble finding directions or if minor accidents are reported. Independent driving assessment is available to determine safety of driving.   If you are interested in the driving assessment, you can contact the following:  [facility] in [place]  [place] [phone] or [phone]    [place] refers to food and lifestyle choices that are based on the traditions of countries located on the [employer]. This way of eating has been shown to help prevent certain conditions and  improve outcomes for people who have chronic diseases, like kidney disease and heart disease. What are tips for following this plan? Lifestyle  Cook and eat meals together with your family, when possible. Drink enough fluid to keep your urine clear or pale yellow. Be physically active every day. This includes: Aerobic exercise like running or swimming. Leisure activities like gardening, walking, or housework. Get 7-8 hours of sleep each night. If recommended by your health care provider, drink red wine in moderation. This means 1 glass a day for nonpregnant women and 2 glasses a day for men. A glass of wine equals 5 oz (150 mL). Reading food labels  Check the serving size of packaged foods. For foods such as rice and pasta, the serving size refers to the amount of cooked product, not dry. Check the total fat in packaged foods. Avoid foods that have saturated fat or trans fats. Check the ingredients list for added sugars, such as corn syrup. Shopping  At the grocery store, buy most of your food from the areas near the walls of the store. This includes: Fresh fruits and vegetables (produce). Grains, beans, nuts, and seeds. Some of these may be available in unpackaged forms or large amounts (in bulk). Fresh seafood. Poultry and eggs. Low-fat dairy products. Buy whole ingredients instead of prepackaged foods. Buy fresh fruits and vegetables in-season  from local farmers markets. Buy frozen fruits and vegetables in resealable bags. If you do not have access to quality fresh seafood, buy precooked frozen shrimp or canned fish, such as tuna, salmon, or sardines. Buy small amounts of raw or cooked vegetables, salads, or olives from the deli or salad bar at your store. Stock your pantry so you always have certain foods on hand, such as olive oil, canned tuna, canned tomatoes, rice, pasta, and beans. Cooking  Cook foods with extra-virgin olive oil instead of using butter or other vegetable  oils. Have meat as a side dish, and have vegetables or grains as your main dish. This means having meat in small portions or adding small amounts of meat to foods like pasta or stew. Use beans or vegetables instead of meat in common dishes like chili or lasagna. Experiment with different cooking methods. Try roasting or broiling vegetables instead of steaming or sauteing them. Add frozen vegetables to soups, stews, pasta, or rice. Add nuts or seeds for added healthy fat at each meal. You can add these to yogurt, salads, or vegetable dishes. Marinate fish or vegetables using olive oil, lemon juice, garlic, and fresh herbs. Meal planning  Plan to eat 1 vegetarian meal one day each week. Try to work up to 2 vegetarian meals, if possible. Eat seafood 2 or more times a week. Have healthy snacks readily available, such as: Vegetable sticks with hummus. Greek yogurt. Fruit and nut trail mix. Eat balanced meals throughout the week. This includes: Fruit: 2-3 servings a day Vegetables: 4-5 servings a day Low-fat dairy: 2 servings a day Fish, poultry, or lean meat: 1 serving a day Beans and legumes: 2 or more servings a week Nuts and seeds: 1-2 servings a day Whole grains: 6-8 servings a day Extra-virgin olive oil: 3-4 servings a day Limit red meat and sweets to only a few servings a month What are my food choices? Mediterranean diet Recommended Grains: Whole-grain pasta. Brown rice. Bulgar wheat. Polenta. Couscous. Whole-wheat bread. [place]. Vegetables: Artichokes. Beets. Broccoli. Cabbage. Carrots. Eggplant. Green beans. Chard. Kale. Spinach. Onions. Leeks. Peas. Squash. Tomatoes. Peppers. Radishes. Fruits: Apples. Apricots. Avocado. Berries. Bananas. Cherries. Dates. Figs. Grapes. Lemons. Melon. Oranges. Peaches. Plums. Pomegranate. Meats and other protein foods: Beans. Almonds. Sunflower seeds. Pine nuts. Peanuts. [place]. Salmon. Scallops. Shrimp. [place]. Tilapia. Clams. Oysters.  Eggs. Dairy: Low-fat milk. Cheese. Greek yogurt. Beverages: Water. Red wine. Herbal tea. Fats and oils: Extra virgin olive oil. Avocado oil. Grape seed oil. Sweets and desserts: [place] yogurt with honey. Baked apples. Poached pears. Trail mix. Seasoning and other foods: Basil. Cilantro. Coriander. Cumin. Mint. Parsley. Sage. Rosemary. Tarragon. Garlic. Oregano. Thyme. Pepper. Balsalmic vinegar. Tahini. Hummus. Tomato sauce. Olives. Mushrooms. Limit these Grains: Prepackaged pasta or rice dishes. Prepackaged cereal with added sugar. Vegetables: Deep fried potatoes (french fries). Fruits: Fruit canned in syrup. Meats and other protein foods: Beef. Pork. Lamb. Poultry with skin. Hot dogs. [place] Salines. Dairy: Ice cream. Sour cream. Whole milk. Beverages: Juice. Sugar-sweetened soft drinks. Beer. Liquor and spirits. Fats and oils: Butter. Canola oil. Vegetable oil. Beef fat (tallow). Lard. Sweets and desserts: Cookies. Cakes. Pies. Candy. Seasoning and other foods: Mayonnaise. Premade sauces and marinades. The items listed may not be a complete list. Talk with your dietitian about what dietary choices are right for you. Summary The Mediterranean diet includes both food and lifestyle choices. Eat a variety of fresh fruits and vegetables, beans, nuts, seeds, and whole grains. Limit the amount of red meat and sweets  that you eat. Talk with your health care provider about whether it is safe for you to drink red wine in moderation. This means 1 glass a day for nonpregnant women and 2 glasses a day for men. A glass of wine equals 5 oz (150 mL). This information is not intended to replace advice given to you by your health care provider. Make sure you discuss any questions you have with your health care provider. Document Released: 07/04/2016 Document Revised: 08/06/2016 Document Reviewed: 07/04/2016 Elsevier Interactive Patient Education  2017 [employer].   We have sent a referral to [facility] for  your MRI and they will call you directly to schedule your appointment. They are located at [place]. If you need to contact them directly please call [phone].

## 2023-01-21 NOTE — Progress Notes (Signed)
Assessment/Plan:   Memory Impairment   Adrian Lewis is a very pleasant 77 y.o. 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  Neuropsych evaluation yielded a diagnosis of mild cognitive impairment of unclear etiology.  From the cognitive standpoint, the patient is stable.   Recommendations:   Follow up in 6 months. Discontinue alcohol intake Repeat neurocognitive testing in 12 to 18 months Recommend the use of CPAP for OSA Recommend hearing evaluation for hearing aids No indication for antidementia medication at this time  Paresthesias  Pertinent labs included mildly elevated IgA at 451, B1 214, B6 35.4 ANA positive, ENA SSB (LA) antibody 1.3, with normal B12, mildly positive Sjogrens, ENA, SPEP.  Patient wishes a referral to pain clinic, for the treatment of bilateral foot pain.  We discussed starting on gabapentin teen at the low-dose 100 mg for possible relief of his burning pain.  He agreed to try.  Of note, a referral to rheumatology for Sjogrens has been discussed, and he is very interested.  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  This study showed absent peroneal responses otherwise the rest of the responses were normal  Recommendations Referral to rheumatology for abnormal lab (Sjrogren's, in view of neuropathy dry mouth and eyes )  Referral to pain clinic In the interim will try gabapentin  100 mg at night for 1 week, then increase to twice daily for 1 week and then increase to 3 times a day if necessary, patient will determine the frequency pending on the therapeutic effect of the drug.  Side effects were discussed Discontinue B1 and B6, as disease supratherapeutic Discontinue K2 Discontinue alcohol    Subjective:   This patient is accompanied in the office by his wife  who supplements the history. Previous records as well as any outside records available were reviewed prior to todays visit.   Patient was last seen on 12/03/22    Any changes in memory since last visit? Denies any changes in his cognitive status. repeats oneself?  Endorsed by wife  Disoriented when walking into a room?  Patient denies  Leaving objects in unusual places?  Patient denies   Wandering behavior?   denies   Any personality changes since last visit?   denies   Any worsening depression?:  The pain in his feet, describes as burning, is affecting his mood.  Patient wishes a referral to the pain clinic.   Hallucinations or paranoia?  denies   Seizures?   denies    Any sleep changes? Sleeps 11-8 a.m. denies vivid dreams, REM behavior or sleepwalking   Sleep apnea?   denies   Any hygiene concerns?   denies   Independent of bathing and dressing?  Endorsed  Does the patient needs help with medications?  Patient is in charge  Who is in charge of the finances?   Him and his wife are in charge    Any changes in appetite?  denies.  He reports very dry mouth, persistent, worse over the last 1 year. Occasionally dry eyes and uses drops Patient have  trouble swallowing?  denies   Does the patient cook?  Any kitchen accidents such as leaving the stove on?   denies   Any headaches?    denies   Vision changes? denies Chronic back pain  denies   Ambulates with difficulty?    denies   Recent falls or head injuries?    denies     Unilateral weakness, numbness or tingling?   denies   Any tremors?  denies   Any  anosmia?    denies   Any incontinence of urine? Occasional  dribbling  Any bowel dysfunction?  Not frequently  does the patient drive?  Very short distances, due to history of macular degeneration Alcohol?  Patient is reducing his alcohol intake to 2 beers a night, "I have no plans of decreasing it any further ".  Neuropsychological testing 01/01/2023  Briefly, results suggested performance variability across processing speed and executive functioning. More subtle variability was exhibited across verbal fluency and visuospatial abilities; however, these performances remained adequate relative to age-matched peers. While he exhibited a weakness learning an unstructured list of words, his retention rate was strong and performances across all other memory tasks were consistently appropriate. The etiology for ongoing dysfunction is unclear at the present time. Given Dr. Thomasenia Sales high level of educational attainment and premorbid intellectual estimations, there remains the potential that performances in the average normative range could represent subtle decline from a previously higher degree of functioning. However, there is no prior testing to confirm this. Functional concerns raised by his wife and his very strong propensity for repetition without appreciation in conversation could be worrisome for the extremely early beginnings of a neurodegenerative illness. However, memory performances across testing were largely appropriate with age-matched peers and there are no current patterns across testing that would signal the need for more advanced concern at the present time. As the potential for gradual progression over time remains, continued monitoring will be important moving forward.   Initial evaluation for paresthesias on 12/03/2022    HPI: This is [name], a 77 y.o. 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 The patient is accompanied by his wife who provides additional information.. The patient has not had similar episodes of symptoms in the past.  She reports that about 6 weeks ago, he developed a burning sensation on the right foot first, and then about 2 weeks ago the same sensation was present on the left foot.  He is used to hiking, he states that it is difficult to hike or to walk, it is a burning pain of 8-9 in quality, "like electricity, burning, uncomfortable ".  He relies on walking on his heels avoiding the toes because they burn.  He is doing physical therapy, and relies on multiple natural medical products over-the-counter.  "The only thing that helps is a ball under the foot and rolling it back-and-forth ".  I cannot tie my shoes too tight or is uncomfortable ""I tried creams and did not help ".   Muscle bulk loss?  Denies  Muscle pain?  Denies Cramps/Twitching?  denies  Suggestion of myotonia/difficulty relaxing after contraction?  Denies Fatigable weakness?  Denies  Does strength improve after brief exercise?  "Curling my toes and stretching and massage helps he does "Massages at [facility] in [place] ", he is interested in light therapy  Can you arise from squatted position easily? Yes   Able to get out of chair without using arms? Yes   Able to walk up steps easily?  Yes Use an assistive device to walk? No  Significant imbalance with walking? Denies   Falls?denies  Any change in urine color, especially after  exertion/physical activity?  Denies.  He is able to urinate normally The patient denies  diplopia, ptosis.  Dysphagia?  Has trouble, needs to sit forward to eat. He denies poor saliva control, dysarthria/dysphonia, impaired mastication, facial weakness/droop. There are no neuromuscular respiratory weakness symptoms, particularly orthopnea>dyspnea.  The patient has not  noticed any recent skin rashes nor does he denies report any constitutional symptoms like fever, night sweats, anorexia or unintentional weight loss. ETOH use: 14 alcoholic drinks a week   Restrictive diet? Denies . Eating soft foods after tooth extractions last week Family history of neuropathy/myopathy/NM disease?  Denies   Current medications being tried for the patient's symptoms include no oral medications, he only tried creams like Capsicin and Aspercreme which did not help;  he is adamant to not use any prescribed medications, tries to rely on natural products, but according to his wife "he goes to the [employer] store, buys over-the-counter supplement and he does not remember what the supplement is for ".  He denies new prescribed medications for at least 6 months.  He also uses ice to numb it.  "Is helping temporarily ".  Initial evaluation for memory 05/07/2022 Adrian Lewis is a very pleasant 77 y.o. RH male  seen today in follow up for memory loss. This patient is accompanied in the office by his wife who supplements the history.  Previous records as well as any outside records available were reviewed prior to todays visit.  Patient was last seen at our office on 03/27/22 at which time his MoCA was 25/30  Patient is currently on      Any changes in memory since last visit?  Patient denies any worsening of his symptoms.  Main difficulty is short-term memory, such as learning new names.  He continues to read books, playing the violin, and watching documentaries about [place] culture for example. Patient lives with: Spouse  who noticed changes as well.  repeats oneself? Wife reports that he has been doing that for a long time. Disoriented when walking into a room?  Patient denies   Leaving objects in unusual places?  No changes, he still has to retrace himself to find the objects that he has left behind such as a cell phone. Ambulates  with difficulty?   He still has some balance issues, but denies any recent falls.  [facility], does gardening. Recent falls?  Patient denies   Any head injuries?  Patient denies   History of seizures?   Patient denies   Wandering behavior?  Patient denies   Patient drives?  He drives very short distances, he is not that interested in driving, especially since his vision is not good due to macular degeneration, and his reaction time is lower, he feels that they roads are more dangerous now. Any mood changes?  The patient has a  history of bipolar disorder, well controlled by psychiatry.  He is currently on ashwagandha, lithium, Lamictal and Wellbutrin.  Lithium 1125 mg p.o. daily Any depression?:  He does not feel depressed currently. Hallucinations?  Patient denies   Paranoia?  Patient denies   Patient reports that he sleeps with some vivid dreams, denies REM behavior or sleepwalking.  He has some issues with falling asleep, but then he stays asleep. History of sleep apnea?  Endorsed.  Using his CPAP  Any hygiene concerns?  Patient denies   Independent of bathing and dressing?  Endorsed  Does the patient needs help with medications?  Patient is in charge of his medications. Who is in charge of the finances?  He and his wife do the finances together.   Any changes in appetite?  Patient denies.  He does admit to not drinking enough water. Patient have trouble swallowing? Patient denies   Does the patient cook?  Patient denies   Any kitchen accidents such as leaving the stove on? Patient denies   Any headaches?  Patient denies   double vision? He has a history of  double vision corrected with glasses, and history of macular degeneration  Any focal numbness or tingling?  Patient denies   Chronic back pain Patient denies   Unilateral weakness?  Patient denies   Any tremors?  Patient denies   Any history of anosmia?  Patient denies   Any incontinence of urine?  Patient denies   Any bowel dysfunction?   Patient denies    MRI brain  5/2023No evidence of acute intracranial abnormality. Minimal chronic small-vessel ischemic changes within the cerebral white matter, stable from the prior brain MRI of 06/11/2017.  Mild generalized cerebral atrophy. 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 Consider ENT consultation.     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   Elevated hemoglobin A1c 08/07/2021   Excessive drinking of alcohol 01/01/2023   01/01/23 - reported 4 hard ciders (4.5% ABV) nightly   Generalized anxiety disorder 06/16/2017   GERD (gastroesophageal reflux disease)    History of hernia repair 05/14/2012   History of kidney stones    Hyperlipidemia, mixed 12/19/2020   Hyponatremia 10/08/2013   Hypothyroidism    Insomnia due to medical condition 06/16/2017   Macular degeneration    Major depressive disorder    Medication overdose 06/16/2017   Mild cognitive impairment of uncertain or unknown etiology 01/01/2023   Nephrolithiasis 01/25/2020   Obstructive sleep apnea 10/07/2013   Osteopenia 04/04/2014   Paresthesia of both feet 12/03/2022   Radiculopathy 02/24/2013   Rising PSA level 08/07/2021   Shortness of breath on exertion 12/19/2020   Tremor 03/19/2022      Past Surgical History:  Procedure Laterality Date   CATARACT EXTRACTION W/ INTRAOCULAR LENS IMPLANT  2011 (APPROX)   RIGHT EYE AND REPAIR DETACHED RETINA   CYSTOSCOPY WITH RETROGRADE PYELOGRAM, URETEROSCOPY AND STENT PLACEMENT Bilateral 02/23/2021   Procedure: CYSTOSCOPY WITH RETROGRADE PYELOGRAM, URETEROSCOPY AND STENT PLACEMENT;  Surgeon: Alexis Frock, MD;  Location: [facility];  Service: Urology;  Laterality: Bilateral;  75 MINS   EYE SURGERY     HAMMER TOE SURGERY  2012  &  2010  (APPROX)   ONE TOE , EACH FOOT   HERNIA REPAIR Bilateral    Inguinal Hernia Repair   HOLMIUM LASER APPLICATION Bilateral [id number]   Procedure: HOLMIUM LASER APPLICATION;  Surgeon: Alexis Frock, MD;  Location: [facility];  Service: Urology;  Laterality: Bilateral;   JOINT REPLACEMENT Left 2014   Partial Knee Replacement   KNEE SURGERY Left    x 2  partial and reconstructive   NASAL TURBINATE REDUCTION Bilateral 09/27/2015   Procedure: TURBINATE REDUCTION/SUBMUCOSAL RESECTION;  Surgeon: Clyde Canterbury, MD;  Location: [facility];  Service: ENT;  Laterality: Bilateral;   REPAIR RIGHT INGUINAL HERNIA W/ MESH  02-14-2006   SEPTOPLASTY N/A 09/27/2015   Procedure: SEPTOPLASTY;  Surgeon: Clyde Canterbury, MD;  Location: [facility];  Service: ENT;  Laterality: N/A;   URETEROSCOPY  08/12/2012   Procedure: URETEROSCOPY;  Surgeon: Alexis Frock, MD;  Location: [facility];  Service: Urology;  Laterality: Left;  1 HR      PREVIOUS MEDICATIONS:   CURRENT MEDICATIONS:  Outpatient Encounter Medications as of 01/21/2023  Medication Sig   Ascorbic Acid (VITAMIN C) 1000 MG tablet Take 1,000 mg by mouth daily.   b complex vitamins capsule Take 1 capsule by mouth daily.   buPROPion (WELLBUTRIN XL) 300 MG 24 hr tablet Take 1 tablet (300 mg total) by mouth daily. Stop Wellbutrin SR 200 MG   Coenzyme Q10 (COQ10 PO) Take 60 mg by mouth daily.   ergocalciferol (VITAMIN D2) 1.25 MG (50000 UT) capsule Take 50,000 Units by mouth  2 (two) times a week.   eszopiclone (LUNESTA) 2 MG TABS tablet Take 2 mg by mouth at bedtime as needed for sleep. Take immediately before bedtime   gabapentin (NEURONTIN) 100 MG capsule Start one tab at night for 1 week, then increase to 1 tab 2 times a day, and may increase after 1 week to three times a day   lamoTRIgine (LAMICTAL) 100 MG tablet TAKE 1 TABLET BY MOUTH DAILY   levothyroxine (SYNTHROID, LEVOTHROID) 25 MCG tablet Take 25 mcg by mouth Daily.    lithium carbonate (ESKALITH) 450 MG ER tablet Take 1 tablet (450 mg total) by mouth 2 (two) times daily. Dose change   magnesium oxide (MAG-OX) 400 MG tablet Take by mouth.   Menaquinone-7 (VITAMIN K2) 100 MCG CAPS Take 1 capsule by mouth daily.   Misc Natural Products (PROSTATE HEALTH PO) Take 1 capsule by mouth daily.   Multiple Vitamins-Minerals (OCUVITE PRESERVISION PO) Take 1 tablet by mouth daily.   Omega-3 Fatty Acids (FISH OIL) 1000 MG CAPS Take 1,000 mg by mouth daily.   OVER THE COUNTER MEDICATION Take 1 capsule by mouth daily. Adrenogen   potassium citrate (UROCIT-K) 10 MEQ (1080 MG) SR tablet 1 tablet with meals   Probiotic Product (PROBIOTIC DAILY PO) Take 1 capsule by mouth daily.   Quercetin 500 MG CAPS Take 1 capsule by mouth daily.   RESVERATROL PO Take 500 mg by mouth daily.   S-Adenosylmethionine (SAME) 400 MG TABS Take 400 mg by mouth daily.   senna-docusate (SENOKOT-S) 8.6-50 MG tablet Take 1 tablet by mouth 2 (two) times daily. While taking strongest pain meds to prevent constipation.   sodium fluoride (FLUORISHIELD) 1.1 % GEL dental gel Place onto teeth.   telmisartan (MICARDIS) 20 MG tablet Take 1 tablet by mouth daily.   Tryptophan 500 MG CAPS Take 1 capsule by mouth at bedtime.   No facility-administered encounter medications on file as of  01/21/2023.     Objective:     PHYSICAL EXAMINATION:    VITALS:   Vitals:   01/21/23 1117  BP: (!) 158/77  Pulse: 74  Resp: 20  SpO2: 95%  Weight: 175 lb (79.4 kg)   Height: '5\' 7"'$  (1.702 m)    GEN:  The patient appears stated age and is in NAD. HEENT:  Normocephalic, atraumatic.   Neurological examination:  General: NAD, well-groomed, appears stated age. Orientation: The patient is alert. Oriented to person, place and date Cranial nerves: There is good facial symmetry. Anxious appearingThe speech is fluent and clear. No aphasia or dysarthria. Fund of knowledge is appropriate. Recent memory impaired and remote memory is normal.  Attention and concentration are normal.  Able to name objects and repeat phrases.  Hearing is intact to conversational tone.     Sensation: Sensation is intact to light touch throughout Motor: Strength is at least antigravity x4. Tremors: none  DTR's 2/4 in UE/LE      03/27/2022   10:00 AM  Montreal Cognitive Assessment   Visuospatial/ Executive (0/5) 2  Naming (0/3) 3  Attention: Read list of digits (0/2) 2  Attention: Read list of letters (0/1) 1  Attention: Serial 7 subtraction starting at 100 (0/3) 3  Language: Repeat phrase (0/2) 2  Language : Fluency (0/1) 1  Abstraction (0/2) 2  Delayed Recall (0/5) 3  Orientation (0/6) 6  Total 25  Adjusted Score (based on education) 25        No data to display             Movement examination: Tone: There is normal tone in the UE/LE Abnormal movements:  no tremor.  No myoclonus.  No asterixis.   Coordination:  There is no decremation with RAM's. Normal finger to nose  Gait and Station: The patient has no difficulty arising out of a deep-seated chair without the use of the hands. The patient's stride length is good.  Gait is cautious and narrow.   Thank you for allowing [place] the opportunity to participate in the care of this nice patient. Please do not hesitate to contact us for any questions or concerns.   Total time spent on today's visit was 50 minutes dedicated to this patient today, preparing to see patient, examining the patient, ordering tests and/or  medications and counseling the patient, documenting clinical information in the EHR or other health record, independently interpreting results and communicating results to the patient/family, discussing treatment and goals, answering patient's questions and coordinating care.  Cc:  Juluis Pitch, MD  Sharene Butters 01/21/2023 12:54 PM

## 2023-02-07 ENCOUNTER — Other Ambulatory Visit: Payer: Self-pay | Admitting: Psychiatry

## 2023-02-07 DIAGNOSIS — G4701 Insomnia due to medical condition: Secondary | ICD-10-CM

## 2023-02-11 ENCOUNTER — Telehealth: Payer: Self-pay

## 2023-02-11 NOTE — Telephone Encounter (Signed)
Last visit in January -patient reported he was taking the Lunesta 2 mg.  If he wants to be back on zaleplon or Sonata, please contact pharmacy to cancel the [facility] prescription sent over so I can send a new prescription for zaleplon.

## 2023-02-11 NOTE — Telephone Encounter (Signed)
pt called states that he needs his refill on ths sleep medication. he states he on the sonata zaleplon pt was last seen on 11-26-22 and next appt 04-01-23

## 2023-02-12 NOTE — Telephone Encounter (Signed)
left message asking him to call for clarification of medication that he is taking. per dr. Shea Evans he stated that he was taking the lunesta.

## 2023-02-13 ENCOUNTER — Telehealth: Payer: Self-pay

## 2023-02-13 DIAGNOSIS — G4701 Insomnia due to medical condition: Secondary | ICD-10-CM

## 2023-02-13 MED ORDER — ZALEPLON 10 MG PO CAPS: 10.0000 mg | ORAL_CAPSULE | Freq: Every evening | ORAL | 2 refills | Status: DC | PRN

## 2023-02-13 NOTE — Telephone Encounter (Signed)
Informed patient that his Rx had been sent to the [facility]

## 2023-02-13 NOTE — Telephone Encounter (Signed)
I have sent zaleplon 10 mg to pharmacy at [facility].

## 2023-02-13 NOTE — Telephone Encounter (Signed)
Anderson Malta the wife of the patient called to report that the patient is taking [employer] called to get the dosage that he is taking she will call back with that info.

## 2023-02-13 NOTE — Telephone Encounter (Signed)
Patient called back to report that he is taking Sonata 10 mg and would like for the Rx to be sent to the [facility] which is list in his chart

## 2023-03-12 ENCOUNTER — Telehealth: Payer: Self-pay

## 2023-03-12 DIAGNOSIS — F3176 Bipolar disorder, in full remission, most recent episode depressed: Secondary | ICD-10-CM

## 2023-03-12 DIAGNOSIS — Z79899 Other long term (current) drug therapy: Secondary | ICD-10-CM

## 2023-03-12 NOTE — Telephone Encounter (Signed)
pt called left a message that he needs a standing order for his labwork for lithium. he states he suppose to have it done montly and it has not been done in the last 3 months. he going to the [facility] on [address]. please let me know when you have order done so I can contact patient.

## 2023-03-12 NOTE — Telephone Encounter (Signed)
Please let patient know, lithium level-standing order for once a month has been ordered.  An order for BUN/creatinine for once every 3 months also has been ordered-standing order.  They should have it in the system for [facility]  Please also print out lab order and fax it to [facility] of choice if patient wants it faxed as well.

## 2023-03-14 NOTE — Telephone Encounter (Signed)
went online to the [facility] website and enter in the order for patient labwork.

## 2023-03-14 NOTE — Telephone Encounter (Signed)
told that a copy of the labwork orders will be mailed out. and that i would contact [facility] also.

## 2023-03-26 ENCOUNTER — Encounter: Payer: Self-pay | Admitting: Podiatry

## 2023-03-26 ENCOUNTER — Ambulatory Visit (INDEPENDENT_AMBULATORY_CARE_PROVIDER_SITE_OTHER): Payer: Medicare Other | Admitting: Podiatry

## 2023-03-26 DIAGNOSIS — M778 Other enthesopathies, not elsewhere classified: Secondary | ICD-10-CM

## 2023-03-26 MED ORDER — TRIAMCINOLONE ACETONIDE 40 MG/ML IJ SUSP
40.0000 mg | Freq: Once | INTRAMUSCULAR | Status: AC
  Administered 2023-03-26: 40 mg

## 2023-03-26 NOTE — Progress Notes (Signed)
He presents today for follow-up of his capsulitis and neuropathy bilaterally he states that the neuropathy is doing much better with his treatments that he is going through at this point he still having a lot of pain with the arthritis across the top of the foot and through the middle aspect of the bottom of the foot.  Objective: Vital signs are stable he is alert oriented x 3 pulses are palpable.  Severe osteoarthritic changes of the dorsal aspect of the bilateral foot left greater than right.  Reviewed nerve conduction velocity exam and EMG findings from Dr. Allena Katz.  This did relate significant radiculopathy as well as generalized neuropathy.  Assessment: Osteoarthritis capsulitis dorsal aspect bilateral foot.  Plan: Discussed etiology pathology conservative versus surgical therapies.  At this point I injected the dorsal aspect of the bilateral foot with 10 mg Kenalog 5 mg Marcaine.  I will follow-up with him in a few months.

## 2023-03-27 ENCOUNTER — Other Ambulatory Visit: Payer: Self-pay | Admitting: Otolaryngology

## 2023-03-27 DIAGNOSIS — J3802 Paralysis of vocal cords and larynx, bilateral: Secondary | ICD-10-CM

## 2023-04-01 ENCOUNTER — Encounter: Payer: Self-pay | Admitting: Psychiatry

## 2023-04-01 ENCOUNTER — Ambulatory Visit (INDEPENDENT_AMBULATORY_CARE_PROVIDER_SITE_OTHER): Payer: Medicare Other | Admitting: Psychiatry

## 2023-04-01 VITALS — BP 128/76 | HR 89 | Temp 98.1°F | Ht 67.0 in | Wt 173.0 lb

## 2023-04-01 DIAGNOSIS — G4701 Insomnia due to medical condition: Secondary | ICD-10-CM | POA: Diagnosis not present

## 2023-04-01 DIAGNOSIS — F3176 Bipolar disorder, in full remission, most recent episode depressed: Secondary | ICD-10-CM | POA: Diagnosis not present

## 2023-04-01 DIAGNOSIS — G3184 Mild cognitive impairment, so stated: Secondary | ICD-10-CM

## 2023-04-01 DIAGNOSIS — Z79899 Other long term (current) drug therapy: Secondary | ICD-10-CM

## 2023-04-01 MED ORDER — LAMOTRIGINE 100 MG PO TABS: 100.0000 mg | ORAL_TABLET | Freq: Every day | ORAL | 1 refills | Status: DC

## 2023-04-01 MED ORDER — BUPROPION HCL ER (XL) 300 MG PO TB24: 300.0000 mg | ORAL_TABLET | Freq: Every day | ORAL | 0 refills | Status: DC

## 2023-04-01 NOTE — Progress Notes (Signed)
BH MD OP Progress Note  04/01/2023 2:17 PM Adrian Lewis  MRN:  [id number]  Chief Complaint:  Chief Complaint  Patient presents with   Follow-up   Anxiety   Depression   Insomnia   Medication Refill   HPI: Adrian Lewis is a 77 year old Caucasian male, retired Programmer, multimedia, married, lives in [place], has a history of bipolar disorder type II, hypothyroidism, obstructive sleep apnea on CPAP, hypertension, primary osteoarthritis was evaluated in office today.  Patient today reports he continues to struggle with multiple medical problems including neuropathy pain of bilateral lower extremities, cardiac problems , essential hypertension, reports he has been referred to cardiology as well as pulmonology.  He is currently waiting the consultation.  Patient reports he is currently undergoing acupuncture therapy for his neuropathy pain.  That seems to be beneficial.  He tried the gabapentin however developed side effects and could not take it more than a day.  Patient reports overall mood symptoms are currently stable.  He does not feel depressed at this time.  However he worries about going into a depression episode in the future if he does not take a higher dosage of lithium.  He currently takes 900 mg of lithium daily.  Had recent lithium levels done on 03/13/2023-0.7-level is therapeutic.  However he continues to worry that he needs to go back on the higher doses that he used to take previously.  He also takes Lamictal and Wellbutrin.  Denies side effects.  Continues to struggle with CPAP problems.  Reports he has dry mouth and has to wake up in the middle of the night.  However he is able to fall back asleep.  Reports he does take the [facility] which helps with sleep.  Does have upcoming appointment with pulmonology for sleep consultation.  Looks forward to that.  Takes a nap during the day around 2 to 2-1/2 hours and that seems to help.  In spite of that he continues to struggle with a lot  of fatigue during the day.  Patient denies any suicidality, homicidality or perceptual disturbances.  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  That does keep him busy.  Continues to follow-up with his primary care provider and is on multiple medications for his elevated blood pressure and is on Micardis, reports dosage was recently increased.  He is aware of drug to drug interaction with lithium.  Agreeable to get lithium levels monitored once a month.  He prefers to go to [facility] [employer] [employer]  Patient denies any other concerns today.  Visit Diagnosis:    ICD-10-CM   1. Bipolar disorder, in full remission, most recent episode depressed (HCC)  F31.76 Lithium level    BUN+Creat    lamoTRIgine (LAMICTAL) 100 MG tablet   Type 2    2. Insomnia due to medical condition  G47.01 buPROPion (WELLBUTRIN XL) 300 MG 24 hr tablet   OSA WITH CPAP problem, Pain    3. Mild cognitive impairment  G31.84     4. High risk medication use  Z79.899 Lithium level    BUN+Creat      Past Psychiatric History: I have reviewed past psychiatric history from progress note on 01/21/2022.  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   Elevated hemoglobin A1c 08/07/2021   Excessive drinking of alcohol 01/01/2023   01/01/23 - reported 4 hard ciders (4.5% ABV) nightly   Generalized anxiety disorder 06/16/2017   GERD (gastroesophageal reflux disease)    History of hernia repair 05/14/2012   History of kidney stones    Hyperlipidemia, mixed 12/19/2020   Hyponatremia 10/08/2013   Hypothyroidism    Insomnia due to medical condition 06/16/2017   Macular  degeneration    Major depressive disorder    Medication overdose 06/16/2017   Mild cognitive impairment of uncertain or unknown etiology 01/01/2023   Nephrolithiasis 01/25/2020   Obstructive sleep apnea 10/07/2013   Osteopenia 04/04/2014   Paresthesia of both feet 12/03/2022   Radiculopathy 02/24/2013   Rising PSA level 08/07/2021   Shortness of breath on exertion 12/19/2020   Tremor 03/19/2022    Past Surgical History:  Procedure Laterality Date   CATARACT EXTRACTION W/ INTRAOCULAR LENS IMPLANT  2011 (APPROX)   RIGHT EYE AND REPAIR DETACHED RETINA   CYSTOSCOPY WITH RETROGRADE PYELOGRAM, URETEROSCOPY AND STENT PLACEMENT Bilateral 02/23/2021   Procedure: CYSTOSCOPY WITH RETROGRADE PYELOGRAM, URETEROSCOPY AND STENT PLACEMENT;  Surgeon: Sebastian Ache, MD;  Location: [facility];  Service: Urology;  Laterality: Bilateral;  75 MINS   EYE SURGERY     HAMMER TOE SURGERY  2012  &  2010  (APPROX)   ONE TOE , EACH FOOT   HERNIA REPAIR Bilateral    Inguinal Hernia Repair   HOLMIUM LASER APPLICATION Bilateral 02/23/2021   Procedure: HOLMIUM LASER APPLICATION;  Surgeon: Sebastian Ache, MD;  Location: [facility];  Service: Urology;  Laterality: Bilateral;   JOINT REPLACEMENT Left 2014   Partial Knee Replacement   KNEE SURGERY Left    x 2  partial and reconstructive   NASAL TURBINATE REDUCTION Bilateral 09/27/2015   Procedure: TURBINATE REDUCTION/SUBMUCOSAL RESECTION;  Surgeon: Geanie Logan, MD;  Location: [facility];  Service: ENT;  Laterality: Bilateral;   REPAIR RIGHT INGUINAL HERNIA W/ MESH  02-14-2006   SEPTOPLASTY N/A 09/27/2015   Procedure: SEPTOPLASTY;  Surgeon: Geanie Logan, MD;  Location: [facility];  Service: ENT;  Laterality: N/A;   URETEROSCOPY  08/12/2012   Procedure: URETEROSCOPY;  Surgeon: Sebastian Ache, MD;  Location: [facility];  Service: Urology;  Laterality: Left;  1 HR     Family Psychiatric History: I have reviewed family psychiatric history from progress note on  01/21/2022.  Family History:  Family History  Problem Relation Age of Onset   Dementia Mother        with advanced age; ~21s   Heart attack Father    Suicidality Sister    Suicidality Brother    Parkinson's disease Maternal Aunt     Social History: I have reviewed social history from progress note on 01/21/2022. Social History   Socioeconomic History   Marital status: Married    Spouse name: Not on file   Number of children: Not on file   Years of education: 20   Highest education level: Doctorate  Occupational History   Occupation: Retired    Comment: English professor  Tobacco Use   Smoking status: Never   Smokeless tobacco: Never  Vaping Use   Vaping Use: Never used  Substance and Sexual Activity   Alcohol use: Yes    Alcohol/week: 28.0 standard drinks of alcohol    Types: 28 Standard drinks or equivalent per week  Comment: 4 hard ciders nightly   Drug use: No   Sexual activity: Not Currently  Other Topics Concern   Not on file  Social History Narrative   Right handed   Drinks caffeine   Two story home   Lives with wife in the home   retired   Chief Executive Officer Determinants of Corporate investment banker Strain: Not on file  Food Insecurity: Not on file  Transportation Needs: Not on file  Physical Activity: Not on file  Stress: Not on file  Social Connections: Not on file    Allergies:  Allergies  Allergen Reactions   Bee Venom Anaphylaxis    Has EPI pen for this   Cat Hair Extract Other (See Comments)    Per pt his nose runs/congestion.    Clonidine Other (See Comments)    fatigue fatigue fatigue   Grass Pollen(K-O-R-T-Swt Vern) Other (See Comments)    Runny nose    Metabolic Disorder Labs: No results found for: "HGBA1C", "MPG" No results found for: "PROLACTIN" No results found for: "CHOL", "TRIG", "HDL", "CHOLHDL", "VLDL", "LDLCALC" Lab Results  Component Value Date   TSH 1.310 11/06/2022   TSH 1.030 03/04/2022    Therapeutic Level Labs: Lab  Results  Component Value Date   LITHIUM 0.8 11/06/2022   LITHIUM 0.7 10/11/2022   No results found for: "VALPROATE" No results found for: "CBMZ"  Current Medications: Current Outpatient Medications  Medication Sig Dispense Refill   Ascorbic Acid (VITAMIN C) 1000 MG tablet Take 1,000 mg by mouth daily.     b complex vitamins capsule Take 1 capsule by mouth daily.     Coenzyme Q10 (COQ10 PO) Take 60 mg by mouth daily.     ergocalciferol (VITAMIN D2) 1.25 MG (50000 UT) capsule Take 50,000 Units by mouth 2 (two) times a week.     eszopiclone (LUNESTA) 2 MG TABS tablet Take 2 mg by mouth at bedtime as needed for sleep. Take immediately before bedtime     levothyroxine (SYNTHROID, LEVOTHROID) 25 MCG tablet Take 25 mcg by mouth Daily.      lithium carbonate (ESKALITH) 450 MG ER tablet Take 1 tablet (450 mg total) by mouth 2 (two) times daily. Dose change 180 tablet 0   magnesium oxide (MAG-OX) 400 MG tablet Take by mouth.     Menaquinone-7 (VITAMIN K2) 100 MCG CAPS Take 1 capsule by mouth daily.     Misc Natural Products (PROSTATE HEALTH PO) Take 1 capsule by mouth daily.     Multiple Vitamins-Minerals (OCUVITE PRESERVISION PO) Take 1 tablet by mouth daily.     Omega-3 Fatty Acids (FISH OIL) 1000 MG CAPS Take 1,000 mg by mouth daily.     OVER THE COUNTER MEDICATION Take 1 capsule by mouth daily. Adrenogen     potassium citrate (UROCIT-K) 10 MEQ (1080 MG) SR tablet 1 tablet with meals     Probiotic Product (PROBIOTIC DAILY PO) Take 1 capsule by mouth daily.     Quercetin 500 MG CAPS Take 1 capsule by mouth daily.     RESVERATROL PO Take 500 mg by mouth daily.     S-Adenosylmethionine (SAME) 400 MG TABS Take 400 mg by mouth daily.     senna-docusate (SENOKOT-S) 8.6-50 MG tablet Take 1 tablet by mouth 2 (two) times daily. While taking strongest pain meds to prevent constipation. 10 tablet 0   sodium fluoride (FLUORISHIELD) 1.1 % GEL dental gel Place onto teeth.     telmisartan (MICARDIS) 20 MG  tablet  Take 40 mg by mouth daily.     Tryptophan 500 MG CAPS Take 1 capsule by mouth at bedtime.     zaleplon (SONATA) 10 MG capsule Take 1 capsule (10 mg total) by mouth at bedtime as needed for sleep. 30 capsule 2   buPROPion (WELLBUTRIN XL) 300 MG 24 hr tablet Take 1 tablet (300 mg total) by mouth daily. Stop Wellbutrin SR 200 MG 90 tablet 0   lamoTRIgine (LAMICTAL) 100 MG tablet Take 1 tablet (100 mg total) by mouth daily. 90 tablet 1   No current facility-administered medications for this visit.     Musculoskeletal: Strength & Muscle Tone: within normal limits Gait & Station:  slow Patient leans: Front  Psychiatric Specialty Exam: Review of Systems  Constitutional:  Positive for fatigue.  Psychiatric/Behavioral:  Positive for sleep disturbance. The patient is nervous/anxious.   All other systems reviewed and are negative.   Blood pressure 128/76, pulse 89, temperature 98.1 F (36.7 C), temperature source Skin, height 5\' 7"  (1.702 m), weight 173 lb (78.5 kg).Body mass index is 27.1 kg/m.  General Appearance: Casual  Eye Contact:  Fair  Speech:  Normal Rate  Volume:  Normal  Mood:  Anxious Coping well  Affect:  Appropriate  Thought Process:  Goal Directed and Descriptions of Associations: Intact  Orientation:  Full (Time, Place, and Person)  Thought Content: Logical   Suicidal Thoughts:  No  Homicidal Thoughts:  No  Memory:  Immediate;   Fair Recent;   Fair Remote;   Fair  Judgement:  Fair  Insight:  Fair  Psychomotor Activity:  Normal  Concentration:  Concentration: Fair and Attention Span: Fair  Recall:  [employer] of Knowledge: Fair  Language: Fair  Akathisia:  No  Handed:  Right  AIMS (if indicated): not done  Assets:  Communication Skills Desire for Improvement Housing Intimacy Social Support Transportation  ADL's:  Intact  Cognition: WNL  Sleep:   interrupted due to CPAP problem, overall OK   Screenings: GAD-7    Flowsheet Row Office Visit from  04/01/2023 in [facility]  Total GAD-7 Score 0      PHQ2-9    Flowsheet Row Office Visit from 04/01/2023 in [facility] Video Visit from 11/26/2022 in [facility] Video Visit from 10/10/2022 in [facility] Video Visit from 07/17/2022 in [facility] Office Visit from 01/21/2022 in [facility]  PHQ-2 Total Score 1 0 0 3 0  PHQ-9 Total Score 5 -- -- 9 --      Flowsheet Row Office Visit from 04/01/2023 in [facility] Video Visit from 11/26/2022 in [facility] Video Visit from 10/10/2022 in [facility]  C-SSRS RISK CATEGORY No Risk No Risk No Risk        Assessment and Plan: NIDAL MARCUS is a 77 year old Caucasian male who has a history of bipolar disorder type II, insomnia, cognitive disorder likely mild, presented for medication management.  Patient is currently stable with regards to his mood although continues to have CPAP problems, sleep issues at night and fatigue during the day, has upcoming appointment with pulmonology as well as cardiology.  Discussed plan as noted below.  Plan Bipolar disorder type II depressed in remission Wellbutrin XL 300 mg p.o. daily Lithium 450 mg p.o. twice daily Patient aware  of drug to drug interaction between Micardis and lithium.  Patient would like to stay on this dosage and is not interested in being weaned off of lithium.  As per patient lithium is the only medication that has helped him with his depression. Lamotrigine 100 mg p.o. daily.  Insomnia-improving Patient does have CPAP problem, has upcoming appointment with pulmonology. Lunesta 2 mg p.o. nightly Reviewed [place] PMP AWARxE Patient will  also need sufficient pain management.  Currently following up with acupuncturist for neuropathy pain.  Mild cognitive impairment-chronic MOCA- 03/27/2022- 25/30 Patient was referred for neurology consultation.  High risk medication use-reviewed labs 03/13/2023-lithium level-0.7-therapeutic. TSH, BUN and creatinine-within normal limits. Will repeat lithium level monthly basis.  Printed out 6 copies of lab slips for each month for both BUN/creatinine and lithium level-patient to go to [facility] once a month.  Patient advised to sign an ROI to coordinate care and get medical records from cardiology and pulmonology consultation.  Patient currently struggling with fatigue.  Follow-up in clinic in 3 months or sooner if needed.  Collaboration of Care: Collaboration of Care: Other patient did sign an ROI to obtain medical records from cardiology and pulmonology consultation.  Patient/Guardian was advised Release of Information must be obtained prior to any record release in order to collaborate their care with an outside provider. Patient/Guardian was advised if they have not already done so to contact the registration department to sign all necessary forms in order for [place] to release information regarding their care.   Consent: Patient/Guardian gives verbal consent for treatment and assignment of benefits for services provided during this visit. Patient/Guardian expressed understanding and agreed to proceed.   This note was generated in part or whole with voice recognition software. Voice recognition is usually quite accurate but there are transcription errors that can and very often do occur. I apologize for any typographical errors that were not detected and corrected.    Jomarie Longs, MD 04/01/2023, 2:17 PM

## 2023-04-02 ENCOUNTER — Encounter: Payer: Self-pay | Admitting: Internal Medicine

## 2023-04-02 ENCOUNTER — Ambulatory Visit (INDEPENDENT_AMBULATORY_CARE_PROVIDER_SITE_OTHER): Payer: Medicare Other | Admitting: Internal Medicine

## 2023-04-02 VITALS — BP 144/82 | HR 88 | Temp 97.8°F | Ht 67.0 in | Wt 173.0 lb

## 2023-04-02 DIAGNOSIS — G4733 Obstructive sleep apnea (adult) (pediatric): Secondary | ICD-10-CM

## 2023-04-02 NOTE — Progress Notes (Signed)
Name: Adrian Lewis MRN: [id number] DOB: [birth year]    CHIEF COMPLAINT:  EXCESSIVE DAYTIME SLEEPINESS   HISTORY OF PRESENT ILLNESS: Patient is seen today for problems and issues with sleep related to excessive daytime sleepiness Patient  has been having sleep problems for many years Patient has been having excessive daytime sleepiness for a long time Patient has been having extreme fatigue and tiredness, lack of energy +  very Loud snoring every night + struggling breathe at night and gasps for air + morning headaches + Nonrefreshing sleep Patient has diagnosis of moderate sleep apnea AHI of 22 CPAP download from January 2024 reports 100% compliance with AHI of 1.2  Current download shows excellent compliance Patient has 100% for days 100% for greater than 4 hours with AHI reduced to 0.9 Patient uses and benefits from therapy Advised to use it during his nap time as well  Encouraged proper weight management.  Discussed driving precautions and its relationship with hypersomnolence.  Discussed operating dangerous equipment and its relationship with hypersomnolence.  Discussed sleep hygiene, and benefits of a fixed sleep waked time.  The importance of getting eight or more hours of sleep discussed with patient.  Discussed limiting the use of the computer and television before bedtime.  Decrease naps during the day, so night time sleep will become enhanced.  Limit caffeine, and sleep deprivation.  HTN, stroke, and heart failure are potential risk factors.    EPWORTH SLEEP SCORE 15   No evidence of heart failure at this time No evidence or signs of infection at this time No respiratory distress No fevers, chills, nausea, vomiting, diarrhea No evidence of lower extremity edema No evidence hemoptysis    PAST MEDICAL HISTORY :   has a past medical history of Abnormal ECG (12/19/2020), Arthritis, Benign essential hypertension (12/19/2020), Bilateral sciatica (10/07/2013),  Bipolar disorder II, in full remission, most recent episode depressed (01/21/2022), Circadian rhythm sleep disorder, 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), Elevated hemoglobin A1c (08/07/2021), Excessive drinking of alcohol (01/01/2023), Generalized anxiety disorder (06/16/2017), GERD (gastroesophageal reflux disease), History of hernia repair (05/14/2012), History of kidney stones, Hyperlipidemia, mixed (12/19/2020), Hyponatremia (10/08/2013), Hypothyroidism, 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  has a past surgical history that includes REPAIR RIGHT INGUINAL HERNIA W/ MESH (02-14-2006); Hammer toe surgery (2012  &  2010  (APPROX)); Cataract extraction w/ intraocular lens implant (2011 (APPROX)); Ureteroscopy (08/12/2012); Knee surgery (Left); Eye surgery; Hernia repair (Bilateral); Joint replacement (Left, 2014); Septoplasty (N/A, 09/27/2015); Nasal turbinate reduction (Bilateral, 09/27/2015); Cystoscopy with retrograde pyelogram, ureteroscopy and stent placement (Bilateral, 02/23/2021); and Holmium laser application (Bilateral, 02/23/2021). Prior to Admission medications   Medication Sig Start Date End Date Taking? Authorizing Provider  Ascorbic Acid (VITAMIN C) 1000 MG tablet Take 1,000 mg by mouth daily.    [provider]  b complex vitamins capsule Take 1 capsule by mouth daily.    [provider]  buPROPion (WELLBUTRIN XL) 300 MG 24 hr tablet Take 1 tablet (300 mg total) by mouth daily. Stop Wellbutrin SR 200 MG 04/01/23   Jomarie Longs, MD  Coenzyme Q10 (COQ10 PO) Take  60 mg by mouth daily.    [provider]  ergocalciferol (VITAMIN D2) 1.25 MG (50000 UT) capsule Take 50,000 Units  by mouth 2 (two) times a week.    [provider]  eszopiclone (LUNESTA) 2 MG TABS tablet Take 2 mg by mouth at bedtime as needed for sleep. Take immediately before bedtime    [provider]  lamoTRIgine (LAMICTAL) 100 MG tablet Take 1 tablet (100 mg total) by mouth daily. 04/01/23   Jomarie Longs, MD  levothyroxine (SYNTHROID, LEVOTHROID) 25 MCG tablet Take 25 mcg by mouth Daily.  03/19/12   [provider]  lithium carbonate (ESKALITH) 450 MG ER tablet Take 1 tablet (450 mg total) by mouth 2 (two) times daily. Dose change 11/26/22   Jomarie Longs, MD  magnesium oxide (MAG-OX) 400 MG tablet Take by mouth.    [provider]  Menaquinone-7 (VITAMIN K2) 100 MCG CAPS Take 1 capsule by mouth daily.    [provider]  Misc Natural Products (PROSTATE HEALTH PO) Take 1 capsule by mouth daily.    [provider]  Multiple Vitamins-Minerals (OCUVITE PRESERVISION PO) Take 1 tablet by mouth daily.    [provider]  Omega-3 Fatty Acids (FISH OIL) 1000 MG CAPS Take 1,000 mg by mouth daily.    [provider]  OVER THE COUNTER MEDICATION Take 1 capsule by mouth daily. Adrenogen    [provider]  potassium citrate (UROCIT-K) 10 MEQ (1080 MG) SR tablet 1 tablet with meals 12/23/12   [provider]  Probiotic Product (PROBIOTIC DAILY PO) Take 1 capsule by mouth daily.    [provider]  Quercetin 500 MG CAPS Take 1 capsule by mouth daily.    [provider]  RESVERATROL PO Take 500 mg by mouth daily.    [provider]  S-Adenosylmethionine (SAME) 400 MG TABS Take 400 mg by mouth daily.    [provider]  senna-docusate (SENOKOT-S) 8.6-50 MG tablet Take 1 tablet by mouth 2 (two) times daily. While taking strongest pain meds to prevent constipation. 02/23/21    Loletta Parish., MD  sodium fluoride (FLUORISHIELD) 1.1 % GEL dental gel Place onto teeth. 02/24/19   [provider]  telmisartan (MICARDIS) 20 MG tablet Take 40 mg by mouth daily. 10/15/22   [provider]  Tryptophan 500 MG CAPS Take 1 capsule by mouth at bedtime.    [provider]  zaleplon (SONATA) 10 MG capsule Take 1 capsule (10 mg total) by mouth at bedtime as needed for sleep. 02/13/23 05/14/23  Jomarie Longs, MD   Allergies  Allergen Reactions   Bee Venom Anaphylaxis    Has EPI pen for this   Cat Hair Extract Other (See Comments)    Per pt his nose runs/congestion.    Clonidine Other (See Comments)    fatigue fatigue fatigue   Grass Pollen(K-O-R-T-Swt Vern) Other (See Comments)    Runny nose    FAMILY HISTORY:  family history includes Dementia in his mother; Heart attack in his father; Parkinson's disease in his maternal aunt; Suicidality in his brother and sister. SOCIAL HISTORY:  reports that he has never smoked. He has never used smokeless tobacco. He reports current alcohol use of about 28.0 standard drinks of alcohol per week. He reports that he does not use drugs.     BP (!) 144/82 (BP Location: Right Arm, Cuff Size: Normal)   Pulse 88   Temp 97.8 F (36.6 C) (Temporal)   Ht 5\' 7"  (1.702 m)   Wt 173 lb (78.5 kg)   SpO2 98%   BMI 27.10 kg/m  Review of Systems: Gen:  Denies  fever, sweats, chills weight loss  HEENT: Denies blurred vision, double vision, ear pain, eye pain, hearing loss, nose bleeds, sore throat Cardiac:  No dizziness, chest pain or heaviness, chest tightness,edema, No JVD Resp:   No cough, -sputum production, -shortness of breath,-wheezing, -hemoptysis,  Other:  All other systems negative   Physical Examination:   General Appearance: No distress  EYES PERRLA, EOM intact.   NECK Supple, No JVD Pulmonary: normal breath sounds, No wheezing.  CardiovascularNormal S1,S2.  No m/r/g.   Abdomen:  Benign, Soft, non-tender. Neurology UE/LE 5/5 strength, no focal deficits Ext pulses intact, cap refill intact ALL OTHER ROS ARE NEGATIVE     ASSESSMENT AND PLAN SYNOPSIS  77 year old male seen today for assessment for underlying sleep apnea with AHI of 22 Previous compliance report reviewed in detail from [facility] physicians showing excellent compliance with AHI reduced to 1.2  MODERATE OSA Currently on V Auto 6-25cm h20 with 4cm h20 pressure support Full face mask Patient uses and benefits from therapy   Deconditioned state -Recommend increased daily activity and exercise   MEDICATION ADJUSTMENTS/LABS AND TESTS ORDERED: Continue CPAP as prescribed     CURRENT MEDICATIONS REVIEWED AT LENGTH WITH PATIENT TODAY   Patient  satisfied with Plan of action and management. All questions answered  Follow up  3 months  Total Time Spent  35 mins   Wallis Bamberg Santiago Glad, M.D.  [facility]  Medical Director [facility] Medical Director [facility] Cardio-Pulmonary Department

## 2023-04-02 NOTE — Patient Instructions (Addendum)
Continue Therapy for OSA   EXCELLENT JOB A+!!! Keep up  the great work!!  Follow up ENT recommendations

## 2023-04-08 ENCOUNTER — Ambulatory Visit
Admission: RE | Admit: 2023-04-08 | Discharge: 2023-04-08 | Disposition: A | Discharge status: Home / Self Care | Payer: Medicare Other | Source: Ambulatory Visit | Attending: Otolaryngology | Admitting: Otolaryngology

## 2023-04-08 DIAGNOSIS — J3802 Paralysis of vocal cords and larynx, bilateral: Secondary | ICD-10-CM

## 2023-04-08 MED ORDER — IOHEXOL 300 MG/ML  SOLN
100.0000 mL | Freq: Once | INTRAMUSCULAR | Status: AC | PRN
  Administered 2023-04-08: 80 mL via INTRAVENOUS

## 2023-04-11 ENCOUNTER — Other Ambulatory Visit: Payer: Self-pay | Admitting: Psychiatry

## 2023-04-12 LAB — BUN+CREAT
BUN/Creatinine Ratio: 17 (ref 10–24)
BUN: 17 mg/dL (ref 8–27)
Creatinine, Ser: 0.99 mg/dL (ref 0.76–1.27)
eGFR: 78 mL/min/{1.73_m2} (ref >59)

## 2023-04-12 LAB — LITHIUM LEVEL: Lithium Lvl: 0.7 mmol/L (ref 0.5–1.2)

## 2023-04-15 ENCOUNTER — Encounter: Payer: Self-pay | Admitting: Student in an Organized Health Care Education/Training Program

## 2023-04-15 ENCOUNTER — Ambulatory Visit
Payer: Medicare Other | Attending: Student in an Organized Health Care Education/Training Program | Admitting: Student in an Organized Health Care Education/Training Program

## 2023-04-15 VITALS — BP 148/74 | HR 76 | Temp 97.3°F | Resp 18 | Ht 67.5 in | Wt 168.0 lb

## 2023-04-15 DIAGNOSIS — G608 Other hereditary and idiopathic neuropathies: Secondary | ICD-10-CM | POA: Insufficient documentation

## 2023-04-15 DIAGNOSIS — G894 Chronic pain syndrome: Secondary | ICD-10-CM | POA: Diagnosis present

## 2023-04-15 DIAGNOSIS — G5793 Unspecified mononeuropathy of bilateral lower limbs: Secondary | ICD-10-CM | POA: Insufficient documentation

## 2023-04-15 NOTE — Progress Notes (Signed)
Safety precautions to be maintained throughout the outpatient stay will include: orient to surroundings, keep bed in low position, maintain call bell within reach at all times, provide assistance with transfer out of bed and ambulation.  

## 2023-04-15 NOTE — Progress Notes (Deleted)
Safety precautions to be maintained throughout the outpatient stay will include: orient to surroundings, keep bed in low position, maintain call bell within reach at all times, provide assistance with transfer out of bed and ambulation.  

## 2023-04-15 NOTE — Progress Notes (Signed)
Patient: Adrian Lewis  Service Category: E/M  Provider: Edward Jolly, MD  DOB: 1946/11/15  DOS: 04/15/2023  Referring Provider: Elwyn Reach  MRN: [id number]  Setting: Ambulatory outpatient  PCP: Dorothey Baseman, MD  Type: New Patient  Specialty: Interventional Pain Management    Location: Office  Delivery: Face-to-face     Primary Reason(s) for Visit: Encounter for initial evaluation of one or more chronic problems (new to examiner) potentially causing chronic pain, and posing a threat to normal musculoskeletal function. (Level of risk: High) CC: Foot Pain (L>R)  HPI  Adrian Lewis is a 77 y.o. year old, male patient, who comes for the first time to our practice referred by Marcos Eke, PA-C for our initial evaluation of his chronic pain. He has History of hernia repair; Medication overdose; Insomnia due to medical condition; Generalized anxiety disorder; Osteopenia; Obstructive sleep apnea; Nephrolithiasis; Hypothyroidism; Hyponatremia; Hyperlipidemia, mixed; Elevated hemoglobin A1c; Circadian rhythm sleep disorder, delayed sleep phase type; Bilateral sciatica; Benign essential hypertension; Back pain; Abnormal ECG; Radiculopathy; Rising PSA level; Shortness of breath on exertion; Bipolar disorder II, in full remission, most recent episode depressed; High risk medication use; Mild cognitive impairment; Tremor; Paresthesia of both feet; Major depressive disorder; GERD (gastroesophageal reflux disease); Macular degeneration; Excessive drinking of alcohol; Neuropathic pain of both feet; Peripheral sensory-motor axonal polyneuropathy; and Chronic pain syndrome on their problem list. Today he comes in for evaluation of his Foot Pain (L>R)  Pain Assessment: Location: Right, Left (L.R. Toe pain is worse)   Radiating: When is at worse it can radiates up to the ankle Onset: More than a month ago Duration: Neuropathic pain Quality: Constant, Sharp, Tingling (electrical shock) Severity: 1 /10  (subjective, self-reported pain score)  Effect on ADL: "it limits my walking, unable to hike like I used to. Walking long distances is diffciult" Timing: Constant Modifying factors: "Acupunture, foot massager, soft wave, red light therapy, steriod injection to the feet (03/26/23)" BP: (!) 148/74  HR: 76  Onset and Duration: Date of onset: October 26 2022 Cause of pain: Unknown Severity: Getting better, NAS-11 at its worse: 6/10, NAS-11 at its best: 2/10, NAS-11 now: 2/10, and NAS-11 on the average: 3/10 Timing: During activity or exercise Aggravating Factors: Motion, Prolonged standing, Walking, Walking uphill, and Walking downhill Alleviating Factors: Acupuncture and wave therapy Associated Problems: Pain that does not allow patient to sleep Quality of Pain: Aching, Agonizing, Burning, Sharp, and Shooting Previous Examinations or Tests: Neurological evaluation Previous Treatments: Trigger point injections  Adrian Lewis is being evaluated for possible interventional pain management therapies for the treatment of his chronic pain.   Patient here for foot pain bilateral (L>R). Pain started December 2023. Pain since has worsen. Past 3 months have tried Acupuncture (once every 2 weeks), foot massager, soft wave, red light therapy, steriod injection to the feet (03/26/23). Most helpful has been acupuncture and steroid injection to the foot.  He is doing acupuncture every 2 weeks and states that he is receiving the greatest benefit with acupuncture.  He is not a diabetic.  His EMG/nerve conduction velocity study shows chronic sensory axonal polyneuropathy affecting the lower extremities as well as absent peroneal responses.  He has chronic L3-L4 radiculopathy and chronic L5 radiculopathy.  He denies any radiating pain from his low back to his legs.  He has tried gabapentin but did not continue due to side effects.  He has also tried alpha lipoic acid.  He has a history of bipolar type II which she  sees  psychiatry for.  Meds   Current Outpatient Medications:    Ascorbic Acid (VITAMIN C) 1000 MG tablet, Take 1,000 mg by mouth daily., Disp: , Rfl:    b complex vitamins capsule, Take 1 capsule by mouth daily., Disp: , Rfl:    buPROPion (WELLBUTRIN XL) 300 MG 24 hr tablet, Take 1 tablet (300 mg total) by mouth daily. Stop Wellbutrin SR 200 MG, Disp: 90 tablet, Rfl: 0   Coenzyme Q10 (COQ10 PO), Take 60 mg by mouth daily., Disp: , Rfl:    ergocalciferol (VITAMIN D2) 1.25 MG (50000 UT) capsule, Take 50,000 Units by mouth 2 (two) times a week., Disp: , Rfl:    lamoTRIgine (LAMICTAL) 100 MG tablet, Take 1 tablet (100 mg total) by mouth daily., Disp: 90 tablet, Rfl: 1   levothyroxine (SYNTHROID, LEVOTHROID) 25 MCG tablet, Take 25 mcg by mouth Daily. , Disp: , Rfl:    lithium carbonate (ESKALITH) 450 MG ER tablet, Take 1 tablet (450 mg total) by mouth 2 (two) times daily. Dose change, Disp: 180 tablet, Rfl: 0   magnesium oxide (MAG-OX) 400 MG tablet, Take by mouth., Disp: , Rfl:    Menaquinone-7 (VITAMIN K2) 100 MCG CAPS, Take 1 capsule by mouth daily., Disp: , Rfl:    Misc Natural Products (PROSTATE HEALTH PO), Take 1 capsule by mouth daily., Disp: , Rfl:    Multiple Vitamins-Minerals (OCUVITE PRESERVISION PO), Take 1 tablet by mouth daily., Disp: , Rfl:    Omega-3 Fatty Acids (FISH OIL) 1000 MG CAPS, Take 1,000 mg by mouth daily., Disp: , Rfl:    OVER THE COUNTER MEDICATION, Take 1 capsule by mouth daily. Adrenogen, Disp: , Rfl:    potassium citrate (UROCIT-K) 10 MEQ (1080 MG) SR tablet, 1 tablet with meals, Disp: , Rfl:    Probiotic Product (PROBIOTIC DAILY PO), Take 1 capsule by mouth daily., Disp: , Rfl:    Quercetin 500 MG CAPS, Take 1 capsule by mouth daily., Disp: , Rfl:    RESVERATROL PO, Take 500 mg by mouth daily., Disp: , Rfl:    S-Adenosylmethionine (SAME) 400 MG TABS, Take 400 mg by mouth daily., Disp: , Rfl:    senna-docusate (SENOKOT-S) 8.6-50 MG tablet, Take 1 tablet by mouth 2 (two)  times daily. While taking strongest pain meds to prevent constipation., Disp: 10 tablet, Rfl: 0   sodium fluoride (FLUORISHIELD) 1.1 % GEL dental gel, Place onto teeth., Disp: , Rfl:    telmisartan (MICARDIS) 20 MG tablet, Take 40 mg by mouth daily., Disp: , Rfl:    Tryptophan 500 MG CAPS, Take 1 capsule by mouth at bedtime., Disp: , Rfl:    eszopiclone (LUNESTA) 2 MG TABS tablet, Take 2 mg by mouth at bedtime as needed for sleep. Take immediately before bedtime (Patient not taking: Reported on 04/02/2023), Disp: , Rfl:    zaleplon (SONATA) 10 MG capsule, Take 1 capsule (10 mg total) by mouth at bedtime as needed for sleep. (Patient not taking: Reported on 04/15/2023), Disp: 30 capsule, Rfl: 2  Imaging Review   DG Cervical Spine Complete  Narrative *RADIOLOGY REPORT*  Clinical Data: History of neck pain.  CERVICAL SPINE - COMPLETE 4+ VIEW  Comparison: 06/02/2006 study.  Findings: There is no evidence of prevertebral soft tissue swelling.  There is narrowing of intervertebral disc spaces at levels of C5-C6 and C6-C7. There is marginal osteophyte formation representing degenerative spondylosis.  There is stable 2 mm of posterior subluxation of the body of C4 on C5.  There  is approximately 3 mm of posterior subluxation of the body of C5 on the body of C6.  There is apophyseal joint degenerative spondylosis.  There is slight foraminal encroachment by uncovertebral spurring at C6-C7 level on the left.  No cervical ribs are seen.  Soft tissue calcifications are seen in the soft tissues of the right side the neck.  These may be vascular.  IMPRESSION: Changes of degenerative disc disease and degenerative spondylosis. These are most severe at the C6-C7 level.There is stable 2 mm of posterior subluxation of the body of C4 on C5.  There is approximately 3 mm of posterior subluxation of the body of C5 on the body of C6.   Soft tissue calcifications are seen in the soft tissues of the right  side the neck.  These may be vascular.  Original Report Authenticated By: Crawford Givens, M.D.   Narrative Clinical Data: Low back pain for the past year. COMPLETE LUMBAR SPINE - 12/20/03 Five views demonstrate five non-rib-bearing lumbar vertebrae. Large right lateral spurs are noted at the L1-2 level. Mild anterior spur formation is noted at multiple levels. Moderate disk space narrowing with a mild vacuum phenomenon is noted at the L5-S1 level. Also noted are mild facet degenerative changes at the L5-S1 level with mild foraminal encroachment. An 11 x 7 mm calcification is noted in the expected position of the lower pole of the left kidney or, possibly, at the ureteropelvic junction. Small bilateral pelvic phleboliths. IMPRESSION 1. Degenerative changes as described above. 2. 11 x 7 mm calculus in the lower pole of the left kidney or, possibly, at the ureteropelvic junction.  Provider: Marijo Conception   Complexity Note: Imaging results reviewed.                         ROS  Cardiovascular: High blood pressure and Needs antibiotics prior to dental procedures Pulmonary or Respiratory: Snoring  Neurological: No reported neurological signs or symptoms such as seizures, abnormal skin sensations, urinary and/or fecal incontinence, being born with an abnormal open spine and/or a tethered spinal cord Psychological-Psychiatric: Depressed Gastrointestinal: No reported gastrointestinal signs or symptoms such as vomiting or evacuating blood, reflux, heartburn, alternating episodes of diarrhea and constipation, inflamed or scarred liver, or pancreas or irrregular and/or infrequent bowel movements Genitourinary: No reported renal or genitourinary signs or symptoms such as difficulty voiding or producing urine, peeing blood, non-functioning kidney, kidney stones, difficulty emptying the bladder, difficulty controlling the flow of urine, or chronic kidney disease Hematological: No reported hematological signs  or symptoms such as prolonged bleeding, low or poor functioning platelets, bruising or bleeding easily, hereditary bleeding problems, low energy levels due to low hemoglobin or being anemic Endocrine: Slow thyroid Rheumatologic: No reported rheumatological signs and symptoms such as fatigue, joint pain, tenderness, swelling, redness, heat, stiffness, decreased range of motion, with or without associated rash Musculoskeletal: Negative for myasthenia gravis, muscular dystrophy, multiple sclerosis or malignant hyperthermia Work History: Retired  Allergies  Adrian Lewis is allergic to bee venom, cat hair extract, clonidine, and grass pollen([id number] vern).  Laboratory Chemistry Profile   Renal Lab Results  Component Value Date   BUN 17 04/11/2023   CREATININE 0.99 04/11/2023   BCR 17 04/11/2023   GFRAA >60 06/10/2017   GFRNONAA >60 02/15/2021     Electrolytes Lab Results  Component Value Date   NA 138 10/11/2022   K 4.5 10/11/2022   CL 103 10/11/2022   CALCIUM 9.5 10/11/2022   PHOS 4.2 (  H) 10/11/2022     Hepatic Lab Results  Component Value Date   AST 36 06/10/2017   ALT 34 06/10/2017   ALBUMIN 4.4 10/11/2022   ALKPHOS 61 06/10/2017   AMMONIA 26 06/10/2017     ID Lab Results  Component Value Date   SARSCOV2NAA NEGATIVE 02/20/2021     Bone No results found for: "VD25OH", "VD125OH2TOT", "XB1478GN5", "AO1308MV7", "25OHVITD1", "25OHVITD2", "25OHVITD3", "TESTOFREE", "TESTOSTERONE"   Endocrine Lab Results  Component Value Date   GLUCOSE 103 (H) 10/11/2022   TSH 1.310 11/06/2022     Neuropathy Lab Results  Component Value Date   VITAMINB12 846 12/03/2022     CNS No results found for: "COLORCSF", "APPEARCSF", "RBCCOUNTCSF", "WBCCSF", "POLYSCSF", "LYMPHSCSF", "EOSCSF", "PROTEINCSF", "GLUCCSF", "JCVIRUS", "CSFOLI", "IGGCSF", "LABACHR", "ACETBL"   Inflammation (CRP: Acute  ESR: Chronic) No results found for: "CRP", "ESRSEDRATE", "LATICACIDVEN"   Rheumatology Lab  Results  Component Value Date   ANA Positive (A) 12/03/2022     Coagulation Lab Results  Component Value Date   INR 1.0 03/07/2013   LABPROT 12.9 03/07/2013   APTT 26.7 03/07/2013   PLT 253 02/15/2021     Cardiovascular Lab Results  Component Value Date   TROPONINI <0.03 06/10/2017   HGB 14.6 02/15/2021   HCT 44.0 02/15/2021     Screening Lab Results  Component Value Date   SARSCOV2NAA NEGATIVE 02/20/2021     Cancer No results found for: "CEA", "CA125", "LABCA2"   Allergens No results found for: "ALMOND", "APPLE", "ASPARAGUS", "AVOCADO", "BANANA", "BARLEY", "BASIL", "BAYLEAF", "GREENBEAN", "LIMABEAN", "WHITEBEAN", "BEEFIGE", "REDBEET", "BLUEBERRY", "BROCCOLI", "CABBAGE", "MELON", "CARROT", "CASEIN", "CASHEWNUT", "CAULIFLOWER", "CELERY"     Note: Lab results reviewed.  PFSH  Drug: Adrian Lewis  reports no history of drug use. Alcohol:  reports current alcohol use of about 28.0 standard drinks of alcohol per week. Tobacco:  reports that he has never smoked. He has never used smokeless tobacco. 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), Elevated hemoglobin A1c (08/07/2021), Excessive drinking of alcohol (01/01/2023), Generalized anxiety disorder (06/16/2017), GERD (gastroesophageal reflux disease), History of hernia repair (05/14/2012), History of kidney stones, Hyperlipidemia, mixed (12/19/2020), Hyponatremia (10/08/2013), Hypothyroidism, Insomnia due to medical condition (06/16/2017), Macular degeneration, Major depressive disorder, Medication overdose (06/16/2017), Mild cognitive impairment of uncertain or unknown etiology (01/01/2023), Nephrolithiasis (01/25/2020), Obstructive sleep apnea (10/07/2013), Osteopenia  (04/04/2014), Paresthesia of both feet (12/03/2022), Radiculopathy (02/24/2013), Rising PSA level (08/07/2021), Shortness of breath on exertion (12/19/2020), and Tremor (03/19/2022). Family: family history includes Dementia in his mother; Heart attack in his father; Parkinson's disease in his maternal aunt; Suicidality in his brother and sister.  Past Surgical History:  Procedure Laterality Date   CATARACT EXTRACTION W/ INTRAOCULAR LENS IMPLANT  2011 (APPROX)   RIGHT EYE AND REPAIR DETACHED RETINA   CYSTOSCOPY WITH RETROGRADE PYELOGRAM, URETEROSCOPY AND STENT PLACEMENT Bilateral 02/23/2021   Procedure: CYSTOSCOPY WITH RETROGRADE PYELOGRAM, URETEROSCOPY AND STENT PLACEMENT;  Surgeon: Sebastian Ache, MD;  Location: [facility];  Service: Urology;  Laterality: Bilateral;  75 MINS   EYE SURGERY     HAMMER TOE SURGERY  2012  &  2010  (APPROX)   ONE TOE , EACH FOOT   HERNIA REPAIR Bilateral    Inguinal Hernia Repair   HOLMIUM LASER APPLICATION Bilateral 02/23/2021   Procedure: HOLMIUM LASER APPLICATION;  Surgeon: Sebastian Ache, MD;  Location: [facility];  Service: Urology;  Laterality: Bilateral;   JOINT REPLACEMENT Left 2014   Partial Knee Replacement   KNEE SURGERY Left    x 2  partial and reconstructive   NASAL TURBINATE REDUCTION Bilateral 09/27/2015   Procedure: TURBINATE REDUCTION/SUBMUCOSAL RESECTION;  Surgeon: Geanie Logan, MD;  Location: [facility];  Service: ENT;  Laterality: Bilateral;   REPAIR RIGHT INGUINAL HERNIA W/ MESH  02-14-2006   SEPTOPLASTY N/A 09/27/2015   Procedure: SEPTOPLASTY;  Surgeon: Geanie Logan, MD;  Location: [facility];  Service: ENT;  Laterality: N/A;   URETEROSCOPY  08/12/2012   Procedure: URETEROSCOPY;  Surgeon: Sebastian Ache, MD;  Location[facility];  Service: Urology;  Laterality: Left;  1 HR    Active Ambulatory Problems    Diagnosis Date Noted   History of hernia repair 05/14/2012   Medication overdose 06/16/2017   Insomnia due to medical condition  06/16/2017   Generalized anxiety disorder 06/16/2017   Osteopenia 04/04/2014   Obstructive sleep apnea 10/07/2013   Nephrolithiasis 01/25/2020   Hypothyroidism 10/07/2013   Hyponatremia 10/08/2013   Hyperlipidemia, mixed 12/19/2020   Elevated hemoglobin A1c 08/07/2021   Circadian rhythm sleep disorder, delayed sleep phase type 01/21/2022   Bilateral sciatica 10/07/2013   Benign essential hypertension 12/19/2020   Back pain 02/24/2013   Abnormal ECG 12/19/2020   Radiculopathy 02/24/2013   Rising PSA level 08/07/2021   Shortness of breath on exertion 12/19/2020   Bipolar disorder II, in full remission, most recent episode depressed 01/21/2022   High risk medication use 01/21/2022   Mild cognitive impairment 01/01/2023   Tremor 03/19/2022   Paresthesia of both feet 12/03/2022   Major depressive disorder 12/31/2022   GERD (gastroesophageal reflux disease) 12/31/2022   Macular degeneration 12/31/2022   Excessive drinking of alcohol 01/01/2023   Neuropathic pain of both feet 04/15/2023   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   Chronic pain syndrome 04/15/2023   Resolved Ambulatory Problems    Diagnosis Date Noted   Other specified postprocedural states 04/05/2013   Past Medical History:  Diagnosis Date   Arthritis    History of kidney stones    Mild cognitive impairment of uncertain or unknown etiology 01/01/2023   Constitutional Exam  General appearance: Well nourished, well developed, and well hydrated. In no apparent acute distress Vitals:   04/15/23 0959  BP: (!) 148/74  Pulse: 76  Resp: 18  Temp: (!) 97.3 F (36.3 C)  TempSrc: Temporal  Weight: 168 lb (76.2 kg)  Height: 5' 7.5" (1.715 m)   BMI Assessment: Estimated body mass index is 25.92 kg/m as calculated from the following:   Height as of this encounter: 5' 7.5" (1.715 m).   Weight as of this encounter: 168 lb (76.2 kg).  BMI interpretation table: BMI level Category Range association  with higher incidence of chronic pain  <18 kg/m2 Underweight   18.5-24.9 kg/m2 Ideal body weight   25-29.9 kg/m2 Overweight Increased incidence by 20%  30-34.9 kg/m2 Obese (Class I) Increased incidence by 68%  35-39.9 kg/m2 Severe obesity (Class II) Increased incidence by 136%  >40 kg/m2 Extreme obesity (Class III) Increased incidence by 254%   Patient's current BMI Ideal Body weight  Body mass index is 25.92 kg/m. Ideal body weight: 67.2 kg (148 lb 4.1 oz) Adjusted ideal body weight: 70.8 kg (156 lb 2.5 oz)   BMI Readings from Last 4 Encounters:  04/15/23 25.92 kg/m  04/02/23 27.10 kg/m  04/01/23 27.10 kg/m  01/21/23 27.41 kg/m   Wt Readings  from Last 4 Encounters:  04/15/23 168 lb (76.2 kg)  04/02/23 173 lb (78.5 kg)  04/01/23 173 lb (78.5 kg)  01/21/23 175 lb (79.4 kg)    Psych/Mental status: Alert, oriented x 3 (person, place, & time)       Eyes: PERLA Respiratory: No evidence of acute respiratory distress  Lower Extremity Exam    Side: Right lower extremity  Side: Left lower extremity  Stability: No instability observed          Stability: No instability observed          Skin & Extremity Inspection: Skin color, temperature, and hair growth are WNL. No peripheral edema or cyanosis. No masses, redness, swelling, asymmetry, or associated skin lesions. No contractures.  Skin & Extremity Inspection: Skin color, temperature, and hair growth are WNL. No peripheral edema or cyanosis. No masses, redness, swelling, asymmetry, or associated skin lesions. No contractures.  Functional ROM: Unrestricted ROM                  Functional ROM: Unrestricted ROM                  Muscle Tone/Strength: Functionally intact. No obvious neuro-muscular anomalies detected.  Muscle Tone/Strength: Functionally intact. No obvious neuro-muscular anomalies detected.  Sensory (Neurological): Neuropathic pain pattern        Sensory (Neurological): Neuropathic pain pattern        DTR: Patellar:  deferred today Achilles: deferred today Plantar: deferred today  DTR: Patellar: deferred today Achilles: deferred today Plantar: deferred today  Palpation: No palpable anomalies  Palpation: No palpable anomalies    Assessment  Primary Diagnosis & Pertinent Problem List: The primary encounter diagnosis was Neuropathic pain of both feet. Diagnoses of Peripheral sensory-motor axonal polyneuropathy and Chronic pain syndrome were also pertinent to this visit.  Visit Diagnosis (New problems to examiner): 1. Neuropathic pain of both feet   2. Peripheral sensory-motor axonal polyneuropathy   3. Chronic pain syndrome    Plan of Care (Initial workup plan)  Rhyden has bilateral neuropathic pain of his feet related to chronic sensory axonal polyneuropathy.  He has tried various medications with limited response.  Encouraged him to continue with acupuncture.  We discussed lidocaine infusion as well as spinal cord stimulation in detail.  Patient is less interested in spinal cord stimulation.  If he would like to move forward with a lidocaine infusion, informed him to contact my clinic and we could get that scheduled.  Patient endorsed understanding.   Provider-requested follow-up: Return for patient will call to schedule F2F appt prn.  Future Appointments  Date Time Provider Department Center  05/26/2023  1:15 PM [place] [facility]  06/30/2023  2:00 PM Jomarie Longs, MD [facility] None  08/05/2023 11:30 AM Marcos Eke, PA-C [facility] None    Duration of encounter: .  Total time on encounter, as per AMA guidelines included both the face-to-face and non-face-to-face time personally spent by the physician and/or other qualified health care professional(s) on the day of the encounter (includes time in activities that require the physician or other qualified health care professional and does not include time in activities normally performed by clinical staff). Physician's  time may include the following activities when performed: Preparing to see the patient (e.g., pre-charting review of records, searching for previously ordered imaging, lab work, and nerve conduction tests) Review of prior analgesic pharmacotherapies. Reviewing PMP Interpreting ordered tests (e.g., lab work, imaging, nerve conduction tests) Performing post-procedure evaluations, including  interpretation of diagnostic procedures Obtaining and/or reviewing separately obtained history Performing a medically appropriate examination and/or evaluation Counseling and educating the patient/family/caregiver Ordering medications, tests, or procedures Referring and communicating with other health care professionals (when not separately reported) Documenting clinical information in the electronic or other health record Independently interpreting results (not separately reported) and communicating results to the patient/ family/caregiver Care coordination (not separately reported)  Note by: Edward Jolly, MD (TTS technology used. I apologize for any typographical errors that were not detected and corrected.) Date: 04/15/2023; Time: 11:03 AM

## 2023-05-13 ENCOUNTER — Other Ambulatory Visit: Payer: Self-pay | Admitting: Psychiatry

## 2023-05-13 ENCOUNTER — Other Ambulatory Visit: Payer: Self-pay | Admitting: Student

## 2023-05-13 DIAGNOSIS — R0602 Shortness of breath: Secondary | ICD-10-CM

## 2023-05-13 DIAGNOSIS — I739 Peripheral vascular disease, unspecified: Secondary | ICD-10-CM

## 2023-05-13 DIAGNOSIS — G4701 Insomnia due to medical condition: Secondary | ICD-10-CM

## 2023-05-13 DIAGNOSIS — I2510 Atherosclerotic heart disease of native coronary artery without angina pectoris: Secondary | ICD-10-CM

## 2023-05-14 ENCOUNTER — Other Ambulatory Visit: Payer: Self-pay | Admitting: Psychiatry

## 2023-05-14 DIAGNOSIS — G4701 Insomnia due to medical condition: Secondary | ICD-10-CM

## 2023-05-14 MED ORDER — ZALEPLON 10 MG PO CAPS
10.0000 mg | ORAL_CAPSULE | Freq: Every evening | ORAL | 0 refills | Status: DC | PRN
Start: 2023-05-14 — End: 2023-06-13

## 2023-05-14 NOTE — Telephone Encounter (Signed)
Ordered

## 2023-05-14 NOTE — Telephone Encounter (Signed)
need refill on the zaleplon 10mg . pt was last seen on 5-7 net appt 8-8

## 2023-05-20 ENCOUNTER — Encounter
Admission: RE | Admit: 2023-05-20 | Discharge: 2023-05-20 | Disposition: A | Discharge status: Home / Self Care | Payer: Medicare Other | Source: Ambulatory Visit | Attending: Student | Admitting: Student

## 2023-05-20 DIAGNOSIS — R0602 Shortness of breath: Secondary | ICD-10-CM | POA: Diagnosis present

## 2023-05-20 DIAGNOSIS — I739 Peripheral vascular disease, unspecified: Secondary | ICD-10-CM | POA: Insufficient documentation

## 2023-05-20 DIAGNOSIS — I2510 Atherosclerotic heart disease of native coronary artery without angina pectoris: Secondary | ICD-10-CM | POA: Insufficient documentation

## 2023-05-20 LAB — NM MYOCAR MULTI W/SPECT W/WALL MOTION / EF
Nuc Stress EF: 70 %
SDS: 0

## 2023-05-20 MED ORDER — TECHNETIUM TC 99M TETROFOSMIN IV KIT
10.0000 | PACK | Freq: Once | INTRAVENOUS | Status: AC | PRN
  Administered 2023-05-20: 9.94 via INTRAVENOUS

## 2023-05-20 MED ORDER — TECHNETIUM TC 99M TETROFOSMIN IV KIT
28.0000 | PACK | Freq: Once | INTRAVENOUS | Status: AC | PRN
  Administered 2023-05-20: 28 via INTRAVENOUS

## 2023-05-21 LAB — NM MYOCAR MULTI W/SPECT W/WALL MOTION / EF
Estimated workload: 7
Exercise duration (sec): 2 s
MPHR: 142 {beats}/min
Rest Nuclear Isotope Dose: 9.9 mCi
SSS: 0
Stress Nuclear Isotope Dose: 28 mCi

## 2023-05-23 ENCOUNTER — Telehealth: Payer: Self-pay | Admitting: Student

## 2023-05-23 LAB — NM MYOCAR MULTI W/SPECT W/WALL MOTION / EF
Angina Index: 0
Base ST Depression (mm): 0 mm
Duke Treadmill Score: 6
Exercise duration (min): 6 min
LV dias vol: 54 mL (ref 62–150)
LV sys vol: 16 mL
Peak HR: 113 {beats}/min
Percent HR: 79 %
Rest HR: 60 {beats}/min
SRS: 2
ST Depression (mm): 0 mm
TID: 0.75

## 2023-05-23 NOTE — Telephone Encounter (Signed)
Encounter created in error

## 2023-05-26 ENCOUNTER — Ambulatory Visit (INDEPENDENT_AMBULATORY_CARE_PROVIDER_SITE_OTHER): Payer: Medicare Other | Admitting: Podiatry

## 2023-05-26 DIAGNOSIS — M19079 Primary osteoarthritis, unspecified ankle and foot: Secondary | ICD-10-CM

## 2023-05-26 DIAGNOSIS — M778 Other enthesopathies, not elsewhere classified: Secondary | ICD-10-CM

## 2023-05-26 NOTE — Progress Notes (Signed)
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  He states that his pain in general is about a 6 out of 10.  He states that he would like to go ahead and have some injections if possible today.  Objective: Vital signs are stable alert and oriented x 3.  Pulses are palpable.  There is no erythema edema cellulitis drainage or odor he has pes planovalgus with hypertrophy of the bone around the first tarsometatarsal joint exquisitely tender dorsally and plantarly.  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  Plan: He is going continue his red light therapy for his neuropathy he is also going to receive injections today 20 mg of Kenalog 5 mg Marcaine plantar aspect of the bilateral foot around the tarsometatarsal joints.  He tolerated procedure well without complications.

## 2023-06-06 ENCOUNTER — Other Ambulatory Visit: Payer: Self-pay | Admitting: Psychiatry

## 2023-06-07 LAB — LITHIUM LEVEL: Lithium Lvl: 1.1 mmol/L (ref 0.5–1.2)

## 2023-06-09 ENCOUNTER — Telehealth: Payer: Self-pay | Admitting: Psychiatry

## 2023-06-09 DIAGNOSIS — Z79899 Other long term (current) drug therapy: Secondary | ICD-10-CM

## 2023-06-09 DIAGNOSIS — F3176 Bipolar disorder, in full remission, most recent episode depressed: Secondary | ICD-10-CM

## 2023-06-09 NOTE — Telephone Encounter (Signed)
Contacted patient to discuss lithium level being elevated at 1.1. Patient reports he is taking lithium 450 mg - 1-1/2 tablets in the morning and 1 tablet in the evening. Discussed with patient to reduce the lithium to 1 tablet twice a day and repeat levels in 5 days from now.  Patient reports he has other appointments and surgery coming up of his thyroid and hence he will get the lithium levels drawn again on Tuesday morning.  He does have a lab slip for lithium level with him which she will take to go to nearest [facility] and also order bun, creatinine.  CMA to fax it to [facility] of choice.  Spouse-Jennifer was also on this call-both patient as well as spouse agrees with plan.  Patient is scheduled to get thyroid surgery-on August 1.

## 2023-06-09 NOTE — Telephone Encounter (Signed)
 Noted  

## 2023-06-09 NOTE — Telephone Encounter (Signed)
Spoke to patient he stated that he would have the lab work done in week and he would like for the order to be faxed to the [facility]  on [address]

## 2023-06-12 ENCOUNTER — Telehealth: Payer: Self-pay

## 2023-06-12 DIAGNOSIS — G4701 Insomnia due to medical condition: Secondary | ICD-10-CM

## 2023-06-12 NOTE — Telephone Encounter (Signed)
Medication refill - Fax from patient's [facility] for a new Zaleplon 10 mg order, last provided 05/14/23 and patient returns next on 07/03/23.

## 2023-06-13 MED ORDER — ZALEPLON 10 MG PO CAPS
10.0000 mg | ORAL_CAPSULE | Freq: Every evening | ORAL | 0 refills | Status: DC | PRN
Start: 2023-06-13 — End: 2023-07-03

## 2023-06-13 NOTE — Telephone Encounter (Signed)
I have sent Zaleplon to pharmacy.

## 2023-06-18 LAB — BUN+CREAT
BUN/Creatinine Ratio: 12 (ref 10–24)
BUN: 13 mg/dL (ref 8–27)
Creatinine, Ser: 1.05 mg/dL (ref 0.76–1.27)
eGFR: 73 mL/min/{1.73_m2} (ref >59)

## 2023-06-18 LAB — LITHIUM LEVEL: Lithium Lvl: 0.8 mmol/L (ref 0.5–1.2)

## 2023-06-19 ENCOUNTER — Telehealth: Payer: Self-pay | Admitting: Psychiatry

## 2023-06-19 NOTE — Telephone Encounter (Signed)
Pt.notified

## 2023-06-19 NOTE — Telephone Encounter (Signed)
I have reviewed lithium level and renal function for this patient-currently looks therapeutic and within normal limits.  Will have staff contact patient to let them know to continue the current dosage of lithium.

## 2023-06-23 ENCOUNTER — Telehealth: Payer: Self-pay

## 2023-06-23 DIAGNOSIS — F3176 Bipolar disorder, in full remission, most recent episode depressed: Secondary | ICD-10-CM

## 2023-06-23 MED ORDER — BUPROPION HCL ER (SR) 200 MG PO TB12: 200.0000 mg | ORAL_TABLET | Freq: Every morning | ORAL | 1 refills | Status: DC

## 2023-06-23 NOTE — Telephone Encounter (Signed)
pt spouse left a message states she sent a mychart message. she states that her husband has been taking the bupropion 200mg  and somehow the rx got switch to 300mg . he can not take the 300mg  because it causes him to have tremors. please send in a new rx for the 200mg .  Last seen on5-7-24 next appt 07-03-23

## 2023-06-23 NOTE — Telephone Encounter (Signed)
pt wife notified rx sent to pharmacy for the 200mg 

## 2023-06-23 NOTE — Telephone Encounter (Signed)
Patient was changed to Wellbutrin 300 mg several months ago to target his fatigue, concentration during the day.  Will change to bupropion/Wellbutrin SR 200 mg daily due to side effects.

## 2023-06-25 ENCOUNTER — Telehealth: Payer: Self-pay | Admitting: Internal Medicine

## 2023-06-25 HISTORY — PX: OTHER SURGICAL HISTORY: SHX169

## 2023-06-25 NOTE — Telephone Encounter (Signed)
I spoke with the patient's wife (DPR), she said they found his BiPAP download from his last visit with Dr. Belia Heman and wanted to know if that had the setting on it. I did confirm that it does have his settings on the download and Dr. Belia Heman did not change them at his last visit. She said she will take the download with her to his surgery tomorrow.  Nothing further needed.

## 2023-06-25 NOTE — Telephone Encounter (Signed)
Patient's wife called to request the settings for patient's Bipap machine before his surgery tomorrow.  She stated she just found out that if they get the settings, he would not have to bring the whole machine.  Please advise and call to discuss further asap,  CB# [phone]

## 2023-06-26 DIAGNOSIS — R001 Bradycardia, unspecified: Secondary | ICD-10-CM

## 2023-06-26 HISTORY — PX: OTHER SURGICAL HISTORY: SHX169

## 2023-06-26 HISTORY — DX: Bradycardia, unspecified: R00.1

## 2023-06-30 ENCOUNTER — Telehealth: Payer: Medicare Other | Admitting: Psychiatry

## 2023-06-30 DIAGNOSIS — F3170 Bipolar disorder, currently in remission, most recent episode unspecified: Secondary | ICD-10-CM | POA: Insufficient documentation

## 2023-07-03 ENCOUNTER — Encounter: Payer: Self-pay | Admitting: Psychiatry

## 2023-07-03 ENCOUNTER — Telehealth (INDEPENDENT_AMBULATORY_CARE_PROVIDER_SITE_OTHER): Payer: Medicare Other | Admitting: Psychiatry

## 2023-07-03 DIAGNOSIS — G3184 Mild cognitive impairment, so stated: Secondary | ICD-10-CM

## 2023-07-03 DIAGNOSIS — G4701 Insomnia due to medical condition: Secondary | ICD-10-CM | POA: Diagnosis not present

## 2023-07-03 DIAGNOSIS — Z79899 Other long term (current) drug therapy: Secondary | ICD-10-CM | POA: Diagnosis not present

## 2023-07-03 DIAGNOSIS — F3176 Bipolar disorder, in full remission, most recent episode depressed: Secondary | ICD-10-CM | POA: Diagnosis not present

## 2023-07-03 MED ORDER — ZALEPLON 10 MG PO CAPS
10.0000 mg | ORAL_CAPSULE | Freq: Every evening | ORAL | 1 refills | Status: AC | PRN
Start: 2023-07-03 — End: ?

## 2023-07-03 NOTE — Progress Notes (Signed)
Virtual Visit via Video Note  I connected with Adrian Lewis on 07/03/23 at  1:00 PM EDT by a video enabled telemedicine application and verified that I am speaking with the correct person using two identifiers.  Location Provider Location : [facility] Patient Location : Home  Participants: Patient , Spouse,Provider   I discussed the limitations of evaluation and management by telemedicine and the availability of in person appointments. The patient expressed understanding and agreed to proceed.    I discussed the assessment and treatment plan with the patient. The patient was provided an opportunity to ask questions and all were answered. The patient agreed with the plan and demonstrated an understanding of the instructions.   The patient was advised to call back or seek an in-person evaluation if the symptoms worsen or if the condition fails to improve as anticipated.   BH MD OP Progress Note  07/03/2023 1:31 PM Adrian Lewis  MRN:  [id number]  Chief Complaint:  Chief Complaint  Patient presents with   Follow-up   Depression   Medication Refill   Medication Reaction   HPI: Adrian Lewis is a 77 year old Caucasian male, retired, Careers information officer professor, married, lives in [place], has a history of bipolar disorder type II, hypothyroidism, obstructive sleep apnea on CPAP, hypertension, primary osteoarthritis was evaluated by telemedicine today.  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  I had reviewed notes for Dr. Roma Schanz.  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  While in the hospital lithium level on 06/26/2023 was 1.1.  It is possible lithium likely causing new onset bradycardia and sinus node dysfunction.  Upon discussion between cardiology and psychiatric it was agreed upon that patient would continue the lower dosage of lithium 450 mg p.o. daily.  He will also wear a Zio patch for 14 days outpatient.  Patient to  follow up with local psychiatrist in [facility] electrophysiology.'  Patient today appeared to be alert, oriented to person place time and situation.  3 word memory immediate 3 out of 3, after 5 minutes 3 out of 3.  Patient was able to spell the word 'WORLD' forward and backward.  Patient reports he feels tired since he is recovering from surgery.  Otherwise does not see any changes with his mood.  Although he is worried about the fact that lithium likely causing his cardiac effects.  Collateral information obtained from wife who also raised the same concern.  Patient as well as spouse wonders whether he should stay on the lithium or change to a new psychotropic.  Patient currently denies any suicidality, homicidality or perceptual disturbances.  Patient currently on theophylline.  He took the theophylline dosage last night and has had insomnia.  He is planning to start taking it in the morning.  Patient denies any other concerns today.  Visit Diagnosis:    ICD-10-CM   1. Bipolar disorder, in full remission, most recent episode depressed (HCC)  F31.76 Lithium level   Type 2    2. Insomnia due to medical condition  G47.01 zaleplon (SONATA) 10 MG capsule   OSA with CPAP, mood disorder    3. Mild cognitive impairment  G31.84     4. High risk medication use  Z79.899 Lithium level      Past Psychiatric History: I have reviewed past psychiatric history from progress note on 01/21/2022.  Past trials of medications like Lunesta, Sonata, Belsomra-cost.  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   Elevated hemoglobin A1c 08/07/2021   Excessive drinking of alcohol 01/01/2023   01/01/23 - reported 4 hard ciders (4.5%  ABV) nightly   Generalized anxiety disorder 06/16/2017   GERD (gastroesophageal reflux disease)    History of hernia repair 05/14/2012   History of kidney stones    Hyperlipidemia, mixed 12/19/2020   Hyponatremia 10/08/2013   Hypothyroidism    Insomnia due to medical condition 06/16/2017   Macular degeneration    Major depressive disorder    Medication overdose 06/16/2017   Mild cognitive impairment of uncertain or unknown etiology 01/01/2023   Nephrolithiasis 01/25/2020   Obstructive sleep apnea 10/07/2013   Osteopenia 04/04/2014   Paresthesia of both feet 12/03/2022   Radiculopathy 02/24/2013   Rising PSA level 08/07/2021   Shortness of breath on exertion 12/19/2020   Tremor 03/19/2022    Past Surgical History:  Procedure Laterality Date   CATARACT EXTRACTION W/ INTRAOCULAR LENS IMPLANT  2011 (APPROX)   RIGHT EYE AND REPAIR DETACHED RETINA   CYSTOSCOPY WITH RETROGRADE PYELOGRAM, URETEROSCOPY AND STENT PLACEMENT Bilateral 02/23/2021   Procedure: CYSTOSCOPY WITH RETROGRADE PYELOGRAM, URETEROSCOPY AND STENT PLACEMENT;  Surgeon: Sebastian Ache, MD;  Location: [facility];  Service: Urology;  Laterality: Bilateral;  75 MINS   EYE SURGERY     HAMMER TOE SURGERY  2012  &  2010  (APPROX)   ONE TOE , EACH FOOT   HERNIA REPAIR Bilateral    Inguinal Hernia Repair   HOLMIUM LASER APPLICATION Bilateral 02/23/2021   Procedure: HOLMIUM LASER APPLICATION;  Surgeon: Sebastian Ache, MD;  Location: [facility];  Service: Urology;  Laterality: Bilateral;   JOINT REPLACEMENT Left 2014   Partial Knee Replacement   KNEE SURGERY Left    x 2  partial and reconstructive   NASAL TURBINATE REDUCTION Bilateral 09/27/2015   Procedure: TURBINATE REDUCTION/SUBMUCOSAL RESECTION;  Surgeon: Geanie Logan, MD;  Location: [facility];  Service: ENT;  Laterality: Bilateral;   REPAIR RIGHT INGUINAL HERNIA W/ MESH  02-14-2006   SEPTOPLASTY N/A 09/27/2015   Procedure: SEPTOPLASTY;  Surgeon: Geanie Logan, MD;  Location: [facility];   Service: ENT;  Laterality: N/A;   URETEROSCOPY  08/12/2012   Procedure: URETEROSCOPY;  Surgeon: Sebastian Ache, MD;  Location: [facility];  Service: Urology;  Laterality: Left;  1 HR     Family Psychiatric History: I have reviewed family psychiatric history from progress note on 01/21/2022.  Family History:  Family History  Problem Relation Age of Onset   Dementia Mother        with advanced age; ~31s   Heart attack Father    Suicidality Sister    Suicidality Brother    Parkinson's disease Maternal Aunt     Social History: I have reviewed social history from progress note on 01/21/2022. Social History   Socioeconomic History   Marital status: Married    Spouse name: Not on file   Number of children: Not on file   Years of education: 20   Highest education level: Doctorate  Occupational History   Occupation: Retired    Comment: English professor  Tobacco Use   Smoking status: Never   Smokeless tobacco: Never  Vaping Use   Vaping status: Never Used  Substance and Sexual Activity  Alcohol use: Yes    Alcohol/week: 28.0 standard drinks of alcohol    Types: 28 Standard drinks or equivalent per week    Comment: 4 hard ciders nightly   Drug use: No   Sexual activity: Not Currently  Other Topics Concern   Not on file  Social History Narrative   Right handed   Drinks caffeine   Two story home   Lives with wife in the home   retired   Chief Executive Officer Determinants of Health   Financial Resource Strain: Low Risk  (06/27/2023)   Received [facility]   Overall Financial Resource Strain (CARDIA)    Difficulty of Paying Living Expenses: Not hard at all  Food Insecurity: No Food Insecurity (06/27/2023)   Received from [facility]   Hunger Vital Sign    Worried About Running Out of Food in the Last Year: Never true    Ran Out of Food in the Last Year: Never true  Transportation Needs: No Transportation Needs (06/27/2023)   Received from [facility]    PRAPARE - Transportation    Lack of Transportation (Medical): No    Lack of Transportation (Non-Medical): No  Physical Activity: Not on file  Stress: Not on file  Social Connections: Not on file    Allergies:  Allergies  Allergen Reactions   Bee Venom Anaphylaxis    Has EPI pen for this   Cat Hair Extract Other (See Comments)    Per pt his nose runs/congestion.    Clonidine Other (See Comments)    fatigue fatigue fatigue   Grass Pollen(K-O-R-T-Swt Vern) Other (See Comments)    Runny nose    Metabolic Disorder Labs: No results found for: "HGBA1C", "MPG" No results found for: "PROLACTIN" No results found for: "CHOL", "TRIG", "HDL", "CHOLHDL", "VLDL", "LDLCALC" Lab Results  Component Value Date   TSH 1.310 11/06/2022   TSH 1.030 03/04/2022    Therapeutic Level Labs: Lab Results  Component Value Date   LITHIUM 0.8 06/17/2023   LITHIUM 1.1 06/06/2023   No results found for: "VALPROATE" No results found for: "CBMZ"  Current Medications: Current Outpatient Medications  Medication Sig Dispense Refill   Ascorbic Acid (VITAMIN C) 1000 MG tablet Take 1,000 mg by mouth daily.     b complex vitamins capsule Take 1 capsule by mouth daily.     buPROPion (WELLBUTRIN SR) 200 MG 12 hr tablet Take 1 tablet (200 mg total) by mouth in the morning. 90 tablet 1   Coenzyme Q10 (COQ10 PO) Take 60 mg by mouth daily.     ergocalciferol (VITAMIN D2) 1.25 MG (50000 UT) capsule Take 50,000 Units by mouth 2 (two) times a week.     lamoTRIgine (LAMICTAL) 100 MG tablet Take 1 tablet (100 mg total) by mouth daily. 90 tablet 1   levothyroxine (SYNTHROID, LEVOTHROID) 25 MCG tablet Take 25 mcg by mouth Daily.      lithium carbonate (ESKALITH) 450 MG ER tablet Take 1 tablet (450 mg total) by mouth 2 (two) times daily. Dose change (Patient taking differently: Take 450 mg by mouth daily. Dose change) 180 tablet 0   magnesium oxide (MAG-OX) 400 MG tablet Take by mouth.     Menaquinone-7 (VITAMIN K2) 100  MCG CAPS Take 1 capsule by mouth daily.     Misc Natural Products (PROSTATE HEALTH PO) Take 1 capsule by mouth daily.     Multiple Vitamins-Minerals (OCUVITE PRESERVISION PO) Take 1 tablet by mouth daily.     Omega-3  Fatty Acids (FISH OIL) 1000 MG CAPS Take 1,000 mg by mouth daily.     OVER THE COUNTER MEDICATION Take 1 capsule by mouth daily. Adrenogen     potassium citrate (UROCIT-K) 10 MEQ (1080 MG) SR tablet 1 tablet with meals     Probiotic Product (PROBIOTIC DAILY PO) Take 1 capsule by mouth daily.     Quercetin 500 MG CAPS Take 1 capsule by mouth daily.     RESVERATROL PO Take 500 mg by mouth daily.     S-Adenosylmethionine (SAME) 400 MG TABS Take 400 mg by mouth daily.     senna-docusate (SENOKOT-S) 8.6-50 MG tablet Take 1 tablet by mouth 2 (two) times daily. While taking strongest pain meds to prevent constipation. 10 tablet 0   sodium fluoride (FLUORISHIELD) 1.1 % GEL dental gel Place onto teeth.     telmisartan (MICARDIS) 20 MG tablet Take 40 mg by mouth daily.     theophylline (THEO-24) 300 MG 24 hr capsule Take 300 mg by mouth daily.     Tryptophan 500 MG CAPS Take 1 capsule by mouth at bedtime.     zaleplon (SONATA) 10 MG capsule Take 1 capsule (10 mg total) by mouth at bedtime as needed for sleep. 30 capsule 1   No current facility-administered medications for this visit.     Musculoskeletal: Strength & Muscle Tone:  UTA Gait & Station:  Seated Patient leans: N/A  Psychiatric Specialty Exam: Review of Systems  Psychiatric/Behavioral:  The patient is nervous/anxious.     There were no vitals taken for this visit.There is no height or weight on file to calculate BMI.  General Appearance: Fairly Groomed  Eye Contact:  Fair  Speech:  Clear and Coherent  Volume:  Normal  Mood:  Anxious  Affect:  Congruent  Thought Process:  Goal Directed and Descriptions of Associations: Intact  Orientation:  Full (Time, Place, and Person)  Thought Content: Logical   Suicidal  Thoughts:  No  Homicidal Thoughts:  No  Memory:  Immediate;   Fair Recent;   Fair Remote;   Fair  Judgement:  Fair  Insight:  Fair  Psychomotor Activity:  Normal  Concentration:  Concentration: Fair and Attention Span: Fair  Recall:  [employer] of Knowledge: Fair  Language: Fair  Akathisia:  No  Handed:  Right  AIMS (if indicated): not done  Assets:  Communication Skills Desire for Improvement Housing Intimacy Social Support  ADL's:  Intact  Cognition: WNL  Sleep:   was poor last night likely due to new medication   Screenings: GAD-7    Loss adjuster, chartered Office Visit from 04/01/2023 in [facility]  Total GAD-7 Score 0      PHQ2-9    Flowsheet Row Office Visit from 04/01/2023 in [facility] Video Visit from 11/26/2022 in [facility] Video Visit from 10/10/2022 in [facility] Video Visit from 07/17/2022 in [facility] Office Visit from 01/21/2022 in [facility]  PHQ-2 Total Score 1 0 0 3 0  PHQ-9 Total Score 5 -- -- 9 --      Flowsheet Row Video Visit from 07/03/2023 in [facility] Office Visit from 04/01/2023 in [facility] Video Visit from 11/26/2022 in [facility]  C-SSRS RISK CATEGORY No Risk No Risk No Risk  Assessment and Plan: DENO DILKS is a 77 year old Caucasian male who has a history of bipolar disorder type II, insomnia, cognitive disorder likely mild, status post thyroidectomy, recent cardiac changes including bradycardia nodal dysfunction, likely due to lithium, reviewed and discussed the following plan with patient who is currently stable on current medication regimen.  Plan Bipolar disorder type II  depressed in remission Wellbutrin XL 300 mg p.o. daily Continue lithium 450 mg p.o. once daily. Lamotrigine 100 mg p.o. daily.  Discussed with patient as well as spouse that if they are agreeable could continue lithium at this lower dosage as recommended by cardiology/psychiatry team during his recent admission.  He could complete the Zio patch monitoring and once he has his cardiology follow-up visit to discuss report of his heart monitor, we could discuss further changes.  In the meantime if he is having any worsening mood symptoms will recommend starting psychotherapy session/CBT.  Also advised to get immediate help by going to the nearest emergency department if any cardiac changes or otherwise.  Insomnia-improving Patient to start taking theophylline in the morning. Sonata 10 mg p.o. nightly. Continue CPAP for OSA. Reviewed [place] PMP AWARxE   Mild cognitive impairment-chronic MOCA - 03/27/2022-25/30 Patient was referred to a neurologist.  Will consider repeating this in the future  High risk medication use-reviewed and discussed lithium level-06/30/2023-0.7-therapeutic BMP within normal limits except for chloride-112 Will order another lithium level-patient to go and get lithium repeated in a week from now at [facility].   Collateral formation obtained from spouse as noted above.  I have reviewed notes per Dr. Lenon Ahmadi medical admission for thyroidectomy as noted above-06/26/2023.  Collaboration of Care: Collaboration of Care: Referral or follow-up with counselor/therapist AEB discussed establishing care with therapist.  Patient/Guardian was advised Release of Information must be obtained prior to any record release in order to collaborate their care with an outside provider. Patient/Guardian was advised if they have not already done so to contact the registration department to sign all necessary forms in order for [place] to release information regarding their care.   Consent: Patient/Guardian  gives verbal consent for treatment and assignment of benefits for services provided during this visit. Patient/Guardian expressed understanding and agreed to proceed.  I have spent atleast 40  minutes face to face with patient today which includes the time spent for preparing to see the patient ( e.g., review of test, records ), obtaining and to review and separately obtained history , ordering medications and test ,psychoeducation and supportive psychotherapy and care coordination,as well as documenting clinical information in electronic health record,interpreting and communication of test results.  This note was generated in part or whole with voice recognition software. Voice recognition is usually quite accurate but there are transcription errors that can and very often do occur. I apologize for any typographical errors that were not detected and corrected.    Jomarie Longs, MD 07/04/2023, 8:46 AM

## 2023-07-10 ENCOUNTER — Other Ambulatory Visit: Payer: Self-pay | Admitting: Psychiatry

## 2023-07-11 ENCOUNTER — Telehealth: Payer: Self-pay | Admitting: Psychiatry

## 2023-07-11 LAB — BUN+CREAT
BUN/Creatinine Ratio: 20 (ref 10–24)
BUN: 18 mg/dL (ref 8–27)
Creatinine, Ser: 0.89 mg/dL (ref 0.76–1.27)
eGFR: 88 mL/min/{1.73_m2} (ref >59)

## 2023-07-11 LAB — LITHIUM LEVEL: Lithium Lvl: 0.4 mmol/L — ABNORMAL LOW (ref 0.5–1.2)

## 2023-07-11 NOTE — Telephone Encounter (Signed)
pt wife was given the info. patient was not available.

## 2023-07-11 NOTE — Telephone Encounter (Signed)
I have reviewed lithium level-0.4 subtherapeutic outpatient with recent cardiac effect on higher dosage of lithium will recommend continuing the same dosage as prescribed now and as recommended by cardiology and psychiatry team during his recent admission.  BUN/creatinine-within normal limits.  GFR-within normal limits.

## 2023-07-15 ENCOUNTER — Encounter: Payer: Self-pay | Admitting: Otolaryngology

## 2023-07-15 ENCOUNTER — Other Ambulatory Visit: Payer: Self-pay | Admitting: Otolaryngology

## 2023-07-15 DIAGNOSIS — R1310 Dysphagia, unspecified: Secondary | ICD-10-CM

## 2023-07-15 DIAGNOSIS — R633 Feeding difficulties, unspecified: Secondary | ICD-10-CM

## 2023-07-15 DIAGNOSIS — T17908A Unspecified foreign body in respiratory tract, part unspecified causing other injury, initial encounter: Secondary | ICD-10-CM

## 2023-07-15 DIAGNOSIS — T17928A Food in respiratory tract, part unspecified causing other injury, initial encounter: Secondary | ICD-10-CM

## 2023-07-16 ENCOUNTER — Ambulatory Visit: Payer: Medicare Other | Attending: Otolaryngology | Admitting: Speech Pathology

## 2023-07-16 DIAGNOSIS — J3801 Paralysis of vocal cords and larynx, unilateral: Secondary | ICD-10-CM | POA: Insufficient documentation

## 2023-07-16 DIAGNOSIS — R490 Dysphonia: Secondary | ICD-10-CM | POA: Diagnosis present

## 2023-07-16 DIAGNOSIS — R1313 Dysphagia, pharyngeal phase: Secondary | ICD-10-CM | POA: Diagnosis present

## 2023-07-16 NOTE — Therapy (Signed)
OUTPATIENT SPEECH LANGUAGE PATHOLOGY  VOICE EVALUATION Dysphagia Evaluation   Patient Name: Adrian Lewis MRN: [id number] DOB:02/13/1946, 77 y.o., male Today's Date: 07/16/2023  PCP: Dorothey Baseman, MD REFERRING PROVIDER: Geanie Logan, MD   End of Session - 07/16/23 1541     Visit Number 1    Number of Visits 17    Date for SLP Re-Evaluation 09/10/23    Authorization Type Medicare A/Medicare B    Progress Note Due on Visit 10    SLP Start Time 1400    SLP Stop Time  1445    SLP Time Calculation (min) 45 min    Activity Tolerance Patient tolerated treatment well             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   Elevated hemoglobin A1c 08/07/2021   Excessive drinking of alcohol 01/01/2023   01/01/23 - reported 4 hard ciders (4.5% ABV) nightly   Generalized anxiety disorder 06/16/2017   GERD (gastroesophageal reflux disease)    History of hernia repair 05/14/2012   History of kidney stones    Hyperlipidemia, mixed 12/19/2020   Hyponatremia 10/08/2013   Hypothyroidism    Insomnia due to medical condition 06/16/2017   Macular degeneration    Major depressive disorder    Medication overdose 06/16/2017   Mild cognitive impairment of uncertain or unknown etiology 01/01/2023   Nephrolithiasis 01/25/2020   Obstructive sleep apnea 10/07/2013   Osteopenia 04/04/2014   Paresthesia of both feet 12/03/2022   Radiculopathy 02/24/2013   Rising PSA level 08/07/2021   Shortness of breath on exertion 12/19/2020   Tremor 03/19/2022   Past Surgical History:  Procedure Laterality Date   CATARACT EXTRACTION W/ INTRAOCULAR LENS IMPLANT  2011 (APPROX)   RIGHT EYE AND REPAIR DETACHED RETINA   CYSTOSCOPY WITH  RETROGRADE PYELOGRAM, URETEROSCOPY AND STENT PLACEMENT Bilateral 02/23/2021   Procedure: CYSTOSCOPY WITH RETROGRADE PYELOGRAM, URETEROSCOPY AND STENT PLACEMENT;  Surgeon: Sebastian Ache, MD;  Location: [facility];  Service: Urology;  Laterality: Bilateral;  75 MINS   EYE SURGERY     HAMMER TOE SURGERY  2012  &  2010  (APPROX)   ONE TOE , EACH FOOT   HERNIA REPAIR Bilateral    Inguinal Hernia Repair   HOLMIUM LASER APPLICATION Bilateral 02/23/2021   Procedure: HOLMIUM LASER APPLICATION;  Surgeon: Sebastian Ache, MD;  Location: [facility];  Service: Urology;  Laterality: Bilateral;   JOINT REPLACEMENT Left 2014   Partial Knee Replacement   KNEE SURGERY Left    x 2  partial and reconstructive   NASAL TURBINATE REDUCTION Bilateral 09/27/2015   Procedure: TURBINATE REDUCTION/SUBMUCOSAL RESECTION;  Surgeon: Geanie Logan, MD;  Location: [facility];  Service: ENT;  Laterality: Bilateral;   REPAIR RIGHT INGUINAL HERNIA W/ MESH  02-14-2006   SEPTOPLASTY N/A 09/27/2015   Procedure: SEPTOPLASTY;  Surgeon: Geanie Logan, MD;  Location: [facility];  Service: ENT;  Laterality: N/A;   URETEROSCOPY  08/12/2012   Procedure: URETEROSCOPY;  Surgeon: Sebastian Ache, MD;  Location: [facility];  Service: Urology;  Laterality: Left;  1 HR    Patient Active Problem List   Diagnosis Date Noted   Neuropathic pain of both feet 04/15/2023   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   Chronic  pain syndrome 04/15/2023   Mild cognitive impairment 01/01/2023   Excessive drinking of alcohol 01/01/2023   Major depressive disorder 12/31/2022   GERD (gastroesophageal reflux disease) 12/31/2022   Macular degeneration 12/31/2022   Paresthesia of both feet 12/03/2022   Tremor 03/19/2022   Circadian rhythm sleep disorder, delayed sleep phase type 01/21/2022   Bipolar disorder II, in full remission, most recent episode depressed 01/21/2022   High risk medication use 01/21/2022   Elevated hemoglobin A1c  08/07/2021   Rising PSA level 08/07/2021   Hyperlipidemia, mixed 12/19/2020   Benign essential hypertension 12/19/2020   Abnormal ECG 12/19/2020   Shortness of breath on exertion 12/19/2020   Nephrolithiasis 01/25/2020   Medication overdose 06/16/2017   Insomnia due to medical condition 06/16/2017   Generalized anxiety disorder 06/16/2017   Osteopenia 04/04/2014   Hyponatremia 10/08/2013   Obstructive sleep apnea 10/07/2013   Hypothyroidism 10/07/2013   Bilateral sciatica 10/07/2013   Back pain 02/24/2013   Radiculopathy 02/24/2013   History of hernia repair 05/14/2012    ONSET DATE:  03/27/2023; date of referral 07/10/2023  REFERRING DIAG: 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 Laryngoscopy revealed hypomobility of right vocal cord and complete immobility of left vocal cord.   Modified Barium Swallow Study 05/12/2023 No aspiration observed, continue regular diet with thin liquids  06/26/2023 Left hemithyroidectomy  Bedside Swallow Evaluation Recommend continue regular diet with thin liquids, head turn to pt's left to reduce s/s of aspiration at bedside  07/10/2023 Referral received from Dr Geanie Logan Laryngoscopy revealed "left cord as immobile, right cord does move although there may be some limited  abduction."    PAIN:  Are you having pain? No   FALLS: Has patient fallen in last 6 months? No,  LIVING ENVIRONMENT: Lives with: lives with their spouse Lives in: House/apartment  PLOF: Independent  PATIENT GOALS    to swallow safely and improve vocal quality  OBJECTIVE:  COGNITION: Overall cognitive status: Within functional limits for tasks assessed  SOCIAL HISTORY: Occupation: retired Production assistant, radio intake: optimal Caffeine/alcohol intake: minimal Daily voice use: minimal Environmental risks: None reported Occupational risks: None identified Misuse: None observed or reported Phonotraumatic behaviors: Other:N/A  PERCEPTUAL VOICE ASSESSMENT: Voice quality: hoarse, breathy, harsh, rough, strained, low vocal intensity, vocal fatigue, and aphonic Vocal abuse: abnormal breathing pattern and habitual abnormal pitch Resonance: normal Respiratory function: clavicular breathing  OBJECTIVE VOICE ASSESSMENT: Sustained "ah" maximum phonation time: 3 seconds Given aphonic vocal quality - unable to capture true phonation for pitch   ORAL MOTOR EXAMINATION Facial : WFL Lingual: WFL Velum: WFL Mandible: WFL Cough: WFL  CLINICAL SWALLOW ASSESSMENT:   Current diet: regular and thin liquids Dentition: adequate natural dentition Feeding: able to feed self Consistencies tested: Thin Liquid: Presentation: Self-fed from water bottle Oral Phase: WFL Pharyngeal Phase: Impaired: decreased hyolaryngeal movement to palpation, multiple swallows, wet vocal quality, and delayed cough  Evaluation findings: Patient presents with s/sx of (oral, pharyngeal, oropharyngeal) dysphagia characterized by intermittent wet voice and delayed cough.   Aspiration risk factors:Other: confirmed vocal cord paralysis Overall aspiration risk:Minimal Diet Recommendations: regular and thin liquids Precautions:Minimize environmental distractions, Slow rate, Small sips/bites, Seated upright 90  degrees, Remain upright for at least 30 minutes after meals, and Head turn left Supervision: Patient able to feed self Oral care recommendations:Oral care BID Follow-up recommendations: MBSS - scheduled for 07/18/2023    PATIENT REPORTED OUTCOME MEASURES (PROM):  VOICE HANDICAP INDEX (VHI)  The Voice Handicap Index is comprised of a series of questions to assess the patient's perception of their voice. It is designed to evaluate the emotional, physical and functional components of the voice problem.  Functional: 35 Physical: 35 Emotional: 39 Total: 109 (Normal mean 8.75, SD =14.97)  z score =  6.7 severe = 3.00+   TODAY'S TREATMENT:  Skilled treatment session focused on pt's dysphonia. SLP facilitated session by providing the following interventions: Vocal cord adduction activity using downward arm press on arms of chair - unable to obtain phonation (uncertain of effort level) Vocal cord adduction activity using ball to press - pt with active press but no phonation observed Breath hold with phonation - initial phonation heard x 6 attempts   PATIENT EDUCATION: Education details: results of this evaluation, ST POC, upcoming MBSS Person educated: Patient and Spouse Education method: Explanation Education comprehension: needs further education   HOME EXERCISE PROGRAM: N/A     GOALS: Goals reviewed with patient? Yes  SHORT TERM GOALS: Target date: 10 sessions  Patient will participate in objective swallowing evaluation (MBSS) to identify safest diet recommendation as well as therapeutic targets.  Baseline: Goal status: INITIAL  2.  With Mod I, pt will demonstrate understanding of result and recommendations of MBS by verbalizing salient points.   Baseline:  Goal status: INITIAL  3.  The patient will maximize voice quality and loudness using breath support/oral resonance for sustained vowel production, pitch glides, and hierarchal speech drill.   Baseline:  Goal  status: INITIAL  LONG TERM GOALS: Target date: 09/10/2023  Patient will improve perception of swallowing as indicated by an improvement in EAT-10 score by 12 weeks from initial swallowing therapy session.  Baseline:  Goal status: INITIAL  2.  The patient will be independent for abdominal breathing and breath support exercises.   Baseline:  Goal status: INITIAL  3.  The patient will demonstrate independent understanding of vocal hygiene concepts.   Baseline:  Goal status: INITIAL   ASSESSMENT:  CLINICAL IMPRESSION: Patient is a 77 y.o. male who was seen today for a qualitative voice evaluation and clinical swallow evaluation d/t left vocal cord paralysis. Pt presents with severe dysphonia that is c/b aphonia, harsh, low vocal intensity with decreased respiratory support. In addition he presents with s/s concerning for pharyngeal phase dysphagia when consuming thin liquids. Recommend follow up Modified Barium Swallow Study.   OBJECTIVE IMPAIRMENTS include voice disorder and dysphagia. These impairments are limiting patient from effectively communicating at home and in community and safety when swallowing. Factors affecting potential to achieve goals and functional outcome are  pt's self-confessed pessimism . Patient will benefit from skilled SLP services to address above impairments and improve overall function.  REHAB POTENTIAL: Fair severity of impairments; pt's self-confused pessimism  PLAN: SLP FREQUENCY: 1-2x/week  SLP DURATION: 8 weeks  PLANNED INTERVENTIONS: SLP instruction and feedback and Patient/family education    Sigismund Cross B. Dreama Saa, M.S., CCC-SLP, CBIS Speech-Language Pathologist Certified  Brain Injury Specialist [facility]  [facility] Rehabilitation Services Office [phone] Ascom [phone] Fax [phone]

## 2023-07-17 ENCOUNTER — Ambulatory Visit
Admission: RE | Admit: 2023-07-17 | Discharge: 2023-07-17 | Disposition: A | Discharge status: Home / Self Care | Payer: Medicare Other | Source: Ambulatory Visit | Attending: Student in an Organized Health Care Education/Training Program | Admitting: Student in an Organized Health Care Education/Training Program

## 2023-07-17 ENCOUNTER — Ambulatory Visit (HOSPITAL_BASED_OUTPATIENT_CLINIC_OR_DEPARTMENT_OTHER): Payer: Medicare Other | Admitting: Student in an Organized Health Care Education/Training Program

## 2023-07-17 ENCOUNTER — Encounter: Payer: Self-pay | Admitting: Student in an Organized Health Care Education/Training Program

## 2023-07-17 VITALS — BP 135/86 | HR 93 | Temp 97.9°F | Resp 17 | Ht 67.0 in | Wt 160.0 lb

## 2023-07-17 DIAGNOSIS — R531 Weakness: Secondary | ICD-10-CM | POA: Diagnosis not present

## 2023-07-17 DIAGNOSIS — E042 Nontoxic multinodular goiter: Secondary | ICD-10-CM | POA: Diagnosis not present

## 2023-07-17 DIAGNOSIS — G5793 Unspecified mononeuropathy of bilateral lower limbs: Secondary | ICD-10-CM

## 2023-07-17 DIAGNOSIS — G894 Chronic pain syndrome: Secondary | ICD-10-CM

## 2023-07-17 DIAGNOSIS — G608 Other hereditary and idiopathic neuropathies: Secondary | ICD-10-CM

## 2023-07-17 DIAGNOSIS — R1312 Dysphagia, oropharyngeal phase: Secondary | ICD-10-CM | POA: Diagnosis not present

## 2023-07-17 DIAGNOSIS — R059 Cough, unspecified: Secondary | ICD-10-CM | POA: Diagnosis not present

## 2023-07-17 DIAGNOSIS — R633 Feeding difficulties, unspecified: Secondary | ICD-10-CM | POA: Diagnosis present

## 2023-07-17 DIAGNOSIS — J3801 Paralysis of vocal cords and larynx, unilateral: Secondary | ICD-10-CM | POA: Diagnosis not present

## 2023-07-17 DIAGNOSIS — T17928A Food in respiratory tract, part unspecified causing other injury, initial encounter: Secondary | ICD-10-CM | POA: Diagnosis not present

## 2023-07-17 DIAGNOSIS — W44F3XA Food entering into or through a natural orifice, initial encounter: Secondary | ICD-10-CM | POA: Diagnosis not present

## 2023-07-17 DIAGNOSIS — E039 Hypothyroidism, unspecified: Secondary | ICD-10-CM | POA: Diagnosis not present

## 2023-07-17 DIAGNOSIS — R1310 Dysphagia, unspecified: Secondary | ICD-10-CM | POA: Diagnosis present

## 2023-07-17 NOTE — Progress Notes (Signed)
PROVIDER NOTE: Information contained herein reflects review and annotations entered in association with encounter. Interpretation of such information and data should be left to medically-trained personnel. Information provided to patient can be located elsewhere in the medical record under "Patient Instructions". Document created using STT-dictation technology, any transcriptional errors that may result from process are unintentional.    Patient: Adrian Lewis  Service Category: E/M  Provider: Edward Jolly, MD  DOB: Mar 27, 1946  DOS: 07/17/2023  Referring Provider: Dorothey Baseman, MD  MRN: [id number]  Specialty: Interventional Pain Management  PCP: Dorothey Baseman, MD  Type: Established Patient  Setting: Ambulatory outpatient    Location: Office  Delivery: Face-to-face     HPI  Adrian Lewis, a 77 y.o. year old male, is here today because of his Neuropathic pain of both feet [G57.93]. Adrian Lewis primary complain today is Foot Pain (bilateral)  Pertinent problems: Adrian Lewis has Neuropathic pain of both feet; Peripheral sensory-motor axonal polyneuropathy; and Chronic pain syndrome on their pertinent problem list. Pain Assessment: Severity of Chronic pain is reported as a 7 /10. Location: Foot Right, Left/denies but states toes are worse. Onset: More than a month ago. Quality: Aching, Burning. Timing: Constant. Modifying factor(s): accupuncture, red light therapy. Vitals:  height is 5\' 7"  (1.702 m) and weight is 160 lb (72.6 kg). His temporal temperature is 97.9 F (36.6 C). His blood pressure is 135/86 and his pulse is 93. His respiration is 17 and oxygen saturation is 98%.  BMI: Estimated body mass index is 25.06 kg/m as calculated from the following:   Height as of this encounter: 5\' 7"  (1.702 m).   Weight as of this encounter: 160 lb (72.6 kg). Last encounter: 04/15/2023. Last procedure: Visit date not found.  Reason for encounter: patient-requested evaluation.   Patient is here to  discuss and learn more about spinal cord stimulation as a potential treatment options for his bilateral feet paresthesias secondary to chronic sensory axonal polyneuropathy.  I was able to evaluate his interlaminar windows under live fluoroscopy and they appear patent for percutaneous access.  I also had a Lexicographer with him.  I informed him that if he is interested in moving forward, that we will need a thoracic and lumbar MRI as well as psych evaluation.  He endorsed understanding.  He will follow-up as needed.  HPI from initial clinic visit: Patient here for foot pain bilateral (L>R). Pain started December 2023. Pain since has worsen. Past 3 months have tried Acupuncture (once every 2 weeks), foot massager, soft wave, red light therapy, steriod injection to the feet (03/26/23). Most helpful has been acupuncture and steroid injection to the foot.  He is doing acupuncture every 2 weeks and states that he is receiving the greatest benefit with acupuncture.  He is not a diabetic.  His EMG/nerve conduction velocity study shows chronic sensory axonal polyneuropathy affecting the lower extremities as well as absent peroneal responses.  He has chronic L3-L4 radiculopathy and chronic L5 radiculopathy.  He denies any radiating pain from his low back to his legs.  He has tried gabapentin but did not continue due to side effects.  He has also tried alpha lipoic acid. He has a history of bipolar type II which she sees psychiatry for.   Constitutional: Denies any fever or chills Gastrointestinal: No reported hemesis, hematochezia, vomiting, or acute GI distress Musculoskeletal: Denies any acute onset joint swelling, redness, loss of ROM, or weakness Neurological:  Bilateral feet paresthesias  Medication  Review  Coenzyme Q10, Fish Oil, Misc Natural Products, Multiple Vitamins-Minerals, OVER THE COUNTER MEDICATION, Probiotic Product, Quercetin, Resveratrol, SAMe,  Tryptophan, Vitamin K2, b complex vitamins, buPROPion, ergocalciferol, lamoTRIgine, levothyroxine, lithium carbonate, magnesium oxide, potassium citrate, senna-docusate, sodium fluoride, telmisartan, theophylline, vitamin C, and zaleplon  History Review  Allergy: Adrian Lewis is allergic to bee venom, cat hair extract, clonidine, and grass pollen([id number]-swt vern). Drug: Adrian Lewis  reports no history of drug use. Alcohol:  reports current alcohol use of about 28.0 standard drinks of alcohol per week. Tobacco:  reports that he has never smoked. He has never used smokeless tobacco. Social: Adrian Lewis  reports that he has never smoked. He has never used smokeless tobacco. He reports current alcohol use of about 28.0 standard drinks of alcohol per week. He reports that he does not use drugs. 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), Elevated hemoglobin A1c (08/07/2021), Excessive drinking of alcohol (01/01/2023), Generalized anxiety disorder (06/16/2017), GERD (gastroesophageal reflux disease), History of hernia repair (05/14/2012), History of kidney stones, Hyperlipidemia, mixed (12/19/2020), Hyponatremia (10/08/2013), Hypothyroidism, 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 Surgical: Adrian Lewis  has a past surgical history that  includes REPAIR RIGHT INGUINAL HERNIA W/ MESH (02-14-2006); Hammer toe surgery (2012  &  2010  (APPROX)); Cataract extraction w/ intraocular lens implant (2011 (APPROX)); Ureteroscopy (08/12/2012); Knee surgery (Left); Eye surgery; Hernia repair (Bilateral); Joint replacement (Left, 2014); Septoplasty (N/A, 09/27/2015); Nasal turbinate reduction (Bilateral, 09/27/2015); Cystoscopy with retrograde pyelogram, ureteroscopy and stent placement (Bilateral, 02/23/2021); and Holmium laser application (Bilateral, 02/23/2021). Family: family history includes Dementia in his mother; Heart attack in his father; Parkinson's disease in his maternal aunt; Suicidality in his brother and sister.  Laboratory Chemistry Profile   Renal Lab Results  Component Value Date   BUN 18 07/10/2023   CREATININE 0.89 07/10/2023   BCR 20 07/10/2023   GFRAA >60 06/10/2017   GFRNONAA >60 02/15/2021    Hepatic Lab Results  Component Value Date   AST 36 06/10/2017   ALT 34 06/10/2017   ALBUMIN 4.4 10/11/2022   ALKPHOS 61 06/10/2017   AMMONIA 26 06/10/2017    Electrolytes Lab Results  Component Value Date   NA 138 10/11/2022   K 4.5 10/11/2022   CL 103 10/11/2022   CALCIUM 9.5 10/11/2022   PHOS 4.2 (H) 10/11/2022    Bone No results found for: "VD25OH", "VD125OH2TOT", "IO9629BM8", "UX3244WN0", "25OHVITD1", "25OHVITD2", "25OHVITD3", "TESTOFREE", "TESTOSTERONE"  Inflammation (CRP: Acute Phase) (ESR: Chronic Phase) No results found for: "CRP", "ESRSEDRATE", "LATICACIDVEN"       Note: Above Lab results reviewed.  Recent Imaging Review  DG PAIN CLINIC C-ARM 1-60 MIN NO REPORT Fluoro was used, but no Radiologist interpretation will be provided.  Please refer to "NOTES" tab for provider progress note. Note: Reviewed        Physical Exam  General appearance: Well nourished, well developed, and well hydrated. In no apparent acute distress Mental status: Alert, oriented x 3 (person, place, & time)       Respiratory: No  evidence of acute respiratory distress  Eyes: PERLA Vitals: BP 135/86   Pulse 93   Temp 97.9 F (36.6 C) (Temporal)   Resp 17   Ht 5\' 7"  (1.702 m)   Wt 160 lb (72.6 kg)   SpO2 98%   BMI 25.06 kg/m  BMI: Estimated body mass index is 25.06 kg/m as calculated from the following:   Height as of this encounter: 5\' 7"  (1.702 m).   Weight as of this encounter: 160 lb (72.6 kg). Ideal: Ideal body weight: 66.1 kg (145 lb 11.6 oz) Adjusted ideal body weight: 68.7 kg (151 lb 7 oz)  Lower Extremity Exam      Side: Right lower extremity   Side: Left lower extremity  Stability: No instability observed           Stability: No instability observed          Skin & Extremity Inspection: Skin color, temperature, and hair growth are WNL. No peripheral edema or cyanosis. No masses, redness, swelling, asymmetry, or associated skin lesions. No contractures.   Skin & Extremity Inspection: Skin color, temperature, and hair growth are WNL. No peripheral edema or cyanosis. No masses, redness, swelling, asymmetry, or associated skin lesions. No contractures.  Functional ROM: Unrestricted ROM                   Functional ROM: Unrestricted ROM                  Muscle Tone/Strength: Functionally intact. No obvious neuro-muscular anomalies detected.   Muscle Tone/Strength: Functionally intact. No obvious neuro-muscular anomalies detected.  Sensory (Neurological): Neuropathic pain pattern         Sensory (Neurological): Neuropathic pain pattern        DTR: Patellar: deferred today Achilles: deferred today Plantar: deferred today   DTR: Patellar: deferred today Achilles: deferred today Plantar: deferred today  Palpation: No palpable anomalies   Palpation: No palpable anomalies    Assessment   Diagnosis Status  1. Neuropathic pain of both feet   2. Peripheral sensory-motor axonal polyneuropathy   3. Chronic pain syndrome    Persistent Persistent Persistent   Updated Problems: No problems  updated.  Plan of Care  Problem-specific:  No problem-specific Assessment & Plan notes found for this encounter.  Adrian Lewis has a current medication list which includes the following long-term medication(s): bupropion, lamotrigine, levothyroxine, lithium carbonate, theophylline, and zaleplon.  Louise has bilateral neuropathic pain of his feet related to chronic sensory axonal polyneuropathy.  He has tried various medications with limited response.  We discussed spinal cord stimulation today and I informed him that we will need a thoracic and lumbar MRI along with psych eval to proceed.  He states that he will think about this further and let us know if he would like to move forward with this.      Pharmacotherapy (Medications Ordered): No orders of the defined types were placed in this encounter.  Orders:  Orders Placed This Encounter  Procedures   DG PAIN CLINIC C-ARM 1-60 MIN NO REPORT    Intraoperative interpretation by procedural physician at [facility].    Standing Status:   Standing    Number of Occurrences:   1    Order Specific Question:   Reason for exam:    Answer:   Assistance in needle guidance and placement for procedures requiring needle placement in or near specific anatomical locations not easily accessible without such assistance.   Follow-up plan:   Return for patient  will call to schedule F2F appt prn.      Recent Visits No visits were found meeting these conditions. Showing recent visits within past 90 days and meeting all other requirements Today's Visits Date Type Provider Dept  07/17/23 Office Visit Edward Jolly, MD [facility]  Showing today's visits and meeting all other requirements Future Appointments No visits were found meeting these conditions. Showing future appointments within next 90 days and meeting all other requirements  I discussed the assessment and treatment plan with the patient. The patient was  provided an opportunity to ask questions and all were answered. The patient agreed with the plan and demonstrated an understanding of the instructions.  Patient advised to call back or seek an in-person evaluation if the symptoms or condition worsens.  Duration of encounter: .  Total time on encounter, as per AMA guidelines included both the face-to-face and non-face-to-face time personally spent by the physician and/or other qualified health care professional(s) on the day of the encounter (includes time in activities that require the physician or other qualified health care professional and does not include time in activities normally performed by clinical staff). Physician's time may include the following activities when performed: Preparing to see the patient (e.g., pre-charting review of records, searching for previously ordered imaging, lab work, and nerve conduction tests) Review of prior analgesic pharmacotherapies. Reviewing PMP Interpreting ordered tests (e.g., lab work, imaging, nerve conduction tests) Performing post-procedure evaluations, including interpretation of diagnostic procedures Obtaining and/or reviewing separately obtained history Performing a medically appropriate examination and/or evaluation Counseling and educating the patient/family/caregiver Ordering medications, tests, or procedures Referring and communicating with other health care professionals (when not separately reported) Documenting clinical information in the electronic or other health record Independently interpreting results (not separately reported) and communicating results to the patient/ family/caregiver Care coordination (not separately reported)  Note by: Edward Jolly, MD Date: 07/17/2023; Time: 2:10 PM

## 2023-07-17 NOTE — Patient Instructions (Signed)

## 2023-07-18 ENCOUNTER — Ambulatory Visit
Admission: RE | Admit: 2023-07-18 | Discharge: 2023-07-18 | Disposition: A | Discharge status: Home / Self Care | Payer: Medicare Other | Source: Ambulatory Visit | Attending: Otolaryngology | Admitting: Otolaryngology

## 2023-07-18 DIAGNOSIS — R1310 Dysphagia, unspecified: Secondary | ICD-10-CM

## 2023-07-18 DIAGNOSIS — R1312 Dysphagia, oropharyngeal phase: Secondary | ICD-10-CM | POA: Insufficient documentation

## 2023-07-18 DIAGNOSIS — T17928A Food in respiratory tract, part unspecified causing other injury, initial encounter: Secondary | ICD-10-CM | POA: Insufficient documentation

## 2023-07-18 DIAGNOSIS — G608 Other hereditary and idiopathic neuropathies: Secondary | ICD-10-CM | POA: Insufficient documentation

## 2023-07-18 DIAGNOSIS — R059 Cough, unspecified: Secondary | ICD-10-CM | POA: Insufficient documentation

## 2023-07-18 DIAGNOSIS — W44F3XA Food entering into or through a natural orifice, initial encounter: Secondary | ICD-10-CM | POA: Insufficient documentation

## 2023-07-18 DIAGNOSIS — E039 Hypothyroidism, unspecified: Secondary | ICD-10-CM | POA: Insufficient documentation

## 2023-07-18 DIAGNOSIS — R531 Weakness: Secondary | ICD-10-CM | POA: Insufficient documentation

## 2023-07-18 DIAGNOSIS — J3801 Paralysis of vocal cords and larynx, unilateral: Secondary | ICD-10-CM | POA: Insufficient documentation

## 2023-07-18 DIAGNOSIS — R633 Feeding difficulties, unspecified: Secondary | ICD-10-CM | POA: Insufficient documentation

## 2023-07-18 DIAGNOSIS — E042 Nontoxic multinodular goiter: Secondary | ICD-10-CM | POA: Insufficient documentation

## 2023-07-18 DIAGNOSIS — G894 Chronic pain syndrome: Secondary | ICD-10-CM | POA: Insufficient documentation

## 2023-07-18 DIAGNOSIS — G5793 Unspecified mononeuropathy of bilateral lower limbs: Secondary | ICD-10-CM | POA: Insufficient documentation

## 2023-07-18 NOTE — Procedures (Addendum)
Modified Barium Swallow Study  Patient Details  Name: Adrian Lewis MRN: [id number] Date of Birth: 1945-12-11  Today's Date: 07/18/2023  Modified Barium Swallow completed.  Full report located under Chart Review in the Imaging Section.  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   Laryngoscopy revealed hypomobility of right vocal cord and complete immobility of left vocal cord.        Modified Barium Swallow Study 05/12/2023  No aspiration observed, continue regular diet with thin liquids       06/26/2023  Left hemithyroidectomy       Bedside Swallow Evaluation  Recommend continue regular diet with thin liquids, head turn to pt's left to reduce s/s of aspiration at bedside      07/10/2023  Referral received from Dr Geanie Logan  Laryngoscopy revealed "left cord as immobile, right cord does move although there may be some limited abduction."   Clinical Impression Pt presents with moderate oropharyngeal dysphagia that appears to be sensorimotor in nature resulting in sensed and silent aspiration of thin liquids via cup and nectar thick liquids via cup sips. Suspect decreased airway protection is related in part to pt's left vocal cord paralysis and decreased motility of right vocal cord. Previous bedside swallow evaluation while admitted at [facility] recommended left head turn to reduce s/s of aspiration at bedside. Pt states that he was not able to consistently remember to do this and was noted to have some difficulty comprehending details of this evaluation and ST POC concerning dysphonia and dysphagia.       Pt presents with posterior head tilt to help with oral AP transit of  all boluses (thin liquids, nectar thick liquids, honey thick liquids, puree, graham cracker with barium paste and whole tablet with puree). Pt with weak lingual manipulation of all boluses resulting in passive movement of boluses over tongue base and piecemeal swallowing of larger boluses including cup sips of thin liquids, nectar thick liquids, puree and graham cracker with barium paste.    He presents with moderate sensorimotor pharyngeal phase dysphagia that is c/b delayed swallow initiation with boluses resting in the vallecula prior to swallow initiation, with sensed and silent aspiration during cup sips of thin liquids and nectar thick liquids. Pt with decreased pharyngeal strength as mild to moderate vallecular residue was present following the swallows of thin liquids via cup, nectar thick liquids via cup sips, puree and graham cracker with barium paste. Pharyngeal residue in the vallecula was silently aspirated following swallows of thin liquids and nectar thick liquids when presented via cup. Spontaneous and cued cough was ineffective in clearing aspirates.       Current diet recommendation would be regular with thin liquids via spoon, medication whole with puree and thin liquids via spoon. Pt's risk of aspiration and pharyngeal residue is greatly reduced when consuming smaller bolus size with spoon. Extensive education was provided to pt and his wife following this study including video feedback. Emotional support and encouragement provided for hopeful improvement in dysphonia and dysphagia with skilled ST services.  Factors that may increase risk of adverse event in presence of aspiration [name] 2021):  (voice disorder, pt's self-report "pessimism")  Swallow  Evaluation Recommendations Recommendations: PO diet PO Diet Recommendation: Regular;Thin liquids (Level 0) Liquid Administration via: Spoon Medication Administration: Whole meds with puree Supervision: Patient able to  self-feed Swallowing strategies  : Minimize environmental distractions;Slow rate;Small bites/sips Postural changes: Position pt fully upright for meals;Stay upright 30-60 min after meals Oral care recommendations: Oral care BID (2x/day) Recommended consults:  (pt is currently under care of ENT)   [name], M.S., CCC-SLP, Tree surgeon Certified Brain Injury Specialist [facility]  [facility] Office [phone] Ascom [phone] Fax [phone]

## 2023-07-21 ENCOUNTER — Ambulatory Visit: Payer: Medicare Other | Admitting: Speech Pathology

## 2023-07-21 DIAGNOSIS — J3801 Paralysis of vocal cords and larynx, unilateral: Secondary | ICD-10-CM

## 2023-07-21 DIAGNOSIS — R1313 Dysphagia, pharyngeal phase: Secondary | ICD-10-CM

## 2023-07-21 DIAGNOSIS — R490 Dysphonia: Secondary | ICD-10-CM

## 2023-07-21 NOTE — Therapy (Signed)
OUTPATIENT SPEECH LANGUAGE PATHOLOGY  TREATMENT NOTE   Patient Name: Adrian Lewis MRN: [id number] DOB:July 10, 1946, 77 y.o., male Today's Date: 07/21/2023  PCP: Dorothey Baseman, MD REFERRING PROVIDER: Geanie Logan, MD   End of Session - 07/21/23 1447     Visit Number 2    Number of Visits 17    Date for SLP Re-Evaluation 09/10/23    Authorization Type Medicare A/Medicare B    Progress Note Due on Visit 10    SLP Start Time 1445    SLP Stop Time  1530    SLP Time Calculation (min) 45 min    Activity Tolerance Patient tolerated treatment well             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   Elevated hemoglobin A1c 08/07/2021   Excessive drinking of alcohol 01/01/2023   01/01/23 - reported 4 hard ciders (4.5% ABV) nightly   Generalized anxiety disorder 06/16/2017   GERD (gastroesophageal reflux disease)    History of hernia repair 05/14/2012   History of kidney stones    Hyperlipidemia, mixed 12/19/2020   Hyponatremia 10/08/2013   Hypothyroidism    Insomnia due to medical condition 06/16/2017   Macular degeneration    Major depressive disorder    Medication overdose 06/16/2017   Mild cognitive impairment of uncertain or unknown etiology 01/01/2023   Nephrolithiasis 01/25/2020   Obstructive sleep apnea 10/07/2013   Osteopenia 04/04/2014   Paresthesia of both feet 12/03/2022   Radiculopathy 02/24/2013   Rising PSA level 08/07/2021   Shortness of breath on exertion 12/19/2020   Tremor 03/19/2022   Past Surgical History:  Procedure Laterality Date   CATARACT EXTRACTION W/ INTRAOCULAR LENS IMPLANT  2011 (APPROX)   RIGHT EYE AND REPAIR DETACHED RETINA   CYSTOSCOPY WITH RETROGRADE PYELOGRAM,  URETEROSCOPY AND STENT PLACEMENT Bilateral 02/23/2021   Procedure: CYSTOSCOPY WITH RETROGRADE PYELOGRAM, URETEROSCOPY AND STENT PLACEMENT;  Surgeon: Sebastian Ache, MD;  Location: [facility];  Service: Urology;  Laterality: Bilateral;  75 MINS   EYE SURGERY     HAMMER TOE SURGERY  2012  &  2010  (APPROX)   ONE TOE , EACH FOOT   HERNIA REPAIR Bilateral    Inguinal Hernia Repair   HOLMIUM LASER APPLICATION Bilateral 02/23/2021   Procedure: HOLMIUM LASER APPLICATION;  Surgeon: Sebastian Ache, MD;  Location: [facility];  Service: Urology;  Laterality: Bilateral;   JOINT REPLACEMENT Left 2014   Partial Knee Replacement   KNEE SURGERY Left    x 2  partial and reconstructive   NASAL TURBINATE REDUCTION Bilateral 09/27/2015   Procedure: TURBINATE REDUCTION/SUBMUCOSAL RESECTION;  Surgeon: Geanie Logan, MD;  Location: [facility];  Service: ENT;  Laterality: Bilateral;   REPAIR RIGHT INGUINAL HERNIA W/ MESH  02-14-2006   SEPTOPLASTY N/A 09/27/2015   Procedure: SEPTOPLASTY;  Surgeon: Geanie Logan, MD;  Location: [facility];  Service: ENT;  Laterality: N/A;   URETEROSCOPY  08/12/2012   Procedure: URETEROSCOPY;  Surgeon: Sebastian Ache, MD;  Location: [facility];  Service: Urology;  Laterality: Left;  1 HR    Patient Active Problem List   Diagnosis Date Noted   Neuropathic pain of both feet 04/15/2023   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   Chronic pain syndrome  04/15/2023   Mild cognitive impairment 01/01/2023   Excessive drinking of alcohol 01/01/2023   Major depressive disorder 12/31/2022   GERD (gastroesophageal reflux disease) 12/31/2022   Macular degeneration 12/31/2022   Paresthesia of both feet 12/03/2022   Tremor 03/19/2022   Circadian rhythm sleep disorder, delayed sleep phase type 01/21/2022   Bipolar disorder II, in full remission, most recent episode depressed 01/21/2022   High risk medication use 01/21/2022   Elevated hemoglobin A1c 08/07/2021   Rising PSA  level 08/07/2021   Hyperlipidemia, mixed 12/19/2020   Benign essential hypertension 12/19/2020   Abnormal ECG 12/19/2020   Shortness of breath on exertion 12/19/2020   Nephrolithiasis 01/25/2020   Medication overdose 06/16/2017   Insomnia due to medical condition 06/16/2017   Generalized anxiety disorder 06/16/2017   Osteopenia 04/04/2014   Hyponatremia 10/08/2013   Obstructive sleep apnea 10/07/2013   Hypothyroidism 10/07/2013   Bilateral sciatica 10/07/2013   Back pain 02/24/2013   Radiculopathy 02/24/2013   History of hernia repair 05/14/2012    ONSET DATE:  03/27/2023; date of referral 07/10/2023  REFERRING DIAG: 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 Laryngoscopy revealed hypomobility of right vocal cord and complete immobility of left vocal cord.   Modified Barium Swallow Study 05/12/2023 No aspiration observed, continue regular diet with thin liquids  06/26/2023 Left hemithyroidectomy  Bedside Swallow Evaluation Recommend continue regular diet with thin liquids, head turn to pt's left to reduce s/s of aspiration at bedside  07/10/2023 Referral received from Dr Geanie Logan Laryngoscopy revealed "left cord as immobile, right cord does move although there may be some limited abduction."    PAIN:  Are you having pain? No   FALLS: Has patient fallen in last 6 months? No,  LIVING ENVIRONMENT: Lives  with: lives with their spouse Lives in: House/apartment  PLOF: Independent  PATIENT GOALS    to swallow safely and improve vocal quality  SUBJECTIVE STATEMENT: "It is easy drinking from a spoon than I thought" Pt accompanied by: significant other  OBJECTIVE:   TODAY'S TREATMENT:  Skilled treatment session focused on pt's dysphonia. SLP facilitated session by providing the following interventions:  Extensive education provided that Neck range of motion exercises should be done to the point of feeling a GENTLE, TOLERABLE stretch only. Demonstration provided with pt able to imitate for the following stretches:  Head Tilt: Forward and Back - Gently bow your head and try to touch your chin to your chest. Raise your chin back to the starting position. Tilt your head back as far as possible so you are looking up at the ceiling. Return your head to the starting position. - Mod I with model  Head Tilt: Side to Side: Tilt your head to the side, bringing your ear toward your shoulder. Do not raise your shoulder to your ear. Keep your shoulder still. Return your head to the starting position. Mod I with model - very little movement noted in tilt  Head turns: Turn your head to  look over your shoulder. Tilt your chin down and try to touch it to your shoulder. Do not raise your shoulder to your chin. Face forward again -Mod I with model  Phonation utilizing push and pull: verbal, physical model and written instructions provided for sustained phonation using "ah" during push and pull - some initial phonation observed  Breath hold/phonation - phonation of "ah" facilitated thru use of strong breath hold glottal attack - phonation heard during initial phonation after release of breath hold  Pitch Glides - with moderate assistance, pt able to achieve phonation during glide to higher pitch  Expiratory Muscle Strength Trainer (EMST) - minimal assistance to achieve 7 out of 10 with device set at  45cmH2O   PATIENT EDUCATION: Education details: see above Person educated: Patient and Spouse Education method: Explanation Education comprehension: needs further education   HOME EXERCISE PROGRAM: Perform the above 3 times per days 8 times each     GOALS: Goals reviewed with patient? Yes  SHORT TERM GOALS: Target date: 10 sessions  Patient will participate in objective swallowing evaluation (MBSS) to identify safest diet recommendation as well as therapeutic targets.  Baseline: Goal status: INITIAL  2.  With Mod I, pt will demonstrate understanding of result and recommendations of MBS by verbalizing salient points.   Baseline:  Goal status: INITIAL  3.  The patient will maximize voice quality and loudness using breath support/oral resonance for sustained vowel production, pitch glides, and hierarchal speech drill.   Baseline:  Goal status: INITIAL  LONG TERM GOALS: Target date: 09/10/2023  Patient will improve perception of swallowing as indicated by an improvement in EAT-10 score by 12 weeks from initial swallowing therapy session.  Baseline:  Goal status: INITIAL  2.  The patient will be independent for abdominal breathing and breath support exercises.   Baseline:  Goal status: INITIAL  3.  The patient will demonstrate independent understanding of vocal hygiene concepts.   Baseline:  Goal status: INITIAL   ASSESSMENT:  CLINICAL IMPRESSION: Patient is a 77 y.o. male who was seen today for behavioral voice therapy.  Pt presents with severe dysphonia that is c/b aphonia, harsh, low vocal intensity with decreased respiratory support. In addition he presents with s/s concerning for pharyngeal phase dysphagia when consuming thin liquids. Modified Barium Swallow Study performed on 07/18/2023 with recommendation for thin liquids via spoon. Pt able to perform the activities with written instructions provided. See the above treatment note for details.   OBJECTIVE  IMPAIRMENTS include voice disorder and dysphagia. These impairments are limiting patient from effectively communicating at home and in community and safety when swallowing. Factors affecting potential to achieve goals and functional outcome are  pt's self-confessed pessimism . Patient will benefit from skilled SLP services to address above impairments and improve overall function.  REHAB POTENTIAL: Fair severity of impairments; pt's self-confused pessimism  PLAN: SLP FREQUENCY: 1-2x/week  SLP DURATION: 8 weeks  PLANNED INTERVENTIONS: SLP instruction and feedback and Patient/family education    Yarielys Beed B. Dreama Saa, M.S., CCC-SLP, Tree surgeon Certified Brain Injury Specialist [facility]  [facility] Rehabilitation Services Office [phone] Ascom [phone] Fax [phone]

## 2023-07-23 ENCOUNTER — Ambulatory Visit: Payer: Medicare Other | Admitting: Speech Pathology

## 2023-07-23 DIAGNOSIS — J3801 Paralysis of vocal cords and larynx, unilateral: Secondary | ICD-10-CM

## 2023-07-23 DIAGNOSIS — R1313 Dysphagia, pharyngeal phase: Secondary | ICD-10-CM

## 2023-07-23 DIAGNOSIS — R490 Dysphonia: Secondary | ICD-10-CM | POA: Diagnosis not present

## 2023-07-23 NOTE — Therapy (Signed)
OUTPATIENT SPEECH LANGUAGE PATHOLOGY  TREATMENT NOTE   Patient Name: Adrian Lewis MRN: [id number] DOB:May 21, 1946, 77 y.o., male Today's Date: 07/23/2023  PCP: Dorothey Baseman, MD REFERRING PROVIDER: Geanie Logan, MD   End of Session - 07/23/23 1408     Visit Number 3    Number of Visits 17    Date for SLP Re-Evaluation 09/10/23    Authorization Type Medicare A/Medicare B    Progress Note Due on Visit 10    SLP Start Time 1400    SLP Stop Time  1445    SLP Time Calculation (min) 45 min    Activity Tolerance Patient tolerated treatment well             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   Elevated hemoglobin A1c 08/07/2021   Excessive drinking of alcohol 01/01/2023   01/01/23 - reported 4 hard ciders (4.5% ABV) nightly   Generalized anxiety disorder 06/16/2017   GERD (gastroesophageal reflux disease)    History of hernia repair 05/14/2012   History of kidney stones    Hyperlipidemia, mixed 12/19/2020   Hyponatremia 10/08/2013   Hypothyroidism    Insomnia due to medical condition 06/16/2017   Macular degeneration    Major depressive disorder    Medication overdose 06/16/2017   Mild cognitive impairment of uncertain or unknown etiology 01/01/2023   Nephrolithiasis 01/25/2020   Obstructive sleep apnea 10/07/2013   Osteopenia 04/04/2014   Paresthesia of both feet 12/03/2022   Radiculopathy 02/24/2013   Rising PSA level 08/07/2021   Shortness of breath on exertion 12/19/2020   Tremor 03/19/2022   Past Surgical History:  Procedure Laterality Date   CATARACT EXTRACTION W/ INTRAOCULAR LENS IMPLANT  2011 (APPROX)   RIGHT EYE AND REPAIR DETACHED RETINA   CYSTOSCOPY WITH RETROGRADE PYELOGRAM,  URETEROSCOPY AND STENT PLACEMENT Bilateral 02/23/2021   Procedure: CYSTOSCOPY WITH RETROGRADE PYELOGRAM, URETEROSCOPY AND STENT PLACEMENT;  Surgeon: Sebastian Ache, MD;  Location: [facility];  Service: Urology;  Laterality: Bilateral;  75 MINS   EYE SURGERY     HAMMER TOE SURGERY  2012  &  2010  (APPROX)   ONE TOE , EACH FOOT   HERNIA REPAIR Bilateral    Inguinal Hernia Repair   HOLMIUM LASER APPLICATION Bilateral 02/23/2021   Procedure: HOLMIUM LASER APPLICATION;  Surgeon: Sebastian Ache, MD;  Location: [facility];  Service: Urology;  Laterality: Bilateral;   JOINT REPLACEMENT Left 2014   Partial Knee Replacement   KNEE SURGERY Left    x 2  partial and reconstructive   NASAL TURBINATE REDUCTION Bilateral 09/27/2015   Procedure: TURBINATE REDUCTION/SUBMUCOSAL RESECTION;  Surgeon: Geanie Logan, MD;  Location: [facility];  Service: ENT;  Laterality: Bilateral;   REPAIR RIGHT INGUINAL HERNIA W/ MESH  02-14-2006   SEPTOPLASTY N/A 09/27/2015   Procedure: SEPTOPLASTY;  Surgeon: Geanie Logan, MD;  Location: [facility];  Service: ENT;  Laterality: N/A;   URETEROSCOPY  08/12/2012   Procedure: URETEROSCOPY;  Surgeon: Sebastian Ache, MD;  Location: [facility];  Service: Urology;  Laterality: Left;  1 HR    Patient Active Problem List   Diagnosis Date Noted   Neuropathic pain of both feet 04/15/2023   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   Chronic pain syndrome  04/15/2023   Mild cognitive impairment 01/01/2023   Excessive drinking of alcohol 01/01/2023   Major depressive disorder 12/31/2022   GERD (gastroesophageal reflux disease) 12/31/2022   Macular degeneration 12/31/2022   Paresthesia of both feet 12/03/2022   Tremor 03/19/2022   Circadian rhythm sleep disorder, delayed sleep phase type 01/21/2022   Bipolar disorder II, in full remission, most recent episode depressed 01/21/2022   High risk medication use 01/21/2022   Elevated hemoglobin A1c 08/07/2021   Rising PSA  level 08/07/2021   Hyperlipidemia, mixed 12/19/2020   Benign essential hypertension 12/19/2020   Abnormal ECG 12/19/2020   Shortness of breath on exertion 12/19/2020   Nephrolithiasis 01/25/2020   Medication overdose 06/16/2017   Insomnia due to medical condition 06/16/2017   Generalized anxiety disorder 06/16/2017   Osteopenia 04/04/2014   Hyponatremia 10/08/2013   Obstructive sleep apnea 10/07/2013   Hypothyroidism 10/07/2013   Bilateral sciatica 10/07/2013   Back pain 02/24/2013   Radiculopathy 02/24/2013   History of hernia repair 05/14/2012    ONSET DATE:  03/27/2023; date of referral 07/10/2023  REFERRING DIAG: 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 Laryngoscopy revealed hypomobility of right vocal cord and complete immobility of left vocal cord.   Modified Barium Swallow Study 05/12/2023 No aspiration observed, continue regular diet with thin liquids  06/26/2023 Left hemithyroidectomy  Bedside Swallow Evaluation Recommend continue regular diet with thin liquids, head turn to pt's left to reduce s/s of aspiration at bedside  07/10/2023 Referral received from Dr Geanie Logan Laryngoscopy revealed "left cord as immobile, right cord does move although there may be some limited abduction."    PAIN:  Are you having pain? No   FALLS: Has patient fallen in last 6 months? No,  LIVING ENVIRONMENT: Lives  with: lives with their spouse Lives in: House/apartment  PLOF: Independent  PATIENT GOALS    to swallow safely and improve vocal quality  SUBJECTIVE STATEMENT: Pt reports that he has been doing all of the exercises "together" but not "together" with his wife  Pt accompanied by: significant other  OBJECTIVE:   TODAY'S TREATMENT:  Skilled treatment session focused on pt's dysphonia. SLP facilitated session by providing the following interventions:  Extensive education provided that Neck range of motion exercises should be done to the point of feeling a GENTLE, TOLERABLE stretch only. Demonstration provided with pt able to imitate for the following stretches:  Head Tilt: Forward and Back - Gently bow your head and try to touch your chin to your chest. Raise your chin back to the starting position. Tilt your head back as far as possible so you are looking up at the ceiling. Return your head to the starting position. - Mod I with model  Head Tilt: Side to Side: Tilt your head to the side, bringing your ear toward your shoulder. Do not raise your shoulder to your ear. Keep your shoulder still. Return your head to the starting position. Mod A with model - very little movement noted  in tilt  Head turns: Turn your head to look over your shoulder. Tilt your chin down and try to touch it to your shoulder. Do not raise your shoulder to your chin. Face forward again -Mod I with model  Phonation utilizing push and pull: verbal, physical model and written instructions provided for sustained phonation using "ah" during push and pull - some initial phonation observed  Breath hold/phonation - maximal to moderate for sequence of activity for phonation of "ah" facilitated thru use of strong breath hold glottal attack - phonation heard during initial phonation after release of breath hold pt able to progress to achieve phonation when counting 1-10, saying day of week and DOWs - Maximal verbal cues for "Adrian breath  in, loud voice"  Pitch Glides - pt unable to glide pitches during today's session d/t starting phonation at higher pitch  Expiratory Muscle Strength Trainer (EMST) - maximal assistance faded to moderate assistance for quick bursts of air to achieve 7 out of 10 with device set at 45cmH2O   PATIENT EDUCATION: Education details: see above Person educated: Patient and Spouse Education method: Explanation Education comprehension: needs further education   HOME EXERCISE PROGRAM: Perform the above 3 times per days 8 times each     GOALS: Goals reviewed with patient? Yes  SHORT TERM GOALS: Target date: 10 sessions  Patient will participate in objective swallowing evaluation (MBSS) to identify safest diet recommendation as well as therapeutic targets.  Baseline: Goal status: INITIAL  2.  With Mod I, pt will demonstrate understanding of result and recommendations of MBS by verbalizing salient points.   Baseline:  Goal status: INITIAL  3.  The patient will maximize voice quality and loudness using breath support/oral resonance for sustained vowel production, pitch glides, and hierarchal speech drill.   Baseline:  Goal status: INITIAL  LONG TERM GOALS: Target date: 09/10/2023  Patient will improve perception of swallowing as indicated by an improvement in EAT-10 score by 12 weeks from initial swallowing therapy session.  Baseline:  Goal status: INITIAL  2.  The patient will be independent for abdominal breathing and breath support exercises.   Baseline:  Goal status: INITIAL  3.  The patient will demonstrate independent understanding of vocal hygiene concepts.   Baseline:  Goal status: INITIAL   ASSESSMENT:  CLINICAL IMPRESSION: Patient is a 77 y.o. male who was seen today for behavioral voice therapy.  Pt presents with severe dysphonia that is c/b aphonia, harsh, low vocal intensity with decreased respiratory support. In addition he presents with s/s concerning for  pharyngeal phase dysphagia when consuming thin liquids. Modified Barium Swallow Study performed on 07/18/2023 with recommendation for thin liquids via spoon. Pt and his wife report decreased coughing when using a spoon and when taking cup sips (when pt forgets to use spoon). Education provided on silent aspiration. Throughout session, pt with negative responses to therapy activities. For example, when cued for louder volume, pt stated "I can't do that" and during phonation tasks using his name he stated "I don't like saying my name." He required extensive cues to identify moments of increased phonation with report of increased hoarseness after performing activities. Education provided on no perceptual increase in hoarseness or breathiness. Also recommend that pt and his wife perform HEP together given difficulty with pt recall/performance of HEP. See the above treatment note for details.   OBJECTIVE IMPAIRMENTS include voice disorder and dysphagia. These impairments are limiting patient from effectively communicating at home and in community and safety when  swallowing. Factors affecting potential to achieve goals and functional outcome are  pt's self-confessed pessimism . Patient will benefit from skilled SLP services to address above impairments and improve overall function.  REHAB POTENTIAL: Fair severity of impairments; pt's self-confused pessimism  PLAN: SLP FREQUENCY: 1-2x/week  SLP DURATION: 8 weeks  PLANNED INTERVENTIONS: SLP instruction and feedback and Patient/family education    Windle Huebert B. Dreama Saa, M.S., CCC-SLP, Tree surgeon Certified Brain Injury Specialist [facility]  [facility] Rehabilitation Services Office [phone] Ascom [phone] Fax [phone]

## 2023-07-29 ENCOUNTER — Ambulatory Visit: Payer: Medicare Other | Admitting: Speech Pathology

## 2023-07-30 ENCOUNTER — Ambulatory Visit: Payer: Medicare Other | Admitting: Podiatry

## 2023-07-31 ENCOUNTER — Ambulatory Visit: Payer: Medicare Other | Attending: Otolaryngology | Admitting: Speech Pathology

## 2023-07-31 DIAGNOSIS — J3801 Paralysis of vocal cords and larynx, unilateral: Secondary | ICD-10-CM

## 2023-07-31 DIAGNOSIS — R490 Dysphonia: Secondary | ICD-10-CM

## 2023-07-31 DIAGNOSIS — R1313 Dysphagia, pharyngeal phase: Secondary | ICD-10-CM | POA: Diagnosis present

## 2023-07-31 NOTE — Therapy (Signed)
OUTPATIENT SPEECH LANGUAGE PATHOLOGY  TREATMENT NOTE   Patient Name: Adrian Lewis MRN: [id number] DOB:[birth year], 77 y.o., male Today's Date: 07/31/2023  PCP: Dorothey Baseman, MD REFERRING PROVIDER: Geanie Logan, MD   End of Session - 07/31/23 1547     Visit Number 4    Number of Visits 17    Date for SLP Re-Evaluation 09/10/23    Authorization Type Medicare A/Medicare B    Progress Note Due on Visit 10    SLP Start Time 1445    SLP Stop Time  1530    SLP Time Calculation (min) 45 min    Activity Tolerance Patient tolerated treatment well             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   Elevated hemoglobin A1c 08/07/2021   Excessive drinking of alcohol 01/01/2023   01/01/23 - reported 4 hard ciders (4.5% ABV) nightly   Generalized anxiety disorder 06/16/2017   GERD (gastroesophageal reflux disease)    History of hernia repair 05/14/2012   History of kidney stones    Hyperlipidemia, mixed 12/19/2020   Hyponatremia 10/08/2013   Hypothyroidism    Insomnia due to medical condition 06/16/2017   Macular degeneration    Major depressive disorder    Medication overdose 06/16/2017   Mild cognitive impairment of uncertain or unknown etiology 01/01/2023   Nephrolithiasis 01/25/2020   Obstructive sleep apnea 10/07/2013   Osteopenia 04/04/2014   Paresthesia of both feet 12/03/2022   Radiculopathy 02/24/2013   Rising PSA level 08/07/2021   Shortness of breath on exertion 12/19/2020   Tremor 03/19/2022   Past Surgical History:  Procedure Laterality Date   CATARACT EXTRACTION W/ INTRAOCULAR LENS IMPLANT  2011 (APPROX)   RIGHT EYE AND REPAIR DETACHED RETINA   CYSTOSCOPY WITH RETROGRADE PYELOGRAM,  URETEROSCOPY AND STENT PLACEMENT Bilateral 02/23/2021   Procedure: CYSTOSCOPY WITH RETROGRADE PYELOGRAM, URETEROSCOPY AND STENT PLACEMENT;  Surgeon: Sebastian Ache, MD;  Location: [facility];  Service: Urology;  Laterality: Bilateral;  75 MINS   EYE SURGERY     HAMMER TOE SURGERY  2012  &  2010  (APPROX)   ONE TOE , EACH FOOT   HERNIA REPAIR Bilateral    Inguinal Hernia Repair   HOLMIUM LASER APPLICATION Bilateral 02/23/2021   Procedure: HOLMIUM LASER APPLICATION;  Surgeon: Sebastian Ache, MD;  Location: [facility];  Service: Urology;  Laterality: Bilateral;   JOINT REPLACEMENT Left 2014   Partial Knee Replacement   KNEE SURGERY Left    x 2  partial and reconstructive   NASAL TURBINATE REDUCTION Bilateral 09/27/2015   Procedure: TURBINATE REDUCTION/SUBMUCOSAL RESECTION;  Surgeon: Geanie Logan, MD;  Location: [facility];  Service: ENT;  Laterality: Bilateral;   REPAIR RIGHT INGUINAL HERNIA W/ MESH  02-14-2006   SEPTOPLASTY N/A 09/27/2015   Procedure: SEPTOPLASTY;  Surgeon: Geanie Logan, MD;  Location: [facility];  Service: ENT;  Laterality: N/A;   URETEROSCOPY  08/12/2012   Procedure: URETEROSCOPY;  Surgeon: Sebastian Ache, MD;  Location: [facility];  Service: Urology;  Laterality: Left;  1 HR    Patient Active Problem List   Diagnosis Date Noted   Neuropathic pain of both feet 04/15/2023   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   Chronic pain syndrome  04/15/2023   Mild cognitive impairment 01/01/2023   Excessive drinking of alcohol 01/01/2023   Major depressive disorder 12/31/2022   GERD (gastroesophageal reflux disease) 12/31/2022   Macular degeneration 12/31/2022   Paresthesia of both feet 12/03/2022   Tremor 03/19/2022   Circadian rhythm sleep disorder, delayed sleep phase type 01/21/2022   Bipolar disorder II, in full remission, most recent episode depressed 01/21/2022   High risk medication use 01/21/2022   Elevated hemoglobin A1c 08/07/2021   Rising PSA  level 08/07/2021   Hyperlipidemia, mixed 12/19/2020   Benign essential hypertension 12/19/2020   Abnormal ECG 12/19/2020   Shortness of breath on exertion 12/19/2020   Nephrolithiasis 01/25/2020   Medication overdose 06/16/2017   Insomnia due to medical condition 06/16/2017   Generalized anxiety disorder 06/16/2017   Osteopenia 04/04/2014   Hyponatremia 10/08/2013   Obstructive sleep apnea 10/07/2013   Hypothyroidism 10/07/2013   Bilateral sciatica 10/07/2013   Back pain 02/24/2013   Radiculopathy 02/24/2013   History of hernia repair 05/14/2012    ONSET DATE:  03/27/2023; date of referral 07/10/2023  REFERRING DIAG: 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 Laryngoscopy revealed hypomobility of right vocal cord and complete immobility of left vocal cord.   Modified Barium Swallow Study 05/12/2023 No aspiration observed, continue regular diet with thin liquids  06/26/2023 Left hemithyroidectomy  Bedside Swallow Evaluation Recommend continue regular diet with thin liquids, head turn to pt's left to reduce s/s of aspiration at bedside  07/10/2023 Referral received from Dr Geanie Logan Laryngoscopy revealed "left cord as immobile, right cord does move although there may be some limited abduction."    PAIN:  Are you having pain? No   FALLS: Has patient fallen in last 6 months? No,  LIVING ENVIRONMENT: Lives  with: lives with their spouse Lives in: House/apartment  PLOF: Independent  PATIENT GOALS    to swallow safely and improve vocal quality  SUBJECTIVE STATEMENT: Pt reports soreness in his neck from doing the stretches Pt accompanied by: significant other  OBJECTIVE:   TODAY'S TREATMENT:  Skilled treatment session focused on pt's dysphonia. SLP facilitated session by providing the following interventions:  Pt reported increased soreness in his neck from performing the neck stretches. Education provided on discontinuing the exercises until improved.   Phonation utilizing push and pull: verbal, physical model and written instructions provided for sustained phonation using "ah" during push and pull - improved quality and durance of phonation observed - Pitch 175 Hz, 4.6 seconds; continue breathy and hoarse quality observed but improved with cues to project his voice and attempt to make it louder  Breath hold/phonation - maximal to moderate for sequence of activity for phonation of "ah" facilitated thru use of wording "stomach punch, ah" - phonation heard during initial phonation after release of breath hold pt able to progress to achieve phonation when counting 1-10, saying day of week and DOWs - Maximal verbal cues for "deep breath in, loud voice" as pt commented "I can't do that" -  audio feedback was helpful in improving pt's response to exercises  Pitch Glides - while pt was able to demonstrate some improved pitch glides, these didn't appear to help increase phonation  Expiratory Muscle Strength Trainer (EMST) - moderate assistance for quick bursts of air to achieve 7 out of 10 with device increased to 60 cmH2O, pt with difficulty sequencing task and his response to activity was "I can't do that" but with encouragement, pt able to achieve   Skilled observation was also provided of pt consuming thin liquids via bottle. No observed s/s of aspiration noted with single small sips - pt with  confirmed silent aspiration of thin liquids via cup sips on most recent MBSS.    PATIENT EDUCATION: Education details: see above Person educated: Patient and Spouse Education method: Explanation Education comprehension: needs further education   HOME EXERCISE PROGRAM: Perform the above 3 times per days 8 times each  GOALS: Goals reviewed with patient? Yes  SHORT TERM GOALS: Target date: 10 sessions  Patient will participate in objective swallowing evaluation (MBSS) to identify safest diet recommendation as well as therapeutic targets.  Baseline: Goal status: INITIAL  2.  With Mod I, pt will demonstrate understanding of result and recommendations of MBS by verbalizing salient points.   Baseline:  Goal status: INITIAL  3.  The patient will maximize voice quality and loudness using breath support/oral resonance for sustained vowel production, pitch glides, and hierarchal speech drill.   Baseline:  Goal status: INITIAL  LONG TERM GOALS: Target date: 09/10/2023  Patient will improve perception of swallowing as indicated by an improvement in EAT-10 score by 12 weeks from initial swallowing therapy session.  Baseline:  Goal status: INITIAL  2.  The patient will be independent for abdominal breathing and breath support exercises.   Baseline:  Goal status: INITIAL  3.  The patient will demonstrate independent understanding of vocal hygiene concepts.   Baseline:  Goal status: INITIAL   ASSESSMENT:  CLINICAL IMPRESSION: Patient is a 77 y.o. male who was seen today for behavioral voice therapy.  Pt presents with severe dysphonia that is c/b aphonia, harsh, low vocal intensity with decreased respiratory support. In addition he presents with s/s concerning for pharyngeal phase dysphagia when consuming thin liquids. Modified Barium Swallow Study performed on 07/18/2023 with recommendation for thin liquids via spoon. Pt continues with improved phonation when performing the above  tasks despite requiring maximal to moderate assistance d/t his mild cognitive impairment. His wife is present and aids with performing HEP effectively.  See the above treatment note for details.   OBJECTIVE IMPAIRMENTS include voice disorder and dysphagia. These impairments are limiting patient from effectively communicating at home and in community and safety when swallowing. Factors affecting potential to achieve goals and functional outcome are  pt's self-confessed pessimism . Patient will benefit from skilled SLP services to address above impairments and improve overall function.  REHAB POTENTIAL: Fair severity of impairments; pt's self-confused pessimism  PLAN: SLP FREQUENCY: 1-2x/week  SLP DURATION: 8 weeks  PLANNED INTERVENTIONS: SLP instruction and feedback and Patient/family education    Gaby Harney B. Dreama Saa, M.S., CCC-SLP, Tree surgeon Certified Brain Injury Specialist [facility]  [facility] Rehabilitation Services Office [phone] Ascom [phone] Fax [phone]

## 2023-08-04 ENCOUNTER — Ambulatory Visit: Payer: Medicare Other | Admitting: Speech Pathology

## 2023-08-05 ENCOUNTER — Ambulatory Visit: Payer: Medicare Other | Admitting: Speech Pathology

## 2023-08-05 ENCOUNTER — Ambulatory Visit (INDEPENDENT_AMBULATORY_CARE_PROVIDER_SITE_OTHER): Payer: Medicare Other | Admitting: Physician Assistant

## 2023-08-05 ENCOUNTER — Encounter: Payer: Self-pay | Admitting: Physician Assistant

## 2023-08-05 VITALS — BP 120/74 | HR 88 | Resp 18 | Ht 67.0 in | Wt 165.0 lb

## 2023-08-05 DIAGNOSIS — R490 Dysphonia: Secondary | ICD-10-CM | POA: Diagnosis not present

## 2023-08-05 DIAGNOSIS — M5417 Radiculopathy, lumbosacral region: Secondary | ICD-10-CM | POA: Diagnosis not present

## 2023-08-05 DIAGNOSIS — J3801 Paralysis of vocal cords and larynx, unilateral: Secondary | ICD-10-CM

## 2023-08-05 DIAGNOSIS — R4189 Other symptoms and signs involving cognitive functions and awareness: Secondary | ICD-10-CM | POA: Diagnosis not present

## 2023-08-05 DIAGNOSIS — G629 Polyneuropathy, unspecified: Secondary | ICD-10-CM | POA: Diagnosis not present

## 2023-08-05 NOTE — Therapy (Signed)
OUTPATIENT SPEECH LANGUAGE PATHOLOGY  TREATMENT NOTE   Patient Name: Adrian Lewis MRN: [id number] DOB:1946-10-07, 77 y.o., male Today's Date: 08/05/2023  PCP: Dorothey Baseman, MD REFERRING PROVIDER: Geanie Logan, MD   End of Session - 08/05/23 1529     Visit Number 5    Number of Visits 17    Date for SLP Re-Evaluation 09/10/23    Authorization Type Medicare A/Medicare B    Progress Note Due on Visit 10    SLP Start Time 1530    SLP Stop Time  1615    SLP Time Calculation (min) 45 min    Activity Tolerance Patient tolerated treatment well             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   Elevated hemoglobin A1c 08/07/2021   Excessive drinking of alcohol 01/01/2023   01/01/23 - reported 4 hard ciders (4.5% ABV) nightly   Generalized anxiety disorder 06/16/2017   GERD (gastroesophageal reflux disease)    History of hernia repair 05/14/2012   History of kidney stones    Hyperlipidemia, mixed 12/19/2020   Hyponatremia 10/08/2013   Hypothyroidism    Insomnia due to medical condition 06/16/2017   Macular degeneration    Major depressive disorder    Medication overdose 06/16/2017   Mild cognitive impairment of uncertain or unknown etiology 01/01/2023   Nephrolithiasis 01/25/2020   Obstructive sleep apnea 10/07/2013   Osteopenia 04/04/2014   Paresthesia of both feet 12/03/2022   Radiculopathy 02/24/2013   Rising PSA level 08/07/2021   Shortness of breath on exertion 12/19/2020   Tremor 03/19/2022   Past Surgical History:  Procedure Laterality Date   CATARACT EXTRACTION W/ INTRAOCULAR LENS IMPLANT  2011 (APPROX)   RIGHT EYE AND REPAIR DETACHED RETINA   CYSTOSCOPY WITH RETROGRADE PYELOGRAM,  URETEROSCOPY AND STENT PLACEMENT Bilateral 02/23/2021   Procedure: CYSTOSCOPY WITH RETROGRADE PYELOGRAM, URETEROSCOPY AND STENT PLACEMENT;  Surgeon: Sebastian Ache, MD;  Location: [facility];  Service: Urology;  Laterality: Bilateral;  75 MINS   EYE SURGERY     HAMMER TOE SURGERY  2012  &  2010  (APPROX)   ONE TOE , EACH FOOT   HERNIA REPAIR Bilateral    Inguinal Hernia Repair   HOLMIUM LASER APPLICATION Bilateral 02/23/2021   Procedure: HOLMIUM LASER APPLICATION;  Surgeon: Sebastian Ache, MD;  Location: [facility];  Service: Urology;  Laterality: Bilateral;   JOINT REPLACEMENT Left 2014   Partial Knee Replacement   KNEE SURGERY Left    x 2  partial and reconstructive   NASAL TURBINATE REDUCTION Bilateral 09/27/2015   Procedure: TURBINATE REDUCTION/SUBMUCOSAL RESECTION;  Surgeon: Geanie Logan, MD;  Location: [facility];  Service: ENT;  Laterality: Bilateral;   REPAIR RIGHT INGUINAL HERNIA W/ MESH  02-14-2006   SEPTOPLASTY N/A 09/27/2015   Procedure: SEPTOPLASTY;  Surgeon: Geanie Logan, MD;  Location: [facility];  Service: ENT;  Laterality: N/A;   URETEROSCOPY  08/12/2012   Procedure: URETEROSCOPY;  Surgeon: Sebastian Ache, MD;  Location: [facility];  Service: Urology;  Laterality: Left;  1 HR    Patient Active Problem List   Diagnosis Date Noted   Neuropathic pain of both feet 04/15/2023   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   Chronic pain syndrome  04/15/2023   Mild cognitive impairment 01/01/2023   Excessive drinking of alcohol 01/01/2023   Major depressive disorder 12/31/2022   GERD (gastroesophageal reflux disease) 12/31/2022   Macular degeneration 12/31/2022   Paresthesia of both feet 12/03/2022   Tremor 03/19/2022   Circadian rhythm sleep disorder, delayed sleep phase type 01/21/2022   Bipolar disorder II, in full remission, most recent episode depressed 01/21/2022   High risk medication use 01/21/2022   Elevated hemoglobin A1c 08/07/2021   Rising PSA  level 08/07/2021   Hyperlipidemia, mixed 12/19/2020   Benign essential hypertension 12/19/2020   Abnormal ECG 12/19/2020   Shortness of breath on exertion 12/19/2020   Nephrolithiasis 01/25/2020   Medication overdose 06/16/2017   Insomnia due to medical condition 06/16/2017   Generalized anxiety disorder 06/16/2017   Osteopenia 04/04/2014   Hyponatremia 10/08/2013   Obstructive sleep apnea 10/07/2013   Hypothyroidism 10/07/2013   Bilateral sciatica 10/07/2013   Back pain 02/24/2013   Radiculopathy 02/24/2013   History of hernia repair 05/14/2012    ONSET DATE:  03/27/2023; date of referral 07/10/2023  REFERRING DIAG: 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 Laryngoscopy revealed hypomobility of right vocal cord and complete immobility of left vocal cord.   Modified Barium Swallow Study 05/12/2023 No aspiration observed, continue regular diet with thin liquids  06/26/2023 Left hemithyroidectomy  Bedside Swallow Evaluation Recommend continue regular diet with thin liquids, head turn to pt's left to reduce s/s of aspiration at bedside  07/10/2023 Referral received from Dr Geanie Logan Laryngoscopy revealed "left cord as immobile, right cord does move although there may be some limited abduction."    PAIN:  Are you having pain? No   FALLS: Has patient fallen in last 6 months? No,  LIVING ENVIRONMENT: Lives with: lives with their  spouse Lives in: House/apartment  PLOF: Independent  PATIENT GOALS    to swallow safely and improve vocal quality  SUBJECTIVE STATEMENT: Pt smiling at different points throughout the session today Pt accompanied by: significant other  OBJECTIVE:   TODAY'S TREATMENT:  Skilled treatment session focused on pt's dysphonia. SLP facilitated session by providing the following interventions:  Pt continues to report difficulty with neck stretches, continue to support discontinuing stretches at this time.   Phonation utilizing push and pull: rare Min A provided for sustained phonation using "ah" during push and pull - improved quality and durance of phonation observed - Pitch 175 Hz, 6 seconds; much improved phonation perceptually with pt commenting "that sounds more like my voice"   Breath hold/phonation - Rare Min A for sequence of activity for phonation of "ah" - prolonged phonation heard with improved quality throughout phonation observed!   Expiratory Muscle Strength Trainer (EMST) - maximal assistance for slow rate and quick bursts of air to achieve 7 out of 10 with device increased to 75 cmH2O, pt with difficulty sequencing task   Skilled observation was also provided of pt consuming thin liquids via bottle. No observed s/s of aspiration noted with single small sips -  pt with confirmed silent aspiration of thin liquids via cup sips on most recent MBSS.    PATIENT EDUCATION: Education details: see above Person educated: Patient and Spouse Education method: Explanation Education comprehension: needs further education   HOME EXERCISE PROGRAM: Perform the above 3 times per days 8 times each  GOALS: Goals reviewed with patient? Yes  SHORT TERM GOALS: Target date: 10 sessions  Patient will participate in objective swallowing evaluation (MBSS) to identify safest diet recommendation as well as therapeutic targets.  Baseline: Goal status: INITIAL  2.  With Mod I, pt will demonstrate  understanding of result and recommendations of MBS by verbalizing salient points.   Baseline:  Goal status: INITIAL  3.  The patient will maximize voice quality and loudness using breath support/oral resonance for sustained vowel production, pitch glides, and hierarchal speech drill.   Baseline:  Goal status: INITIAL  LONG TERM GOALS: Target date: 09/10/2023  Patient will improve perception of swallowing as indicated by an improvement in EAT-10 score by 12 weeks from initial swallowing therapy session.  Baseline:  Goal status: INITIAL  2.  The patient will be independent for abdominal breathing and breath support exercises.   Baseline:  Goal status: INITIAL  3.  The patient will demonstrate independent understanding of vocal hygiene concepts.   Baseline:  Goal status: INITIAL   ASSESSMENT:  CLINICAL IMPRESSION: Patient is a 77 y.o. male who was seen today for behavioral voice therapy.  Pt presents with improving moderate dysphonia that is c/b hoarse, breathy, low vocal intensity.  In addition he presents with s/s concerning for pharyngeal phase dysphagia when consuming thin liquids. Modified Barium Swallow Study performed on 07/18/2023 with recommendation for thin liquids via spoon.   Pt continues to exhibit improved phonation in both quality and durance! See the above treatment note for details. Pt make great progress.   OBJECTIVE IMPAIRMENTS include voice disorder and dysphagia. These impairments are limiting patient from effectively communicating at home and in community and safety when swallowing. Factors affecting potential to achieve goals and functional outcome are  pt's self-confessed pessimism . Patient will benefit from skilled SLP services to address above impairments and improve overall function.  REHAB POTENTIAL: Fair severity of impairments; pt's self-confused pessimism  PLAN: SLP FREQUENCY: 1-2x/week  SLP DURATION: 8 weeks  PLANNED INTERVENTIONS: SLP  instruction and feedback and Patient/family education    Hasini Peachey B. Dreama Saa, M.S., CCC-SLP, Tree surgeon Certified Brain Injury Specialist [facility]  [facility] Rehabilitation Services Office [phone] Ascom [phone] Fax [phone]

## 2023-08-05 NOTE — Progress Notes (Signed)
Assessment/Plan:   Memory Impairment   Adrian Lewis is a very pleasant 77 y.o. 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  For memory, the patient does not want to proceed with any further testing or studies.  He does not want to go ahead with repeat neuropsychological evaluation for diagnostic clarity.   Paresthesias   Pertinent labs included mildly elevated IgA at 451, B1 214, B6 35.4 ANA positive, ENA SSB (LA) antibody 1.3, with normal  B12, mildly positive Sjogren's, ENA, SPEP.  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  This study showed absent peroneal responses otherwise the rest of the responses were normal Discussed with the patient.  Referral to his orthopedist versus neurosurgery for further evaluation, but patient decided to seek a second opinion elsewhere, hopefully without invasive procedures.  He had been seen at the pain clinic twice, and a spinal stimulator had been recommended, but patient does not want to go that route. He does not take gabapentin any longer, instead he prefers to try the nontraditional approach.  He is using a red light massager at this time. Red light massager, and is also interested  in "calcium scrambler therapy "..  He states that there are a few clinics in [place] and in the [address], [place] would offer nutritional therapies, but he cannot afford the trips.  He is going to do research locally.   As well, the patient has been referred to rheumatology for Sjogren's disease, but he refuses to have a consultation there.  Had a long discussion with the patient and his wife about the goals of care.  He adamantly wants to have another opinion and elsewhere, for example, at a research facility.  We hope that he can find the answers that he deserves and that the treatment will result in improvement if not resolution of his symptoms.     Any changes in memory since last visit? " My memory I adequate for my purposes, don't want any treatment.  "I just use an appointment booked  repeats oneself?  Endorsed, "because of my hearing I have to ask " Disoriented when walking into a room?  Patient denies   Leaving objects in unusual places?  Patient denies   Wandering behavior?   Denies.   Any personality changes since last visit?   Denies.   Any worsening depression?:Denies  Hallucinations or paranoia?  Denies.   Seizures?   denies    Any sleep changes? Sleeps well with the CPAP Reports  vivid dreams, REM behavior or sleepwalking   Any hygiene concerns?   Denies.   Independent of bathing and dressing?  Endorsed  Does the patient needs help with medications? Patient is in charge   Who is in charge of the finances?  Patient is in charge     Any changes in appetite?  Denies.     Patient have trouble swallowing?  Endorsed, there  is nerve  damage. "Liquids are a problem".   Does the patient cook?  Yes  Any kitchen accidents such as leaving the stove on?   denies   Any headaches?    denies   Vision changes? Denies. Chronic pain?  Endorsed.  As mentioned above, the patient has tried different over-the-counter products, some nontraditional approach to pain, had attended with pain clinic,  and he declines at pending again, he does not want to have any neuro stimulator placed, he no longer wants to take gabapentin teen.  Patient where he has a lidocaine infusion while they are.  He is having an appointment with another neurologist at [facility] in the near future for second opinion. Ambulates with difficulty?  He has some difficulty due to pain and neuropathy.Uses the stationary bike   Recent falls or head injuries?    denies      Unilateral weakness, numbness or tingling?   denies   Any tremors?  Chronic mild right upper extremity tremors Any anosmia?    denies   Any incontinence of urine?  I have a urologist  Any bowel dysfunction?  Denies.      Patient lives with wife    Does the patient drive?  "Of course ", denies any issues ,only short distances  2 glasses of cider at night     Initial evaluation for memory 05/07/2022 Adrian Lewis is a very pleasant 77 y.o. RH male  seen today in follow up for memory loss. This patient is accompanied in the office by his wife who supplements the history.  Previous records as well as any outside records available were reviewed prior to todays visit.  Patient was last seen at our office on 03/27/22 at which time his MoCA was 25/30  Patient is currently on      Any changes in memory since last visit?  Patient denies any worsening of his symptoms.  Main difficulty is short-term memory, such as learning new names.  He continues to read books, playing the violin, and watching documentaries about [place] culture for example. Patient lives with: Spouse who noticed changes as well.  repeats oneself? Wife reports that he has been doing that for a long time. Disoriented when walking into a room?  Patient denies   Leaving objects in unusual places?  No changes, he still has to retrace himself to find the objects that he has left behind such as a cell phone. Ambulates  with difficulty?   He still has some balance issues, but denies any  recent falls.  [facility] [employer] [facility], does gardening. Recent falls?  Patient denies   Any head injuries?  Patient denies   History of seizures?   Patient denies   Wandering behavior?  Patient denies   Patient drives?  He drives very short distances, he is not that interested in driving, especially since his vision is not good due to macular degeneration, and his reaction time is lower, he feels that they roads are more dangerous now. Any mood changes?  The patient has a history of bipolar disorder, well controlled by psychiatry.  He is currently on ashwagandha, lithium, Lamictal and Wellbutrin.  Lithium 1125 mg p.o. daily Any depression?:  He does not feel depressed currently. Hallucinations?  Patient denies   Paranoia?  Patient denies   Patient reports that he sleeps with some vivid dreams, denies REM behavior or sleepwalking.  He has some issues with falling asleep, but then he  stays asleep. History of sleep apnea?  Endorsed.  Using his CPAP  Any hygiene concerns?  Patient denies   Independent of bathing and dressing?  Endorsed  Does the patient needs help with medications?  Patient is in charge of his medications. Who is in charge of the finances?  He and his wife do the finances together.   Any changes in appetite?  Patient denies.  He does admit to not drinking enough water. Patient have trouble swallowing? Patient denies   Does the patient cook?  Patient denies   Any kitchen accidents such as leaving the stove on? Patient denies   Any headaches?  Patient denies   double vision? He has a history of double vision corrected with glasses, and history of macular degeneration  Any focal numbness or tingling?  Patient denies   Chronic back pain Patient denies   Unilateral weakness?  Patient denies   Any tremors?  Patient denies   Any history of anosmia?  Patient denies   Any incontinence of urine?  Patient denies   Any bowel dysfunction?   Patient denies    MRI brain  5/2023No  evidence of acute intracranial abnormality. Minimal chronic small-vessel ischemic changes within the cerebral white matter, stable from the prior brain MRI of 06/11/2017.  Mild generalized cerebral atrophy. 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 Consider ENT consultation.  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   Elevated hemoglobin A1c 08/07/2021   Excessive drinking of alcohol 01/01/2023   01/01/23 - reported 4 hard ciders (4.5% ABV) nightly   Generalized anxiety disorder 06/16/2017   GERD (gastroesophageal reflux disease)    History of hernia repair 05/14/2012   History of kidney stones    Hyperlipidemia, mixed 12/19/2020   Hyponatremia 10/08/2013   Hypothyroidism    Insomnia due to medical condition 06/16/2017   Macular degeneration    Major depressive disorder    Medication overdose 06/16/2017   Mild cognitive impairment of uncertain or unknown etiology 01/01/2023   Nephrolithiasis 01/25/2020   Obstructive sleep apnea 10/07/2013   Osteopenia 04/04/2014   Paresthesia of both feet 12/03/2022   Radiculopathy 02/24/2013   Rising PSA level 08/07/2021   Shortness of breath on exertion 12/19/2020   Tremor 03/19/2022     Past Surgical History:  Procedure Laterality Date   CATARACT EXTRACTION W/ INTRAOCULAR LENS IMPLANT  2011 (APPROX)   RIGHT EYE AND REPAIR DETACHED RETINA   CYSTOSCOPY WITH RETROGRADE PYELOGRAM, URETEROSCOPY AND STENT PLACEMENT Bilateral 02/23/2021   Procedure: CYSTOSCOPY WITH RETROGRADE PYELOGRAM, URETEROSCOPY AND STENT PLACEMENT;  Surgeon: Sebastian Ache, MD;  Location: [facility];   Service: Urology;  Laterality: Bilateral;  75 MINS   EYE SURGERY     HAMMER TOE SURGERY  2012  &  2010  (APPROX)   ONE TOE , EACH FOOT   HERNIA REPAIR Bilateral    Inguinal Hernia Repair   HOLMIUM LASER APPLICATION Bilateral 02/23/2021   Procedure: HOLMIUM LASER APPLICATION;  Surgeon: Sebastian Ache, MD;  Location: [facility];  Service: Urology;  Laterality: Bilateral;   JOINT REPLACEMENT Left 2014   Partial Knee Replacement   KNEE SURGERY Left  x 2  partial and reconstructive   NASAL TURBINATE REDUCTION Bilateral 09/27/2015   Procedure: TURBINATE REDUCTION/SUBMUCOSAL RESECTION;  Surgeon: Geanie Logan, MD;  Location: [facility];  Service: ENT;  Laterality: Bilateral;   REPAIR RIGHT INGUINAL HERNIA W/ MESH  02-14-2006   SEPTOPLASTY N/A 09/27/2015   Procedure: SEPTOPLASTY;  Surgeon: Geanie Logan, MD;  Location: [facility];  Service: ENT;  Laterality: N/A;   URETEROSCOPY  08/12/2012   Procedure: URETEROSCOPY;  Surgeon: Sebastian Ache, MD;  [facility];  Service: Urology;  Laterality: Left;  1 HR      PREVIOUS MEDICATIONS:   CURRENT MEDICATIONS:  Outpatient Encounter Medications as of 08/05/2023  Medication Sig   Ascorbic Acid (VITAMIN C) 1000 MG tablet Take 1,000 mg by mouth daily.   b complex vitamins capsule Take 1 capsule by mouth daily.   buPROPion (WELLBUTRIN SR) 200 MG 12 hr tablet Take 1 tablet (200 mg total) by mouth in the morning.   Coenzyme Q10 (COQ10 PO) Take 60 mg by mouth daily.   ergocalciferol (VITAMIN D2) 1.25 MG (50000 UT) capsule Take 50,000 Units by mouth 2 (two) times a week.   lamoTRIgine (LAMICTAL) 100 MG tablet Take 1 tablet (100 mg total) by mouth daily.   levothyroxine (SYNTHROID, LEVOTHROID) 25 MCG tablet Take 25 mcg by mouth Daily.    lithium carbonate (ESKALITH) 450 MG ER tablet Take 1 tablet (450 mg total) by mouth 2 (two) times daily. Dose change (Patient taking differently: Take 450 mg by mouth daily. Dose change)   magnesium oxide  (MAG-OX) 400 MG tablet Take by mouth.   Misc Natural Products (PROSTATE HEALTH PO) Take 1 capsule by mouth daily.   Multiple Vitamins-Minerals (OCUVITE PRESERVISION PO) Take 1 tablet by mouth daily.   Omega-3 Fatty Acids (FISH OIL) 1000 MG CAPS Take 1,000 mg by mouth daily.   OVER THE COUNTER MEDICATION Take 1 capsule by mouth daily. Adrenogen   potassium citrate (UROCIT-K) 10 MEQ (1080 MG) SR tablet 1 tablet with meals   Probiotic Product (PROBIOTIC DAILY PO) Take 1 capsule by mouth daily.   Quercetin 500 MG CAPS Take 1 capsule by mouth daily.   RESVERATROL PO Take 500 mg by mouth daily.   S-Adenosylmethionine (SAME) 400 MG TABS Take 400 mg by mouth daily.   senna-docusate (SENOKOT-S) 8.6-50 MG tablet Take 1 tablet by mouth 2 (two) times daily. While taking strongest pain meds to prevent constipation.   sodium fluoride (FLUORISHIELD) 1.1 % GEL dental gel Place onto teeth.   telmisartan (MICARDIS) 20 MG tablet Take 40 mg by mouth daily.   theophylline (THEO-24) 300 MG 24 hr capsule Take 300 mg by mouth daily.   Tryptophan 500 MG CAPS Take 1 capsule by mouth at bedtime.   zaleplon (SONATA) 10 MG capsule Take 1 capsule (10 mg total) by mouth at bedtime as needed for sleep.   Menaquinone-7 (VITAMIN K2) 100 MCG CAPS Take 1 capsule by mouth daily. (Patient not taking: Reported on 08/05/2023)   No facility-administered encounter medications on file as of 08/05/2023.     Objective:     PHYSICAL EXAMINATION:    VITALS:   Vitals:   08/05/23 1117  BP: 120/74  Pulse: 88  Resp: 18  SpO2: 96%  Weight: 165 lb (74.8 kg)  Height: 5\' 7"  (1.702 m)    GEN:  The patient appears stated age and is in NAD. HEENT:  Normocephalic, atraumatic.   Neurological examination:  General: NAD, well-groomed, appears stated age. Orientation:  The patient is alert. Oriented to person, place and date Cranial nerves: There is good facial symmetry.anxious appearing easily irritable.  The speech is fluent and  clear. No aphasia or dysarthria. Fund of knowledge is appropriate. Recent memory impaired and remote memory is normal.  Attention and concentration are normal.  Able to name objects and repeat phrases.  Hearing is slightly decreased to conversational tone  Sensation: Sensation is intact to light touch throughout Motor: Strength is at least antigravity x4. DTR's 2/4 in UE/LE      03/27/2022   10:00 AM  Montreal Cognitive Assessment   Visuospatial/ Executive (0/5) 2  Naming (0/3) 3  Attention: Read list of digits (0/2) 2  Attention: Read list of letters (0/1) 1  Attention: Serial 7 subtraction starting at 100 (0/3) 3  Language: Repeat phrase (0/2) 2  Language : Fluency (0/1) 1  Abstraction (0/2) 2  Delayed Recall (0/5) 3  Orientation (0/6) 6  Total 25  Adjusted Score (based on education) 25        No data to display             Movement examination: Tone: There is normal tone in the UE/LE Abnormal movements: very mils R hand   No myoclonus.  No asterixis.   Coordination:  There is no decremation with RAM's. Normal finger to nose  Gait and Station: The patient has no difficulty arising out of a deep-seated chair without the use of the hands. The patient's stride length is good.  Gait is cautious and narrow.   Thank you for allowing [place] the opportunity to participate in the care of this nice patient. Please do not hesitate to contact us for any questions or concerns.   Total time spent on today's visit was 41 minutes dedicated to this patient today, preparing to see patient, examining the patient, ordering tests and/or medications and counseling the patient, documenting clinical information in the EHR or other health record, independently interpreting results and communicating results to the patient/family, discussing treatment and goals, answering patient's questions and coordinating care.  Cc:  Dorothey Baseman, MD  Marlowe Kays 08/06/2023 7:05 PM

## 2023-08-05 NOTE — Patient Instructions (Signed)
It was a pleasure to see you today at our office.   Recommendations:   God to see you again  Follow up as needed     RECOMMENDATIONS FOR ALL PATIENTS WITH MEMORY PROBLEMS: 1. Continue to exercise (Recommend 30 minutes of walking everyday, or 3 hours every week) 2. Increase social interactions - continue going to [place] and enjoy social gatherings with friends and family 3. Eat healthy, avoid fried foods and eat more fruits and vegetables 4. Maintain adequate blood pressure, blood sugar, and blood cholesterol level. Reducing the risk of stroke and cardiovascular disease also helps promoting better memory. 5. Avoid stressful situations. Live a simple life and avoid aggravations. Organize your time and prepare for the next day in anticipation. 6. Sleep well, avoid any interruptions of sleep and avoid any distractions in the bedroom that may interfere with adequate sleep quality 7. Avoid sugar, avoid sweets as there is a strong link between excessive sugar intake, diabetes, and cognitive impairment We discussed the Mediterranean diet, which has been shown to help patients reduce the risk of progressive memory disorders and reduces cardiovascular risk. This includes eating fish, eat fruits and green leafy vegetables, nuts like almonds and hazelnuts, walnuts, and also use olive oil. Avoid fast foods and fried foods as much as possible. Avoid sweets and sugar as sugar use has been linked to worsening of memory function.  There is always a concern of gradual progression of memory problems. If this is the case, then we may need to adjust level of care according to patient needs. Support, both to the patient and caregiver, should then be put into place.      You have been referred for a neuropsychological evaluation (i.e., evaluation of memory and thinking abilities). Please bring someone with you to this appointment if possible, as it is helpful for the doctor to hear from both you and another adult  who knows you well. Please bring eyeglasses and hearing aids if you wear them.    The evaluation will take approximately 3 hours and has two parts:   The first part is a clinical interview with the neuropsychologist (Dr. Milbert Coulter or Dr. Roseanne Reno). During the interview, the neuropsychologist will speak with you and the individual you brought to the appointment.    The second part of the evaluation is testing with the doctor's technician Annabelle Harman or Selena Batten). During the testing, the technician will ask you to remember different types of material, solve problems, and answer some questionnaires. Your family member will not be present for this portion of the evaluation.   Please note: We must reserve several hours of the neuropsychologist's time and the psychometrician's time for your evaluation appointment. As such, there is a No-Show fee of $100. If you are unable to attend any of your appointments, please contact our office as soon as possible to reschedule.    FALL PRECAUTIONS: Be cautious when walking. Scan the area for obstacles that may increase the risk of trips and falls. When getting up in the mornings, sit up at the edge of the bed for a few minutes before getting out of bed. Consider elevating the bed at the head end to avoid drop of blood pressure when getting up. Walk always in a well-lit room (use night lights in the walls). Avoid area rugs or power cords from appliances in the middle of the walkways. Use a walker or a cane if necessary and consider physical therapy for balance exercise. Get your eyesight checked regularly.  FINANCIAL  OVERSIGHT: Supervision, especially oversight when making financial decisions or transactions is also recommended.  HOME SAFETY: Consider the safety of the kitchen when operating appliances like stoves, microwave oven, and blender. Consider having supervision and share cooking responsibilities until no longer able to participate in those. Accidents with firearms and other  hazards in the house should be identified and addressed as well.   ABILITY TO BE LEFT ALONE: If patient is unable to contact 911 operator, consider using LifeLine, or when the need is there, arrange for someone to stay with patients. Smoking is a fire hazard, consider supervision or cessation. Risk of wandering should be assessed by caregiver and if detected at any point, supervision and safe proof recommendations should be instituted.  MEDICATION SUPERVISION: Inability to self-administer medication needs to be constantly addressed. Implement a mechanism to ensure safe administration of the medications.   DRIVING: Regarding driving, in patients with progressive memory problems, driving will be impaired. We advise to have someone else do the driving if trouble finding directions or if minor accidents are reported. Independent driving assessment is available to determine safety of driving.   If you are interested in the driving assessment, you can contact the following:  [employer] in [place] [phone]  [facility] [phone]  [facility] [phone] or [phone]    Mediterranean Diet A Mediterranean diet refers to food and lifestyle choices that are based on the traditions of countries located on the [employer]. This way of eating has been shown to help prevent certain conditions and improve outcomes for people who have chronic diseases, like kidney disease and heart disease. What are tips for following this plan? Lifestyle  Cook and eat meals together with your family, when possible. Drink enough fluid to keep your urine clear or pale yellow. Be physically active every day. This includes: Aerobic exercise like running or swimming. Leisure activities like gardening, walking, or housework. Get 7-8 hours of sleep each night. If recommended by your health care provider, drink red wine in  moderation. This means 1 glass a day for nonpregnant women and 2 glasses a day for men. A glass of wine equals 5 oz (150 mL). Reading food labels  Check the serving size of packaged foods. For foods such as rice and pasta, the serving size refers to the amount of cooked product, not dry. Check the total fat in packaged foods. Avoid foods that have saturated fat or trans fats. Check the ingredients list for added sugars, such as corn syrup. Shopping  At the grocery store, buy most of your food from the areas near the walls of the store. This includes: Fresh fruits and vegetables (produce). Grains, beans, nuts, and seeds. Some of these may be available in unpackaged forms or large amounts (in bulk). Fresh seafood. Poultry and eggs. Low-fat dairy products. Buy whole ingredients instead of prepackaged foods. Buy fresh fruits and vegetables in-season from local farmers markets. Buy frozen fruits and vegetables in resealable bags. If you do not have access to quality fresh seafood, buy precooked frozen shrimp or canned fish, such as tuna, salmon, or sardines. Buy small amounts of raw or cooked vegetables, salads, or olives from the deli or salad bar at your store. Stock your pantry so you always have certain foods on hand, such as olive oil, canned tuna, canned tomatoes, rice, pasta, and beans. Cooking  Cook foods with extra-virgin olive oil instead of using butter or other vegetable oils. Have meat as  a side dish, and have vegetables or grains as your main dish. This means having meat in small portions or adding small amounts of meat to foods like pasta or stew. Use beans or vegetables instead of meat in common dishes like chili or lasagna. Experiment with different cooking methods. Try roasting or broiling vegetables instead of steaming or sauteing them. Add frozen vegetables to soups, stews, pasta, or rice. Add nuts or seeds for added healthy fat at each meal. You can add these to yogurt,  salads, or vegetable dishes. Marinate fish or vegetables using olive oil, lemon juice, garlic, and fresh herbs. Meal planning  Plan to eat 1 vegetarian meal one day each week. Try to work up to 2 vegetarian meals, if possible. Eat seafood 2 or more times a week. Have healthy snacks readily available, such as: Vegetable sticks with hummus. Greek yogurt. Fruit and nut trail mix. Eat balanced meals throughout the week. This includes: Fruit: 2-3 servings a day Vegetables: 4-5 servings a day Low-fat dairy: 2 servings a day Fish, poultry, or lean meat: 1 serving a day Beans and legumes: 2 or more servings a week Nuts and seeds: 1-2 servings a day Whole grains: 6-8 servings a day Extra-virgin olive oil: 3-4 servings a day Limit red meat and sweets to only a few servings a month What are my food choices? Mediterranean diet Recommended Grains: Whole-grain pasta. Brown rice. Bulgar wheat. Polenta. Couscous. Whole-wheat bread. Orpah Cobb. Vegetables: Artichokes. Beets. Broccoli. Cabbage. Carrots. Eggplant. Green beans. Chard. Kale. Spinach. Onions. Leeks. Peas. Squash. Tomatoes. Peppers. Radishes. Fruits: Apples. Apricots. Avocado. Berries. Bananas. Cherries. Dates. Figs. Grapes. Lemons. Melon. Oranges. Peaches. Plums. Pomegranate. Meats and other protein foods: Beans. Almonds. Sunflower seeds. Pine nuts. Peanuts. Cod. Salmon. Scallops. Shrimp. Tuna. Tilapia. Clams. Oysters. Eggs. Dairy: Low-fat milk. Cheese. Greek yogurt. Beverages: Water. Red wine. Herbal tea. Fats and oils: Extra virgin olive oil. Avocado oil. Grape seed oil. Sweets and desserts: [place] yogurt with honey. Baked apples. Poached pears. Trail mix. Seasoning and other foods: Basil. Cilantro. Coriander. Cumin. Mint. Parsley. Sage. Rosemary. Tarragon. Garlic. Oregano. Thyme. Pepper. Balsalmic vinegar. Tahini. Hummus. Tomato sauce. Olives. Mushrooms. Limit these Grains: Prepackaged pasta or rice dishes. Prepackaged cereal with  added sugar. Vegetables: Deep fried potatoes (french fries). Fruits: Fruit canned in syrup. Meats and other protein foods: Beef. Pork. Lamb. Poultry with skin. Hot dogs. Tomasa Blase. Dairy: Ice cream. Sour cream. Whole milk. Beverages: Juice. Sugar-sweetened soft drinks. Beer. Liquor and spirits. Fats and oils: Butter. Canola oil. Vegetable oil. Beef fat (tallow). Lard. Sweets and desserts: Cookies. Cakes. Pies. Candy. Seasoning and other foods: Mayonnaise. Premade sauces and marinades. The items listed may not be a complete list. Talk with your dietitian about what dietary choices are right for you. Summary The Mediterranean diet includes both food and lifestyle choices. Eat a variety of fresh fruits and vegetables, beans, nuts, seeds, and whole grains. Limit the amount of red meat and sweets that you eat. Talk with your health care provider about whether it is safe for you to drink red wine in moderation. This means 1 glass a day for nonpregnant women and 2 glasses a day for men. A glass of wine equals 5 oz (150 mL). This information is not intended to replace advice given to you by your health care provider. Make sure you discuss any questions you have with your health care provider. Document Released: 07/04/2016 Document Revised: 08/06/2016 Document Reviewed: 07/04/2016 Elsevier Interactive Patient Education  2017 [employer].   We have  sent a referral to [facility] for your MRI and they will call you directly to schedule your appointment. They are located at [address] [facility]. If you need to contact them directly please call [phone].

## 2023-08-06 ENCOUNTER — Ambulatory Visit: Payer: Medicare Other | Admitting: Speech Pathology

## 2023-08-07 ENCOUNTER — Ambulatory Visit: Payer: Medicare Other | Admitting: Speech Pathology

## 2023-08-07 DIAGNOSIS — R490 Dysphonia: Secondary | ICD-10-CM | POA: Diagnosis not present

## 2023-08-07 DIAGNOSIS — J3801 Paralysis of vocal cords and larynx, unilateral: Secondary | ICD-10-CM

## 2023-08-07 NOTE — Therapy (Signed)
OUTPATIENT SPEECH LANGUAGE PATHOLOGY  TREATMENT NOTE   Patient Name: Adrian Lewis MRN: [id number] DOB:15-Jun-1946, 77 y.o., male Today's Date: 08/07/2023  PCP: Dorothey Baseman, MD REFERRING PROVIDER: Geanie Logan, MD   End of Session - 08/07/23 1406     Visit Number 6    Number of Visits 17    Date for SLP Re-Evaluation 09/10/23    Authorization Type Medicare A/Medicare B    Progress Note Due on Visit 10    SLP Start Time 1400    SLP Stop Time  1445    SLP Time Calculation (min) 45 min    Activity Tolerance Patient tolerated treatment well             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   Elevated hemoglobin A1c 08/07/2021   Excessive drinking of alcohol 01/01/2023   01/01/23 - reported 4 hard ciders (4.5% ABV) nightly   Generalized anxiety disorder 06/16/2017   GERD (gastroesophageal reflux disease)    History of hernia repair 05/14/2012   History of kidney stones    Hyperlipidemia, mixed 12/19/2020   Hyponatremia 10/08/2013   Hypothyroidism    Insomnia due to medical condition 06/16/2017   Macular degeneration    Major depressive disorder    Medication overdose 06/16/2017   Mild cognitive impairment of uncertain or unknown etiology 01/01/2023   Nephrolithiasis 01/25/2020   Obstructive sleep apnea 10/07/2013   Osteopenia 04/04/2014   Paresthesia of both feet 12/03/2022   Radiculopathy 02/24/2013   Rising PSA level 08/07/2021   Shortness of breath on exertion 12/19/2020   Tremor 03/19/2022   Past Surgical History:  Procedure Laterality Date   CATARACT EXTRACTION W/ INTRAOCULAR LENS IMPLANT  2011 (APPROX)   RIGHT EYE AND REPAIR DETACHED RETINA   CYSTOSCOPY WITH RETROGRADE PYELOGRAM,  URETEROSCOPY AND STENT PLACEMENT Bilateral 02/23/2021   Procedure: CYSTOSCOPY WITH RETROGRADE PYELOGRAM, URETEROSCOPY AND STENT PLACEMENT;  Surgeon: Sebastian Ache, MD;  Location: [facility];  Service: Urology;  Laterality: Bilateral;  75 MINS   EYE SURGERY     HAMMER TOE SURGERY  2012  &  2010  (APPROX)   ONE TOE , EACH FOOT   HERNIA REPAIR Bilateral    Inguinal Hernia Repair   HOLMIUM LASER APPLICATION Bilateral 02/23/2021   Procedure: HOLMIUM LASER APPLICATION;  Surgeon: Sebastian Ache, MD;  Location: [facility];  Service: Urology;  Laterality: Bilateral;   JOINT REPLACEMENT Left 2014   Partial Knee Replacement   KNEE SURGERY Left    x 2  partial and reconstructive   NASAL TURBINATE REDUCTION Bilateral 09/27/2015   Procedure: TURBINATE REDUCTION/SUBMUCOSAL RESECTION;  Surgeon: Geanie Logan, MD;  Location: [facility];  Service: ENT;  Laterality: Bilateral;   REPAIR RIGHT INGUINAL HERNIA W/ MESH  02-14-2006   SEPTOPLASTY N/A 09/27/2015   Procedure: SEPTOPLASTY;  Surgeon: Geanie Logan, MD;  Location: [facility];  Service: ENT;  Laterality: N/A;   URETEROSCOPY  08/12/2012   Procedure: URETEROSCOPY;  Surgeon: Sebastian Ache, MD;  [facility];  Service: Urology;  Laterality: Left;  1 HR    Patient Active Problem List   Diagnosis Date Noted   Neuropathic pain of both feet 04/15/2023   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   Chronic pain syndrome  04/15/2023   Mild cognitive impairment 01/01/2023   Excessive drinking of alcohol 01/01/2023   Major depressive disorder 12/31/2022   GERD (gastroesophageal reflux disease) 12/31/2022   Macular degeneration 12/31/2022   Paresthesia of both feet 12/03/2022   Tremor 03/19/2022   Circadian rhythm sleep disorder, delayed sleep phase type 01/21/2022   Bipolar disorder II, in full remission, most recent episode depressed 01/21/2022   High risk medication use 01/21/2022   Elevated hemoglobin A1c 08/07/2021   Rising PSA  level 08/07/2021   Hyperlipidemia, mixed 12/19/2020   Benign essential hypertension 12/19/2020   Abnormal ECG 12/19/2020   Shortness of breath on exertion 12/19/2020   Nephrolithiasis 01/25/2020   Medication overdose 06/16/2017   Insomnia due to medical condition 06/16/2017   Generalized anxiety disorder 06/16/2017   Osteopenia 04/04/2014   Hyponatremia 10/08/2013   Obstructive sleep apnea 10/07/2013   Hypothyroidism 10/07/2013   Bilateral sciatica 10/07/2013   Back pain 02/24/2013   Radiculopathy 02/24/2013   History of hernia repair 05/14/2012    ONSET DATE:  03/27/2023; date of referral 07/10/2023  REFERRING DIAG: 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 Laryngoscopy revealed hypomobility of right vocal cord and complete immobility of left vocal cord.   Modified Barium Swallow Study 05/12/2023 No aspiration observed, continue regular diet with thin liquids  06/26/2023 Left hemithyroidectomy  Bedside Swallow Evaluation Recommend continue regular diet with thin liquids, head turn to pt's left to reduce s/s of aspiration at bedside  07/10/2023 Referral received from Dr Geanie Logan Laryngoscopy revealed "left cord as immobile, right cord does move although there may be some limited abduction."    PAIN:  Are you having pain? No   FALLS: Has patient fallen in last 6 months? No,  LIVING ENVIRONMENT: Lives with: lives with their  spouse Lives in: House/apartment  PLOF: Independent  PATIENT GOALS    to swallow safely and improve vocal quality  SUBJECTIVE STATEMENT: Pt smiling at different points throughout the session today Pt accompanied by: significant other  OBJECTIVE:   TODAY'S TREATMENT:  Skilled treatment session focused on pt's dysphonia. SLP facilitated session by providing the following interventions:  Phonation utilizing push and pull: rare Min A provided for sustained phonation using "ah" during push- improved quality and durance of phonation observed - Pitch 165 Hz, 5 seconds;   Breath hold/phonation - Maximal A for sequence of activity for phonation of "ah" - prolonged phonation heard with improved quality throughout phonation observed!   Expiratory Muscle Strength Trainer (EMST) - maximal assistance for slow rate and quick bursts of air to achieve 7 out of 10 with device increased to 75 cmH2O, pt with difficulty sequencing task   SLP further facilitated session by having pt read list of sentences. Pt with difficulty reading and coordinating vocal activity. SLP presented words and phrase length utterances with moderate A for improving vocal quality thru respiratory support.   Skilled observation was also provided of pt consuming thin liquids via bottle. No observed s/s of aspiration noted with single  small sips - pt with confirmed silent aspiration of thin liquids via cup sips on most recent MBSS.    PATIENT EDUCATION: Education details: see above Person educated: Patient and Spouse Education method: Explanation Education comprehension: needs further education   HOME EXERCISE PROGRAM: Perform the above 3 times per days 8 times each  GOALS: Goals reviewed with patient? Yes  SHORT TERM GOALS: Target date: 10 sessions  Patient will participate in objective swallowing evaluation (MBSS) to identify safest diet recommendation as well as therapeutic targets.  Baseline: Goal status:  INITIAL  2.  With Mod I, pt will demonstrate understanding of result and recommendations of MBS by verbalizing salient points.   Baseline:  Goal status: INITIAL  3.  The patient will maximize voice quality and loudness using breath support/oral resonance for sustained vowel production, pitch glides, and hierarchal speech drill.   Baseline:  Goal status: INITIAL  LONG TERM GOALS: Target date: 09/10/2023  Patient will improve perception of swallowing as indicated by an improvement in EAT-10 score by 12 weeks from initial swallowing therapy session.  Baseline:  Goal status: INITIAL  2.  The patient will be independent for abdominal breathing and breath support exercises.   Baseline:  Goal status: INITIAL  3.  The patient will demonstrate independent understanding of vocal hygiene concepts.   Baseline:  Goal status: INITIAL   ASSESSMENT:  CLINICAL IMPRESSION: Patient is a 77 y.o. male who was seen today for behavioral voice therapy.  Pt presents with improving moderate dysphonia that is c/b hoarse, breathy, low vocal intensity.  In addition he presents with s/s concerning for pharyngeal phase dysphagia when consuming thin liquids. Modified Barium Swallow Study performed on 07/18/2023 with recommendation for thin liquids via spoon.   Pt continues to exhibit improved phonation in both quality and durance! See the above treatment note for details. Pt maing great progress.   OBJECTIVE IMPAIRMENTS include voice disorder and dysphagia. These impairments are limiting patient from effectively communicating at home and in community and safety when swallowing. Factors affecting potential to achieve goals and functional outcome are  pt's self-confessed pessimism . Patient will benefit from skilled SLP services to address above impairments and improve overall function.  REHAB POTENTIAL: Fair severity of impairments; pt's self-confused pessimism  PLAN: SLP FREQUENCY: 1-2x/week  SLP  DURATION: 8 weeks  PLANNED INTERVENTIONS: SLP instruction and feedback and Patient/family education    Aletha Allebach B. Dreama Saa, M.S., CCC-SLP, Tree surgeon Certified Brain Injury Specialist [facility]  [facility] Rehabilitation Services Office [phone] Ascom [phone] Fax [phone]

## 2023-08-08 ENCOUNTER — Telehealth: Payer: Self-pay | Admitting: Physician Assistant

## 2023-08-08 NOTE — Telephone Encounter (Signed)
We find it necessary to inform you that we will no longer be able to provide medical care to you due to your request to no follow-up with [facility] and transfer care 08/05/23

## 2023-08-11 ENCOUNTER — Ambulatory Visit: Payer: Medicare Other | Admitting: Speech Pathology

## 2023-08-11 DIAGNOSIS — R490 Dysphonia: Secondary | ICD-10-CM | POA: Diagnosis not present

## 2023-08-11 DIAGNOSIS — J3801 Paralysis of vocal cords and larynx, unilateral: Secondary | ICD-10-CM

## 2023-08-11 DIAGNOSIS — R1313 Dysphagia, pharyngeal phase: Secondary | ICD-10-CM

## 2023-08-11 NOTE — Therapy (Unsigned)
OUTPATIENT SPEECH LANGUAGE PATHOLOGY  TREATMENT NOTE   Patient Name: Adrian Lewis MRN: [id number] DOB:[birth year], 77 y.o., male Today's Date: 08/11/2023  PCP: Dorothey Baseman, MD REFERRING PROVIDER: Geanie Logan, MD   End of Session - 08/11/23 1539     Visit Number 7    Number of Visits 17    Date for SLP Re-Evaluation 09/10/23    Authorization Type Medicare A/Medicare B    Progress Note Due on Visit 10    SLP Start Time 1315    SLP Stop Time  1400    SLP Time Calculation (min) 45 min    Activity Tolerance Patient tolerated treatment well             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   Elevated hemoglobin A1c 08/07/2021   Excessive drinking of alcohol 01/01/2023   01/01/23 - reported 4 hard ciders (4.5% ABV) nightly   Generalized anxiety disorder 06/16/2017   GERD (gastroesophageal reflux disease)    History of hernia repair 05/14/2012   History of kidney stones    Hyperlipidemia, mixed 12/19/2020   Hyponatremia 10/08/2013   Hypothyroidism    Insomnia due to medical condition 06/16/2017   Macular degeneration    Major depressive disorder    Medication overdose 06/16/2017   Mild cognitive impairment of uncertain or unknown etiology 01/01/2023   Nephrolithiasis 01/25/2020   Obstructive sleep apnea 10/07/2013   Osteopenia 04/04/2014   Paresthesia of both feet 12/03/2022   Radiculopathy 02/24/2013   Rising PSA level 08/07/2021   Shortness of breath on exertion 12/19/2020   Tremor 03/19/2022   Past Surgical History:  Procedure Laterality Date   CATARACT EXTRACTION W/ INTRAOCULAR LENS IMPLANT  2011 (APPROX)   RIGHT EYE AND REPAIR DETACHED RETINA   CYSTOSCOPY WITH RETROGRADE PYELOGRAM,  URETEROSCOPY AND STENT PLACEMENT Bilateral 02/23/2021   Procedure: CYSTOSCOPY WITH RETROGRADE PYELOGRAM, URETEROSCOPY AND STENT PLACEMENT;  Surgeon: Sebastian Ache, MD;  Location: [facility];  Service: Urology;  Laterality: Bilateral;  75 MINS   EYE SURGERY     HAMMER TOE SURGERY  2012  &  2010  (APPROX)   ONE TOE , EACH FOOT   HERNIA REPAIR Bilateral    Inguinal Hernia Repair   HOLMIUM LASER APPLICATION Bilateral 02/23/2021   Procedure: HOLMIUM LASER APPLICATION;  Surgeon: Sebastian Ache, MD;  Location: [facility];  Service: Urology;  Laterality: Bilateral;   JOINT REPLACEMENT Left 2014   Partial Knee Replacement   KNEE SURGERY Left    x 2  partial and reconstructive   NASAL TURBINATE REDUCTION Bilateral 09/27/2015   Procedure: TURBINATE REDUCTION/SUBMUCOSAL RESECTION;  Surgeon: Geanie Logan, MD;  Location: [facility];  Service: ENT;  Laterality: Bilateral;   REPAIR RIGHT INGUINAL HERNIA W/ MESH  02-14-2006   SEPTOPLASTY N/A 09/27/2015   Procedure: SEPTOPLASTY;  Surgeon: Geanie Logan, MD;  Location: [facility];  Service: ENT;  Laterality: N/A;   URETEROSCOPY  08/12/2012   Procedure: URETEROSCOPY;  Surgeon: Sebastian Ache, MD;  Location: [facility];  Service: Urology;  Laterality: Left;  1 HR    Patient Active Problem List   Diagnosis Date Noted   Neuropathic pain of both feet 04/15/2023   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   Chronic pain syndrome  04/15/2023   Mild cognitive impairment 01/01/2023   Excessive drinking of alcohol 01/01/2023   Major depressive disorder 12/31/2022   GERD (gastroesophageal reflux disease) 12/31/2022   Macular degeneration 12/31/2022   Paresthesia of both feet 12/03/2022   Tremor 03/19/2022   Circadian rhythm sleep disorder, delayed sleep phase type 01/21/2022   Bipolar disorder II, in full remission, most recent episode depressed 01/21/2022   High risk medication use 01/21/2022   Elevated hemoglobin A1c 08/07/2021   Rising PSA  level 08/07/2021   Hyperlipidemia, mixed 12/19/2020   Benign essential hypertension 12/19/2020   Abnormal ECG 12/19/2020   Shortness of breath on exertion 12/19/2020   Nephrolithiasis 01/25/2020   Medication overdose 06/16/2017   Insomnia due to medical condition 06/16/2017   Generalized anxiety disorder 06/16/2017   Osteopenia 04/04/2014   Hyponatremia 10/08/2013   Obstructive sleep apnea 10/07/2013   Hypothyroidism 10/07/2013   Bilateral sciatica 10/07/2013   Back pain 02/24/2013   Radiculopathy 02/24/2013   History of hernia repair 05/14/2012    ONSET DATE:  03/27/2023; date of referral 07/10/2023  REFERRING DIAG: 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 Laryngoscopy revealed hypomobility of right vocal cord and complete immobility of left vocal cord.   Modified Barium Swallow Study 05/12/2023 No aspiration observed, continue regular diet with thin liquids  06/26/2023 Left hemithyroidectomy  Bedside Swallow Evaluation Recommend continue regular diet with thin liquids, head turn to pt's left to reduce s/s of aspiration at bedside  07/10/2023 Referral received from Dr Geanie Logan Laryngoscopy revealed "left cord as immobile, right cord does move although there may be some limited abduction."    PAIN:  Are you having pain? No   FALLS: Has patient fallen in last 6 months? No,  LIVING ENVIRONMENT: Lives  with: lives with their spouse Lives in: House/apartment  PLOF: Independent  PATIENT GOALS    to swallow safely and improve vocal quality  SUBJECTIVE STATEMENT: Pt with improved abilities today with less cues Pt accompanied by: significant other  OBJECTIVE:   TODAY'S TREATMENT:  Skilled treatment session focused on pt's dysphonia. SLP facilitated session by providing the following interventions:  Expiratory Muscle Strength Trainer (EMST) - rare supervision cues for slow rate and quick bursts of air to achieve 7 out of 10 with device increased to 75 cmH2O, pt with much improved ability to sequence task - GREAT [employer]OB  Phonation utilizing push and pull: rare Min A provided for sustained phonation using "ah" during push- improved quality and durance of phonation observed when pt holding his head in neutral position and projecting his voice "up and out" not down into his lap  Additional time and instruction provided on abdominal breathing and use of EMST to identify use of respiratory muscles during phonation. With some practice, pt able to engage in abdominal breathing when producing list of functional phrases with improved vocal quality and loudness. While phonation can be heard, it is still c/b hoarse, breathy and somewhat harsh vocal quality but continues to  improve  In conversation, pt was able to carryover the improve respiratory support to achieve improved phonation and loudness.   Skilled observation was also provided of pt consuming thin liquids via bottle. Pt with immediate coughing x each sip. Education provided on likely improved sensory component as pt's aspiration had been silent. Pt stated, "I am still aspirating a lot" to which his wife commented "but you aren't using a spoon." Education provided by this writer that sip observed just now in the session was much larger than that of a spoon.   PATIENT EDUCATION: Education details: see above Person educated: Patient and  Spouse Education method: Explanation Education comprehension: needs further education   HOME EXERCISE PROGRAM: Perform the above 3 times per days 8 times each  GOALS: Goals reviewed with patient? Yes  SHORT TERM GOALS: Target date: 10 sessions  Patient will participate in objective swallowing evaluation (MBSS) to identify safest diet recommendation as well as therapeutic targets.  Baseline: Goal status: INITIAL  2.  With Mod I, pt will demonstrate understanding of result and recommendations of MBS by verbalizing salient points.   Baseline:  Goal status: INITIAL  3.  The patient will maximize voice quality and loudness using breath support/oral resonance for sustained vowel production, pitch glides, and hierarchal speech drill.   Baseline:  Goal status: INITIAL  LONG TERM GOALS: Target date: 09/10/2023  Patient will improve perception of swallowing as indicated by an improvement in EAT-10 score by 12 weeks from initial swallowing therapy session.  Baseline:  Goal status: INITIAL  2.  The patient will be independent for abdominal breathing and breath support exercises.   Baseline:  Goal status: INITIAL  3.  The patient will demonstrate independent understanding of vocal hygiene concepts.   Baseline:  Goal status: INITIAL   ASSESSMENT:  CLINICAL IMPRESSION: Patient is a 77 y.o. male who was seen today for behavioral voice therapy.  Pt presents with improving moderate dysphonia that is c/b hoarse, breathy, low vocal intensity.  In addition he presents with s/s concerning for pharyngeal phase dysphagia when consuming thin liquids. Modified Barium Swallow Study performed on 07/18/2023 with recommendation for thin liquids via spoon.   Pt continues to exhibit improved phonation in both quality and durance! See the above treatment note for details. Pt making great progress.   OBJECTIVE IMPAIRMENTS include voice disorder and dysphagia. These impairments are limiting patient  from effectively communicating at home and in community and safety when swallowing. Factors affecting potential to achieve goals and functional outcome are  pt's self-confessed pessimism . Patient will benefit from skilled SLP services to address above impairments and improve overall function.  REHAB POTENTIAL: Fair severity of impairments; pt's self-confused pessimism  PLAN: SLP FREQUENCY: 1-2x/week  SLP DURATION: 8 weeks  PLANNED INTERVENTIONS: SLP instruction and feedback and Patient/family education    Alecsander Hattabaugh B. Dreama Saa, M.S., CCC-SLP, Tree surgeon Certified Brain Injury Specialist [facility]  [facility] Rehabilitation Services Office [phone] Ascom [phone] Fax [phone]

## 2023-08-13 ENCOUNTER — Ambulatory Visit: Payer: Medicare Other | Admitting: Speech Pathology

## 2023-08-13 DIAGNOSIS — J3801 Paralysis of vocal cords and larynx, unilateral: Secondary | ICD-10-CM

## 2023-08-13 DIAGNOSIS — R490 Dysphonia: Secondary | ICD-10-CM

## 2023-08-13 DIAGNOSIS — R1313 Dysphagia, pharyngeal phase: Secondary | ICD-10-CM

## 2023-08-13 NOTE — Therapy (Signed)
OUTPATIENT SPEECH LANGUAGE PATHOLOGY  TREATMENT NOTE   Patient Name: Adrian Lewis MRN: [id number] DOB:1946-10-08, 77 y.o., male Today's Date: 08/13/2023  PCP: Dorothey Baseman, MD REFERRING PROVIDER: Geanie Logan, MD   End of Session - 08/13/23 1409     Visit Number 8    Number of Visits 17    Date for SLP Re-Evaluation 09/10/23    Authorization Type Medicare A/Medicare B    Progress Note Due on Visit 10    SLP Start Time 1400    SLP Stop Time  1445    SLP Time Calculation (min) 45 min    Activity Tolerance Patient tolerated treatment well             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   Elevated hemoglobin A1c 08/07/2021   Excessive drinking of alcohol 01/01/2023   01/01/23 - reported 4 hard ciders (4.5% ABV) nightly   Generalized anxiety disorder 06/16/2017   GERD (gastroesophageal reflux disease)    History of hernia repair 05/14/2012   History of kidney stones    Hyperlipidemia, mixed 12/19/2020   Hyponatremia 10/08/2013   Hypothyroidism    Insomnia due to medical condition 06/16/2017   Macular degeneration    Major depressive disorder    Medication overdose 06/16/2017   Mild cognitive impairment of uncertain or unknown etiology 01/01/2023   Nephrolithiasis 01/25/2020   Obstructive sleep apnea 10/07/2013   Osteopenia 04/04/2014   Paresthesia of both feet 12/03/2022   Radiculopathy 02/24/2013   Rising PSA level 08/07/2021   Shortness of breath on exertion 12/19/2020   Tremor 03/19/2022   Past Surgical History:  Procedure Laterality Date   CATARACT EXTRACTION W/ INTRAOCULAR LENS IMPLANT  2011 (APPROX)   RIGHT EYE AND REPAIR DETACHED RETINA   CYSTOSCOPY WITH RETROGRADE PYELOGRAM,  URETEROSCOPY AND STENT PLACEMENT Bilateral 02/23/2021   Procedure: CYSTOSCOPY WITH RETROGRADE PYELOGRAM, URETEROSCOPY AND STENT PLACEMENT;  Surgeon: Sebastian Ache, MD;  Location: [facility];  Service: Urology;  Laterality: Bilateral;  75 MINS   EYE SURGERY     HAMMER TOE SURGERY  2012  &  2010  (APPROX)   ONE TOE , EACH FOOT   HERNIA REPAIR Bilateral    Inguinal Hernia Repair   HOLMIUM LASER APPLICATION Bilateral 02/23/2021   Procedure: HOLMIUM LASER APPLICATION;  Surgeon: Sebastian Ache, MD;  Location: [facility];  Service: Urology;  Laterality: Bilateral;   JOINT REPLACEMENT Left 2014   Partial Knee Replacement   KNEE SURGERY Left    x 2  partial and reconstructive   NASAL TURBINATE REDUCTION Bilateral 09/27/2015   Procedure: TURBINATE REDUCTION/SUBMUCOSAL RESECTION;  Surgeon: Geanie Logan, MD;  Location: [facility];  Service: ENT;  Laterality: Bilateral;   REPAIR RIGHT INGUINAL HERNIA W/ MESH  02-14-2006   SEPTOPLASTY N/A 09/27/2015   Procedure: SEPTOPLASTY;  Surgeon: Geanie Logan, MD;  Location: [facility];  Service: ENT;  Laterality: N/A;   URETEROSCOPY  08/12/2012   Procedure: URETEROSCOPY;  Surgeon: Sebastian Ache, MD;  Location: [facility];  Service: Urology;  Laterality: Left;  1 HR    Patient Active Problem List   Diagnosis Date Noted   Neuropathic pain of both feet 04/15/2023   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   Chronic pain syndrome  04/15/2023   Mild cognitive impairment 01/01/2023   Excessive drinking of alcohol 01/01/2023   Major depressive disorder 12/31/2022   GERD (gastroesophageal reflux disease) 12/31/2022   Macular degeneration 12/31/2022   Paresthesia of both feet 12/03/2022   Tremor 03/19/2022   Circadian rhythm sleep disorder, delayed sleep phase type 01/21/2022   Bipolar disorder II, in full remission, most recent episode depressed 01/21/2022   High risk medication use 01/21/2022   Elevated hemoglobin A1c 08/07/2021   Rising PSA  level 08/07/2021   Hyperlipidemia, mixed 12/19/2020   Benign essential hypertension 12/19/2020   Abnormal ECG 12/19/2020   Shortness of breath on exertion 12/19/2020   Nephrolithiasis 01/25/2020   Medication overdose 06/16/2017   Insomnia due to medical condition 06/16/2017   Generalized anxiety disorder 06/16/2017   Osteopenia 04/04/2014   Hyponatremia 10/08/2013   Obstructive sleep apnea 10/07/2013   Hypothyroidism 10/07/2013   Bilateral sciatica 10/07/2013   Back pain 02/24/2013   Radiculopathy 02/24/2013   History of hernia repair 05/14/2012    ONSET DATE:  03/27/2023; date of referral 07/10/2023  REFERRING DIAG: 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 Laryngoscopy revealed hypomobility of right vocal cord and complete immobility of left vocal cord.   Modified Barium Swallow Study 05/12/2023 No aspiration observed, continue regular diet with thin liquids  06/26/2023 Left hemithyroidectomy  Bedside Swallow Evaluation Recommend continue regular diet with thin liquids, head turn to pt's left to reduce s/s of aspiration at bedside  07/10/2023 Referral received from Dr Geanie Logan Laryngoscopy revealed "left cord as immobile, right cord does move although there may be some limited abduction."    PAIN:  Are you having pain? No   FALLS: Has patient fallen in last 6 months? No,  LIVING ENVIRONMENT: Lives  with: lives with their spouse Lives in: House/apartment  PLOF: Independent  PATIENT GOALS    to swallow safely and improve vocal quality  SUBJECTIVE STATEMENT: Pt with improved abilities today with less cues Pt accompanied by: significant other  OBJECTIVE:   TODAY'S TREATMENT:  Skilled treatment session focused on pt's dysphonia. SLP facilitated session by providing the following interventions:  Expiratory Muscle Strength Trainer (EMST) - rare supervision cues for slow rate and quick bursts of air to achieve 7 out of 10 with device increased to 75 cmH2O, pt with much improved ability to sequence task - GREAT JOB  Phonation utilizing push and pull: rare Min A provided for sustained phonation using "ah" during push- improved quality and durance of phonation observed when pt holding his head in neutral position and projecting his voice "up and out" not down into his lap  Additional time and instruction provided on abdominal breathing and use of EMST to identify use of respiratory muscles during phonation. With some practice, pt able to engage in abdominal breathing when producing list of functional phrases with improved vocal quality and loudness. While phonation can be heard, it is still c/b hoarse, breathy and somewhat harsh vocal quality but continues to  improve  In conversation, pt was able to carryover the improve respiratory support to achieve improved phonation and loudness.   Skilled observation was also provided of pt consuming thin liquids via bottle. Pt with immediate coughing x each sip. Education provided on likely improved sensory component as pt's aspiration had been silent. Pt stated, "I am still aspirating a lot" to which his wife commented "but you aren't using a spoon." Education provided by this writer that sip observed just now in the session was much larger than that of a spoon.   PATIENT EDUCATION: Education details: see above Person educated: Patient and  Spouse Education method: Explanation Education comprehension: needs further education   HOME EXERCISE PROGRAM: Perform the above 3 times per days 8 times each  GOALS: Goals reviewed with patient? Yes  SHORT TERM GOALS: Target date: 10 sessions  Patient will participate in objective swallowing evaluation (MBSS) to identify safest diet recommendation as well as therapeutic targets.  Baseline: Goal status: INITIAL  2.  With Mod I, pt will demonstrate understanding of result and recommendations of MBS by verbalizing salient points.   Baseline:  Goal status: INITIAL  3.  The patient will maximize voice quality and loudness using breath support/oral resonance for sustained vowel production, pitch glides, and hierarchal speech drill.   Baseline:  Goal status: INITIAL  LONG TERM GOALS: Target date: 09/10/2023  Patient will improve perception of swallowing as indicated by an improvement in EAT-10 score by 12 weeks from initial swallowing therapy session.  Baseline:  Goal status: INITIAL  2.  The patient will be independent for abdominal breathing and breath support exercises.   Baseline:  Goal status: INITIAL  3.  The patient will demonstrate independent understanding of vocal hygiene concepts.   Baseline:  Goal status: INITIAL   ASSESSMENT:  CLINICAL IMPRESSION: Patient is a 77 y.o. male who was seen today for behavioral voice therapy.  Pt presents with improving moderate dysphonia that is c/b hoarse, breathy, low vocal intensity.  In addition he presents with s/s concerning for pharyngeal phase dysphagia when consuming thin liquids. Modified Barium Swallow Study performed on 07/18/2023 with recommendation for thin liquids via spoon.   Pt continues to exhibit improved phonation in both quality and durance! See the above treatment note for details. Pt making great progress.   It is better it is almost   OBJECTIVE IMPAIRMENTS include voice disorder and dysphagia. These  impairments are limiting patient from effectively communicating at home and in community and safety when swallowing. Factors affecting potential to achieve goals and functional outcome are  pt's self-confessed pessimism . Patient will benefit from skilled SLP services to address above impairments and improve overall function.  REHAB POTENTIAL: Fair severity of impairments; pt's self-confused pessimism  PLAN: SLP FREQUENCY: 1-2x/week  SLP DURATION: 8 weeks  PLANNED INTERVENTIONS: SLP instruction and feedback and Patient/family education    Darnice Comrie B. Dreama Saa, M.S., CCC-SLP, Tree surgeon Certified Brain Injury Specialist [facility]  [facility] Rehabilitation Services Office [phone] Ascom [phone] Fax [phone]

## 2023-08-14 DIAGNOSIS — R001 Bradycardia, unspecified: Secondary | ICD-10-CM | POA: Insufficient documentation

## 2023-08-14 DIAGNOSIS — I2510 Atherosclerotic heart disease of native coronary artery without angina pectoris: Secondary | ICD-10-CM | POA: Insufficient documentation

## 2023-08-18 ENCOUNTER — Ambulatory Visit: Payer: Medicare Other | Admitting: Speech Pathology

## 2023-08-18 DIAGNOSIS — R490 Dysphonia: Secondary | ICD-10-CM

## 2023-08-18 DIAGNOSIS — J3801 Paralysis of vocal cords and larynx, unilateral: Secondary | ICD-10-CM

## 2023-08-18 NOTE — Therapy (Signed)
OUTPATIENT SPEECH LANGUAGE PATHOLOGY  TREATMENT NOTE   Patient Name: Adrian Lewis MRN: [id number] DOB:[birth year], 77 y.o., male Today's Date: 08/18/2023  PCP: Dorothey Baseman, MD REFERRING PROVIDER: Geanie Logan, MD   End of Session - 08/18/23 1536     Visit Number 9    Number of Visits 17    Date for SLP Re-Evaluation 09/10/23    Authorization Type Medicare A/Medicare B    Progress Note Due on Visit 10    SLP Start Time 1530    SLP Stop Time  1615    SLP Time Calculation (min) 45 min    Activity Tolerance Patient tolerated treatment well             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   Elevated hemoglobin A1c 08/07/2021   Excessive drinking of alcohol 01/01/2023   01/01/23 - reported 4 hard ciders (4.5% ABV) nightly   Generalized anxiety disorder 06/16/2017   GERD (gastroesophageal reflux disease)    History of hernia repair 05/14/2012   History of kidney stones    Hyperlipidemia, mixed 12/19/2020   Hyponatremia 10/08/2013   Hypothyroidism    Insomnia due to medical condition 06/16/2017   Macular degeneration    Major depressive disorder    Medication overdose 06/16/2017   Mild cognitive impairment of uncertain or unknown etiology 01/01/2023   Nephrolithiasis 01/25/2020   Obstructive sleep apnea 10/07/2013   Osteopenia 04/04/2014   Paresthesia of both feet 12/03/2022   Radiculopathy 02/24/2013   Rising PSA level 08/07/2021   Shortness of breath on exertion 12/19/2020   Tremor 03/19/2022   Past Surgical History:  Procedure Laterality Date   CATARACT EXTRACTION W/ INTRAOCULAR LENS IMPLANT  2011 (APPROX)   RIGHT EYE AND REPAIR DETACHED RETINA   CYSTOSCOPY WITH RETROGRADE PYELOGRAM,  URETEROSCOPY AND STENT PLACEMENT Bilateral 02/23/2021   Procedure: CYSTOSCOPY WITH RETROGRADE PYELOGRAM, URETEROSCOPY AND STENT PLACEMENT;  Surgeon: Sebastian Ache, MD;  Location: [facility];  Service: Urology;  Laterality: Bilateral;  75 MINS   EYE SURGERY     HAMMER TOE SURGERY  2012  &  2010  (APPROX)   ONE TOE , EACH FOOT   HERNIA REPAIR Bilateral    Inguinal Hernia Repair   HOLMIUM LASER APPLICATION Bilateral 02/23/2021   Procedure: HOLMIUM LASER APPLICATION;  Surgeon: Sebastian Ache, MD;  Location: [facility];  Service: Urology;  Laterality: Bilateral;   JOINT REPLACEMENT Left 2014   Partial Knee Replacement   KNEE SURGERY Left    x 2  partial and reconstructive   NASAL TURBINATE REDUCTION Bilateral 09/27/2015   Procedure: TURBINATE REDUCTION/SUBMUCOSAL RESECTION;  Surgeon: Geanie Logan, MD;  Location: [facility];  Service: ENT;  Laterality: Bilateral;   REPAIR RIGHT INGUINAL HERNIA W/ MESH  02-14-2006   SEPTOPLASTY N/A 09/27/2015   Procedure: SEPTOPLASTY;  Surgeon: Geanie Logan, MD;  Location: [facility];  Service: ENT;  Laterality: N/A;   URETEROSCOPY  08/12/2012   Procedure: URETEROSCOPY;  Surgeon: Sebastian Ache, MD;  Location: [facility];  Service: Urology;  Laterality: Left;  1 HR    Patient Active Problem List   Diagnosis Date Noted   Neuropathic pain of both feet 04/15/2023   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   Chronic pain syndrome  04/15/2023   Mild cognitive impairment 01/01/2023   Excessive drinking of alcohol 01/01/2023   Major depressive disorder 12/31/2022   GERD (gastroesophageal reflux disease) 12/31/2022   Macular degeneration 12/31/2022   Paresthesia of both feet 12/03/2022   Tremor 03/19/2022   Circadian rhythm sleep disorder, delayed sleep phase type 01/21/2022   Bipolar disorder II, in full remission, most recent episode depressed 01/21/2022   High risk medication use 01/21/2022   Elevated hemoglobin A1c 08/07/2021   Rising PSA  level 08/07/2021   Hyperlipidemia, mixed 12/19/2020   Benign essential hypertension 12/19/2020   Abnormal ECG 12/19/2020   Shortness of breath on exertion 12/19/2020   Nephrolithiasis 01/25/2020   Medication overdose 06/16/2017   Insomnia due to medical condition 06/16/2017   Generalized anxiety disorder 06/16/2017   Osteopenia 04/04/2014   Hyponatremia 10/08/2013   Obstructive sleep apnea 10/07/2013   Hypothyroidism 10/07/2013   Bilateral sciatica 10/07/2013   Back pain 02/24/2013   Radiculopathy 02/24/2013   History of hernia repair 05/14/2012    ONSET DATE:  03/27/2023; date of referral 07/10/2023  REFERRING DIAG: 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 Laryngoscopy revealed hypomobility of right vocal cord and complete immobility of left vocal cord.   Modified Barium Swallow Study 05/12/2023 No aspiration observed, continue regular diet with thin liquids  06/26/2023 Left hemithyroidectomy  Bedside Swallow Evaluation Recommend continue regular diet with thin liquids, head turn to pt's left to reduce s/s of aspiration at bedside  07/10/2023 Referral received from Dr Geanie Logan Laryngoscopy revealed "left cord as immobile, right cord does move although there may be some limited abduction."    PAIN:  Are you having pain? No   FALLS: Has patient fallen in last 6 months? No,  LIVING ENVIRONMENT: Lives with: lives with their  spouse Lives in: House/apartment  PLOF: Independent  PATIENT GOALS    to swallow safely and improve vocal quality  SUBJECTIVE STATEMENT: Pt with improved phonation as we were walking down the hallway, improved  Pt accompanied by: significant other  OBJECTIVE:   TODAY'S TREATMENT:  Skilled treatment session focused on pt's dysphonia. SLP facilitated session by providing the following interventions:  Expiratory Muscle Strength Trainer (EMST) - rare supervision cues for slow rate and quick bursts of air to achieve 7 out of 10 with device increased to 75 cmH2O, pt with much improved ability to sequence task - GREAT JOB  Phonation utilizing push and pull: Minimal assistance provided to "project" his voice to improve vocal quality - additional time and education targeting improved respiratory support and projecting his voice  In conversation, pt was able to carryover the improve respiratory support to achieve improved phonation and loudness.    PATIENT EDUCATION: Education details: see above Person educated: Patient and Spouse Education method: Explanation Education comprehension: needs further education   HOME EXERCISE PROGRAM: Perform the above 3 times per days 8 times each  GOALS: Goals reviewed with patient? Yes  SHORT TERM GOALS: Target date: 10 sessions  Patient will participate in  objective swallowing evaluation (MBSS) to identify safest diet recommendation as well as therapeutic targets.  Baseline: Goal status: INITIAL  2.  With Mod I, pt will demonstrate understanding of result and recommendations of MBS by verbalizing salient points.   Baseline:  Goal status: INITIAL  3.  The patient will maximize voice quality and loudness using breath support/oral resonance for sustained vowel production, pitch glides, and hierarchal speech drill.   Baseline:  Goal status: INITIAL  LONG TERM GOALS: Target date: 09/10/2023  Patient will improve perception of swallowing as  indicated by an improvement in EAT-10 score by 12 weeks from initial swallowing therapy session.  Baseline:  Goal status: INITIAL  2.  The patient will be independent for abdominal breathing and breath support exercises.   Baseline:  Goal status: INITIAL  3.  The patient will demonstrate independent understanding of vocal hygiene concepts.   Baseline:  Goal status: INITIAL   ASSESSMENT:  CLINICAL IMPRESSION: Patient is a 77 y.o. male who was seen today for behavioral voice therapy.  Pt presents with improving moderate dysphonia that is c/b hoarse, breathy, low vocal intensity.  In addition he presents with s/s concerning for pharyngeal phase dysphagia when consuming thin liquids. Modified Barium Swallow Study performed on 07/18/2023 with recommendation for thin liquids via spoon.   Pt continues to exhibit improved phonation in both quality and durance! See the above treatment note for details.   OBJECTIVE IMPAIRMENTS include voice disorder and dysphagia. These impairments are limiting patient from effectively communicating at home and in community and safety when swallowing. Factors affecting potential to achieve goals and functional outcome are  pt's self-confessed pessimism . Patient will benefit from skilled SLP services to address above impairments and improve overall function.  REHAB POTENTIAL: Fair severity of impairments; pt's self-confused pessimism  PLAN: SLP FREQUENCY: 1-2x/week  SLP DURATION: 8 weeks  PLANNED INTERVENTIONS: SLP instruction and feedback and Patient/family education    Ho Parisi B. Dreama Saa, M.S., CCC-SLP, Tree surgeon Certified Brain Injury Specialist [facility]  [facility] Rehabilitation Services Office [phone] Ascom [phone] Fax [phone]

## 2023-08-20 ENCOUNTER — Ambulatory Visit: Payer: Medicare Other | Admitting: Speech Pathology

## 2023-08-20 DIAGNOSIS — R490 Dysphonia: Secondary | ICD-10-CM

## 2023-08-20 DIAGNOSIS — J3801 Paralysis of vocal cords and larynx, unilateral: Secondary | ICD-10-CM

## 2023-08-20 NOTE — Therapy (Signed)
OUTPATIENT SPEECH LANGUAGE PATHOLOGY  TREATMENT NOTE 10th VISIT TREATMENT NOTE   Patient Name: Adrian Lewis MRN: [id number] DOB:11/10/1946, 77 y.o., male Today's Date: 08/20/2023  PCP: Dorothey Baseman, MD REFERRING PROVIDER: Geanie Logan, MD  Speech Therapy Progress Note  Dates of Reporting Period: 07/16/2023 to 08/20/2023  Objective: Patient has been seen for 10 speech therapy sessions this reporting period targeting moderate to severe dysphonia. Patient is making progress toward LTGs and met 3 STGs this reporting period. See skilled intervention, clinical impressions, and goals below for details.     End of Session - 08/20/23 0943     Visit Number 10    Number of Visits 17    Date for SLP Re-Evaluation 09/10/23    Authorization Type Medicare A/Medicare B    Progress Note Due on Visit 10    SLP Start Time 0930    SLP Stop Time  1015    SLP Time Calculation (min) 45 min    Activity Tolerance Patient tolerated treatment well             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   Elevated hemoglobin A1c 08/07/2021   Excessive drinking of alcohol 01/01/2023   01/01/23 - reported 4 hard ciders (4.5% ABV) nightly   Generalized anxiety disorder 06/16/2017   GERD (gastroesophageal reflux disease)    History of hernia repair 05/14/2012   History of kidney stones    Hyperlipidemia, mixed 12/19/2020   Hyponatremia 10/08/2013   Hypothyroidism    Insomnia due to medical condition 06/16/2017   Macular degeneration    Major depressive disorder    Medication overdose 06/16/2017   Mild cognitive impairment of uncertain or unknown etiology 01/01/2023   Nephrolithiasis 01/25/2020   Obstructive sleep apnea  10/07/2013   Osteopenia 04/04/2014   Paresthesia of both feet 12/03/2022   Radiculopathy 02/24/2013   Rising PSA level 08/07/2021   Shortness of breath on exertion 12/19/2020   Tremor 03/19/2022   Past Surgical History:  Procedure Laterality Date   CATARACT EXTRACTION W/ INTRAOCULAR LENS IMPLANT  2011 (APPROX)   RIGHT EYE AND REPAIR DETACHED RETINA   CYSTOSCOPY WITH RETROGRADE PYELOGRAM, URETEROSCOPY AND STENT PLACEMENT Bilateral 02/23/2021   Procedure: CYSTOSCOPY WITH RETROGRADE PYELOGRAM, URETEROSCOPY AND STENT PLACEMENT;  Surgeon: Sebastian Ache, MD;  Location: [facility];  Service: Urology;  Laterality: Bilateral;  75 MINS   EYE SURGERY     HAMMER TOE SURGERY  2012  &  2010  (APPROX)   ONE TOE , EACH FOOT   HERNIA REPAIR Bilateral    Inguinal Hernia Repair   HOLMIUM LASER APPLICATION Bilateral 02/23/2021   Procedure: HOLMIUM LASER APPLICATION;  Surgeon: Sebastian Ache, MD;  Location: [facility];  Service: Urology;  Laterality: Bilateral;   JOINT REPLACEMENT Left 2014   Partial Knee Replacement   KNEE SURGERY Left    x 2  partial and reconstructive   NASAL TURBINATE REDUCTION Bilateral 09/27/2015   Procedure: TURBINATE REDUCTION/SUBMUCOSAL RESECTION;  Surgeon: Geanie Logan, MD;  Location: [facility];  Service: ENT;  Laterality: Bilateral;   REPAIR RIGHT INGUINAL HERNIA W/ MESH  02-14-2006   SEPTOPLASTY N/A 09/27/2015   Procedure: SEPTOPLASTY;  Surgeon: Geanie Logan, MD;  Location: [facility];  Service: ENT;  Laterality: N/A;  URETEROSCOPY  08/12/2012   Procedure: URETEROSCOPY;  Surgeon: Sebastian Ache, MD;  Location: [facility];  Service: Urology;  Laterality: Left;  1 HR    Patient Active Problem List   Diagnosis Date Noted   Neuropathic pain of both feet 04/15/2023   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   Chronic pain syndrome 04/15/2023   Mild cognitive impairment 01/01/2023   Excessive drinking of alcohol 01/01/2023   Major depressive disorder  12/31/2022   GERD (gastroesophageal reflux disease) 12/31/2022   Macular degeneration 12/31/2022   Paresthesia of both feet 12/03/2022   Tremor 03/19/2022   Circadian rhythm sleep disorder, delayed sleep phase type 01/21/2022   Bipolar disorder II, in full remission, most recent episode depressed 01/21/2022   High risk medication use 01/21/2022   Elevated hemoglobin A1c 08/07/2021   Rising PSA level 08/07/2021   Hyperlipidemia, mixed 12/19/2020   Benign essential hypertension 12/19/2020   Abnormal ECG 12/19/2020   Shortness of breath on exertion 12/19/2020   Nephrolithiasis 01/25/2020   Medication overdose 06/16/2017   Insomnia due to medical condition 06/16/2017   Generalized anxiety disorder 06/16/2017   Osteopenia 04/04/2014   Hyponatremia 10/08/2013   Obstructive sleep apnea 10/07/2013   Hypothyroidism 10/07/2013   Bilateral sciatica 10/07/2013   Back pain 02/24/2013   Radiculopathy 02/24/2013   History of hernia repair 05/14/2012    ONSET DATE:  03/27/2023; date of referral 07/10/2023  REFERRING DIAG: 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 Laryngoscopy revealed hypomobility of right vocal cord and complete immobility of left vocal cord.   Modified Barium Swallow Study 05/12/2023 No aspiration observed, continue regular diet with thin liquids  06/26/2023 Left hemithyroidectomy  Bedside Swallow Evaluation Recommend continue regular  diet with thin liquids, head turn to pt's left to reduce s/s of aspiration at bedside  07/10/2023 Referral received from Dr Geanie Logan Laryngoscopy revealed "left cord as immobile, right cord does move although there may be some limited abduction."    PAIN:  Are you having pain? No   FALLS: Has patient fallen in last 6 months? No,  LIVING ENVIRONMENT: Lives with: lives with their spouse Lives in: House/apartment  PLOF: Independent  PATIENT GOALS    to swallow safely and improve vocal quality  SUBJECTIVE STATEMENT: Pt and his wife brought in [place] fruit for this writer Pt accompanied by: significant other  OBJECTIVE:   TODAY'S TREATMENT:  Skilled treatment session focused on pt's dysphonia. SLP facilitated session by providing the following interventions:  Expiratory Muscle Strength Trainer (EMST) - rare supervision cues for slow rate and quick bursts of air to achieve 7 out of 10 with device increased to 75 cmH2O, pt with much improved ability to sequence task - GREAT JOB  Phonation utilizing push and pull: Minimal assistance provided to "project" his voice to improve vocal quality - additional time and education targeting improved respiratory support and projecting his voice  In conversation, pt was able to carryover the improved respiratory support to achieve improved phonation and  loudness. He benefited from cues to "push with your stomach"   PATIENT EDUCATION: Education details: see above Person educated: Patient and Spouse Education method: Explanation Education comprehension: needs further education   HOME EXERCISE PROGRAM: Perform the above 3 times per days 8 times each  GOALS: Goals reviewed with patient? Yes  SHORT TERM GOALS: Target date: 10 sessions Updated: 08/20/2023 Patient will participate in objective swallowing evaluation (MBSS) to identify safest diet recommendation as well as therapeutic targets.  Baseline: Goal status: INITIAL: MET  2.   With Mod I, pt will demonstrate understanding of result and recommendations of MBS by verbalizing salient points.   Baseline:  Goal status: INITIAL: Goal met as pt is reliant on his wife for understanding d/t Mild Cognitive Impairment  3.  The patient will maximize voice quality and loudness using breath support/oral resonance for sustained vowel production, pitch glides, and hierarchal speech drill.   Baseline:  Goal status: INITIAL; MET- upgraded to The patient will maximize voice quality and loudness using breath support/oral resonance for sentence level utterances with Min A.    LONG TERM GOALS: Target date: 09/10/2023 Updated:08/20/2023 Patient will improve perception of swallowing as indicated by an improvement in EAT-10 score by 12 weeks from initial swallowing therapy session.  Baseline:  Goal status: INITIAL: Ongoing  2.  The patient will be independent for abdominal breathing and breath support exercises.   Baseline:  Goal status: INITIAL:Ongoing  3.  The patient will demonstrate independent understanding of vocal hygiene concepts.   Baseline:  Goal status: INITIAL: Ongoing  4.  Patient will increase MEP by 10% indicating improved breath support, cough function, and respiratory strength for adequate speech function.   ASSESSMENT:  CLINICAL IMPRESSION: Patient is a 77 y.o. male who was seen today for behavioral voice therapy.  Pt presents with improving moderate dysphonia that is c/b hoarse, breathy, low vocal intensity.  In addition he presents with s/s concerning for pharyngeal phase dysphagia when consuming thin liquids. Modified Barium Swallow Study performed on 07/18/2023 with recommendation for thin liquids via spoon.   Pt continues to exhibit improved phonation in both quality and durance! See the above treatment note for details.   OBJECTIVE IMPAIRMENTS include voice disorder and dysphagia. These impairments are limiting patient from effectively communicating at  home and in community and safety when swallowing. Factors affecting potential to achieve goals and functional outcome are  pt's self-confessed pessimism . Patient will benefit from skilled SLP services to address above impairments and improve overall function.  REHAB POTENTIAL: Fair severity of impairments; pt's self-confused pessimism  PLAN: SLP FREQUENCY: 1-2x/week  SLP DURATION: 8 weeks  PLANNED INTERVENTIONS: SLP instruction and feedback and Patient/family education    Denee Boeder B. Dreama Saa, M.S., CCC-SLP, Tree surgeon Certified Brain Injury Specialist [facility]  [facility] Rehabilitation Services Office [phone] Ascom [phone] Fax [phone]

## 2023-08-25 ENCOUNTER — Ambulatory Visit: Payer: Medicare Other | Admitting: Speech Pathology

## 2023-08-25 DIAGNOSIS — R490 Dysphonia: Secondary | ICD-10-CM | POA: Diagnosis not present

## 2023-08-25 DIAGNOSIS — J3801 Paralysis of vocal cords and larynx, unilateral: Secondary | ICD-10-CM

## 2023-08-25 NOTE — Therapy (Signed)
OUTPATIENT SPEECH LANGUAGE PATHOLOGY  TREATMENT NOTE   Patient Name: Adrian Lewis MRN: [id number] DOB:1946-10-19, 77 y.o., male Today's Date: 08/25/2023  PCP: Dorothey Baseman, MD REFERRING PROVIDER: Geanie Logan, MD    End of Session - 08/25/23 1530     Visit Number 11    Number of Visits 17    Date for SLP Re-Evaluation 09/10/23    Authorization Type Medicare A/Medicare B    Progress Note Due on Visit 20    SLP Start Time 1530    SLP Stop Time  1615    SLP Time Calculation (min) 45 min    Activity Tolerance Patient tolerated treatment well             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   Elevated hemoglobin A1c 08/07/2021   Excessive drinking of alcohol 01/01/2023   01/01/23 - reported 4 hard ciders (4.5% ABV) nightly   Generalized anxiety disorder 06/16/2017   GERD (gastroesophageal reflux disease)    History of hernia repair 05/14/2012   History of kidney stones    Hyperlipidemia, mixed 12/19/2020   Hyponatremia 10/08/2013   Hypothyroidism    Insomnia due to medical condition 06/16/2017   Macular degeneration    Major depressive disorder    Medication overdose 06/16/2017   Mild cognitive impairment of uncertain or unknown etiology 01/01/2023   Nephrolithiasis 01/25/2020   Obstructive sleep apnea 10/07/2013   Osteopenia 04/04/2014   Paresthesia of both feet 12/03/2022   Radiculopathy 02/24/2013   Rising PSA level 08/07/2021   Shortness of breath on exertion 12/19/2020   Tremor 03/19/2022   Past Surgical History:  Procedure Laterality Date   CATARACT EXTRACTION W/ INTRAOCULAR LENS IMPLANT  2011 (APPROX)   RIGHT EYE AND REPAIR DETACHED RETINA   CYSTOSCOPY WITH RETROGRADE PYELOGRAM,  URETEROSCOPY AND STENT PLACEMENT Bilateral 02/23/2021   Procedure: CYSTOSCOPY WITH RETROGRADE PYELOGRAM, URETEROSCOPY AND STENT PLACEMENT;  Surgeon: Sebastian Ache, MD;  Location: [facility];  Service: Urology;  Laterality: Bilateral;  75 MINS   EYE SURGERY     HAMMER TOE SURGERY  2012  &  2010  (APPROX)   ONE TOE , EACH FOOT   HERNIA REPAIR Bilateral    Inguinal Hernia Repair   HOLMIUM LASER APPLICATION Bilateral 02/23/2021   Procedure: HOLMIUM LASER APPLICATION;  Surgeon: Sebastian Ache, MD;  Location: [facility];  Service: Urology;  Laterality: Bilateral;   JOINT REPLACEMENT Left 2014   Partial Knee Replacement   KNEE SURGERY Left    x 2  partial and reconstructive   NASAL TURBINATE REDUCTION Bilateral 09/27/2015   Procedure: TURBINATE REDUCTION/SUBMUCOSAL RESECTION;  Surgeon: Geanie Logan, MD;  Location: [facility];  Service: ENT;  Laterality: Bilateral;   REPAIR RIGHT INGUINAL HERNIA W/ MESH  02-14-2006   SEPTOPLASTY N/A 09/27/2015   Procedure: SEPTOPLASTY;  Surgeon: Geanie Logan, MD;  Location: [facility];  Service: ENT;  Laterality: N/A;   URETEROSCOPY  08/12/2012   Procedure: URETEROSCOPY;  Surgeon: Sebastian Ache, MD;  Location: [facility];  Service: Urology;  Laterality: Left;  1 HR    Patient Active Problem List   Diagnosis Date Noted   Neuropathic pain of both feet 04/15/2023   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   Chronic pain  syndrome 04/15/2023   Mild cognitive impairment 01/01/2023   Excessive drinking of alcohol 01/01/2023   Major depressive disorder 12/31/2022   GERD (gastroesophageal reflux disease) 12/31/2022   Macular degeneration 12/31/2022   Paresthesia of both feet 12/03/2022   Tremor 03/19/2022   Circadian rhythm sleep disorder, delayed sleep phase type 01/21/2022   Bipolar disorder II, in full remission, most recent episode depressed 01/21/2022   High risk medication use 01/21/2022   Elevated hemoglobin A1c 08/07/2021   Rising PSA  level 08/07/2021   Hyperlipidemia, mixed 12/19/2020   Benign essential hypertension 12/19/2020   Abnormal ECG 12/19/2020   Shortness of breath on exertion 12/19/2020   Nephrolithiasis 01/25/2020   Medication overdose 06/16/2017   Insomnia due to medical condition 06/16/2017   Generalized anxiety disorder 06/16/2017   Osteopenia 04/04/2014   Hyponatremia 10/08/2013   Obstructive sleep apnea 10/07/2013   Hypothyroidism 10/07/2013   Bilateral sciatica 10/07/2013   Back pain 02/24/2013   Radiculopathy 02/24/2013   History of hernia repair 05/14/2012    ONSET DATE:  03/27/2023; date of referral 07/10/2023  REFERRING DIAG: 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 Laryngoscopy revealed hypomobility of right vocal cord and complete immobility of left vocal cord.   Modified Barium Swallow Study 05/12/2023 No aspiration observed, continue regular diet with thin liquids  06/26/2023 Left hemithyroidectomy  Bedside Swallow Evaluation Recommend continue regular diet with thin liquids, head turn to pt's left to reduce s/s of aspiration at bedside  07/10/2023 Referral received from Dr Geanie Logan Laryngoscopy revealed "left cord as immobile, right cord does move although there may be some limited abduction."    PAIN:  Are you having pain? No   FALLS: Has patient fallen in last 6 months? No,  LIVING ENVIRONMENT: Lives with: lives with their  spouse Lives in: House/apartment  PLOF: Independent  PATIENT GOALS    to swallow safely and improve vocal quality  SUBJECTIVE STATEMENT: Pt with improved conversational voice during today's session Pt accompanied by: significant other  OBJECTIVE:   TODAY'S TREATMENT:  Skilled treatment session focused on pt's dysphonia. SLP facilitated session by providing the following interventions:  Expiratory Muscle Strength Trainer (EMST) - rare supervision cues for slow rate and quick bursts of air to achieve 7 out of 10 with device increased to 75 cmH2O, pt with much improved ability to sequence task - GREAT JOB, continued to demonstrate independence with sequence of activity  Phonation utilizing push and pull: Minimal assistance provided to "project" his voice to improve vocal quality - continued additional time and education targeting improved respiratory support and projecting his voice when vocalizing rote utterances  In conversation, pt was able to carryover the improved respiratory support to achieve improved phonation and loudness.    PATIENT EDUCATION: Education details: see above Person educated: Patient and Spouse Education method: Explanation Education comprehension: needs further education   HOME EXERCISE PROGRAM: Perform the above 3 times per days 8 times each  GOALS: Goals reviewed with patient? Yes  SHORT TERM GOALS:  Target date: 10 sessions Updated: 08/20/2023 Patient will participate in objective swallowing evaluation (MBSS) to identify safest diet recommendation as well as therapeutic targets.  Baseline: Goal status: INITIAL: MET  2.  With Mod I, pt will demonstrate understanding of result and recommendations of MBS by verbalizing salient points.   Baseline:  Goal status: INITIAL: Goal met as pt is reliant on his wife for understanding d/t Mild Cognitive Impairment  3.  The patient will maximize voice quality and loudness using breath support/oral resonance for  sustained vowel production, pitch glides, and hierarchal speech drill.   Baseline:  Goal status: INITIAL; MET- upgraded to The patient will maximize voice quality and loudness using breath support/oral resonance for sentence level utterances with Min A.    LONG TERM GOALS: Target date: 09/10/2023 Updated:08/20/2023 Patient will improve perception of swallowing as indicated by an improvement in EAT-10 score by 12 weeks from initial swallowing therapy session.  Baseline:  Goal status: INITIAL: Ongoing  2.  The patient will be independent for abdominal breathing and breath support exercises.   Baseline:  Goal status: INITIAL:Ongoing  3.  The patient will demonstrate independent understanding of vocal hygiene concepts.   Baseline:  Goal status: INITIAL: Ongoing  4.  Patient will increase MEP by 10% indicating improved breath support, cough function, and respiratory strength for adequate speech function.   ASSESSMENT:  CLINICAL IMPRESSION: Patient is a 77 y.o. male who was seen today for behavioral voice therapy.  Pt presents with improving moderate dysphonia that is c/b hoarse, breathy, low vocal intensity.  In addition he presents with s/s concerning for pharyngeal phase dysphagia when consuming thin liquids. Modified Barium Swallow Study performed on 07/18/2023 with recommendation for thin liquids via spoon.   Pt continues to exhibit improved phonation in both quality and durance! See the above treatment note for details.   OBJECTIVE IMPAIRMENTS include voice disorder and dysphagia. These impairments are limiting patient from effectively communicating at home and in community and safety when swallowing. Factors affecting potential to achieve goals and functional outcome are  pt's self-confessed pessimism . Patient will benefit from skilled SLP services to address above impairments and improve overall function.  REHAB POTENTIAL: Fair severity of impairments; pt's self-confused  pessimism  PLAN: SLP FREQUENCY: 1-2x/week  SLP DURATION: 8 weeks  PLANNED INTERVENTIONS: SLP instruction and feedback and Patient/family education    Essica Kiker B. Dreama Saa, M.S., CCC-SLP, Tree surgeon Certified Brain Injury Specialist [facility]  [facility] Office [phone] Ascom [phone] Fax [phone]

## 2023-08-26 ENCOUNTER — Other Ambulatory Visit: Payer: Self-pay | Admitting: Psychiatry

## 2023-08-27 ENCOUNTER — Ambulatory Visit: Payer: Medicare Other | Attending: Otolaryngology | Admitting: Speech Pathology

## 2023-08-27 DIAGNOSIS — J3801 Paralysis of vocal cords and larynx, unilateral: Secondary | ICD-10-CM | POA: Diagnosis present

## 2023-08-27 DIAGNOSIS — R490 Dysphonia: Secondary | ICD-10-CM | POA: Insufficient documentation

## 2023-08-27 LAB — LITHIUM LEVEL: Lithium Lvl: 0.5 mmol/L (ref 0.5–1.2)

## 2023-08-27 NOTE — Therapy (Signed)
OUTPATIENT SPEECH LANGUAGE PATHOLOGY  TREATMENT NOTE   Patient Name: Adrian Lewis MRN: [id number] DOB:September 01, 1946, 77 y.o., male Today's Date: 08/27/2023  PCP: Dorothey Baseman, MD REFERRING PROVIDER: Geanie Logan, MD    End of Session - 08/27/23 1448     Visit Number 12    Number of Visits 17    Date for SLP Re-Evaluation 09/10/23    Authorization Type Medicare A/Medicare B    Progress Note Due on Visit 20    SLP Start Time 1445    SLP Stop Time  1530    SLP Time Calculation (min) 45 min    Activity Tolerance Patient tolerated treatment well             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   Elevated hemoglobin A1c 08/07/2021   Excessive drinking of alcohol 01/01/2023   01/01/23 - reported 4 hard ciders (4.5% ABV) nightly   Generalized anxiety disorder 06/16/2017   GERD (gastroesophageal reflux disease)    History of hernia repair 05/14/2012   History of kidney stones    Hyperlipidemia, mixed 12/19/2020   Hyponatremia 10/08/2013   Hypothyroidism    Insomnia due to medical condition 06/16/2017   Macular degeneration    Major depressive disorder    Medication overdose 06/16/2017   Mild cognitive impairment of uncertain or unknown etiology 01/01/2023   Nephrolithiasis 01/25/2020   Obstructive sleep apnea 10/07/2013   Osteopenia 04/04/2014   Paresthesia of both feet 12/03/2022   Radiculopathy 02/24/2013   Rising PSA level 08/07/2021   Shortness of breath on exertion 12/19/2020   Tremor 03/19/2022   Past Surgical History:  Procedure Laterality Date   CATARACT EXTRACTION W/ INTRAOCULAR LENS IMPLANT  2011 (APPROX)   RIGHT EYE AND REPAIR DETACHED RETINA   CYSTOSCOPY WITH RETROGRADE PYELOGRAM,  URETEROSCOPY AND STENT PLACEMENT Bilateral 02/23/2021   Procedure: CYSTOSCOPY WITH RETROGRADE PYELOGRAM, URETEROSCOPY AND STENT PLACEMENT;  Surgeon: Sebastian Ache, MD;  Location: [facility];  Service: Urology;  Laterality: Bilateral;  75 MINS   EYE SURGERY     HAMMER TOE SURGERY  2012  &  2010  (APPROX)   ONE TOE , EACH FOOT   HERNIA REPAIR Bilateral    Inguinal Hernia Repair   HOLMIUM LASER APPLICATION Bilateral 02/23/2021   Procedure: HOLMIUM LASER APPLICATION;  Surgeon: Sebastian Ache, MD;  Location: [facility];  Service: Urology;  Laterality: Bilateral;   JOINT REPLACEMENT Left 2014   Partial Knee Replacement   KNEE SURGERY Left    x 2  partial and reconstructive   NASAL TURBINATE REDUCTION Bilateral 09/27/2015   Procedure: TURBINATE REDUCTION/SUBMUCOSAL RESECTION;  Surgeon: Geanie Logan, MD;  Location: [facility];  Service: ENT;  Laterality: Bilateral;   REPAIR RIGHT INGUINAL HERNIA W/ MESH  02-14-2006   SEPTOPLASTY N/A 09/27/2015   Procedure: SEPTOPLASTY;  Surgeon: Geanie Logan, MD;  Location: [facility];  Service: ENT;  Laterality: N/A;   URETEROSCOPY  08/12/2012   Procedure: URETEROSCOPY;  Surgeon: Sebastian Ache, MD;  [facility];  Service: Urology;  Laterality: Left;  1 HR    Patient Active Problem List   Diagnosis Date Noted   Neuropathic pain of both feet 04/15/2023   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   Chronic pain  syndrome 04/15/2023   Mild cognitive impairment 01/01/2023   Excessive drinking of alcohol 01/01/2023   Major depressive disorder 12/31/2022   GERD (gastroesophageal reflux disease) 12/31/2022   Macular degeneration 12/31/2022   Paresthesia of both feet 12/03/2022   Tremor 03/19/2022   Circadian rhythm sleep disorder, delayed sleep phase type 01/21/2022   Bipolar disorder II, in full remission, most recent episode depressed 01/21/2022   High risk medication use 01/21/2022   Elevated hemoglobin A1c 08/07/2021   Rising PSA  level 08/07/2021   Hyperlipidemia, mixed 12/19/2020   Benign essential hypertension 12/19/2020   Abnormal ECG 12/19/2020   Shortness of breath on exertion 12/19/2020   Nephrolithiasis 01/25/2020   Medication overdose 06/16/2017   Insomnia due to medical condition 06/16/2017   Generalized anxiety disorder 06/16/2017   Osteopenia 04/04/2014   Hyponatremia 10/08/2013   Obstructive sleep apnea 10/07/2013   Hypothyroidism 10/07/2013   Bilateral sciatica 10/07/2013   Back pain 02/24/2013   Radiculopathy 02/24/2013   History of hernia repair 05/14/2012    ONSET DATE:  03/27/2023; date of referral 07/10/2023  REFERRING DIAG: 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 Laryngoscopy revealed hypomobility of right vocal cord and complete immobility of left vocal cord.   Modified Barium Swallow Study 05/12/2023 No aspiration observed, continue regular diet with thin liquids  06/26/2023 Left hemithyroidectomy  Bedside Swallow Evaluation Recommend continue regular diet with thin liquids, head turn to pt's left to reduce s/s of aspiration at bedside  07/10/2023 Referral received from Dr Geanie Logan Laryngoscopy revealed "left cord as immobile, right cord does move although there may be some limited abduction."    PAIN:  Are you having pain? No   FALLS: Has patient fallen in last 6 months? No,  LIVING ENVIRONMENT: Lives with: lives with their  spouse Lives in: House/apartment  PLOF: Independent  PATIENT GOALS    to swallow safely and improve vocal quality  SUBJECTIVE STATEMENT: Pt with improved conversational voice during today's session Pt accompanied by: significant other  OBJECTIVE:   TODAY'S TREATMENT:  Skilled treatment session focused on pt's dysphonia. SLP facilitated session by providing the following interventions:  Expiratory Muscle Strength Trainer (EMST) - rare supervision cues for slow rate and quick bursts of air to achieve 7 out of 10 with device increased to 90 cmH2O, pt with much improved ability to sequence task - GREAT JOB, continued to demonstrate independence with sequence of activity   VOICE HANDICAP INDEX (VHI)  The Voice Handicap Index is comprised of a series of questions to assess the patient's perception of their voice. It is designed to evaluate the emotional, physical and functional components of the voice problem.  Functional: 20 Physical: 20 Emotional: 15 Total: 55 (Normal mean 8.75, SD =14.97)  z score =  3.08, severe = 3.00+ - Significant improvement over previous Total Score of 109; z score of 3.7 (07/16/2023)    EATING ASSESSMENT TOOL (EAT-10)   The patient was asked to rate to what extent the following statements are problematic  on a scale of 0-4. 0 = No problem; 4 = Severe problem. A total score of 3 or higher is considered abnormal for each item. Subjective dsyphagia symptoms can predict aspiration risk: as score of 15 or more indicates the patient is 2.2 times more likely to aspirate Tim Lair et al, 2015)  1.) My swallowing problem has caused me to lose weight. 0 2.) My swallowing problem interferes with my ability to go out for meals. 0 3.) Swallowing liquids takes extra effort. 4  4.) Swallowing solids takes extra effort. 2  5.) Swallowing pills takes extra effort. 2 6.) Swallowing is painful. 0 7.) The pleasure of eating is affected by my swallowing. 1 8.) When I swallow  food sticks in my throat. 2 9.) I cough when I eat. 2 10.) Swallowing is stressful. 2   TOTAL SCORE: 15   PATIENT EDUCATION: Education details: see above Person educated: Patient and Spouse Education method: Explanation Education comprehension: needs further education   HOME EXERCISE PROGRAM: Perform the above 3 times per days 8 times each  GOALS: Goals reviewed with patient? Yes  SHORT TERM GOALS: Target date: 10 sessions Updated: 08/20/2023 Patient will participate in objective swallowing evaluation (MBSS) to identify safest diet recommendation as well as therapeutic targets.  Baseline: Goal status: INITIAL: MET  2.  With Mod I, pt will demonstrate understanding of result and recommendations of MBS by verbalizing salient points.   Baseline:  Goal status: INITIAL: Goal met as pt is reliant on his wife for understanding d/t Mild Cognitive Impairment  3.  The patient will maximize voice quality and loudness using breath support/oral resonance for sustained vowel production, pitch glides, and hierarchal speech drill.   Baseline:  Goal status: INITIAL; MET- upgraded to The patient will maximize voice quality and loudness using breath support/oral resonance for sentence level utterances with Min A.    LONG TERM GOALS: Target date: 09/10/2023 Updated:08/20/2023 Patient will improve perception of swallowing as indicated by an improvement in EAT-10 score by 12 weeks from initial swallowing therapy session.  Baseline:  Goal status: INITIAL: Ongoing  2.  The patient will be independent for abdominal breathing and breath support exercises.   Baseline:  Goal status: INITIAL:Ongoing  3.  The patient will demonstrate independent understanding of vocal hygiene concepts.   Baseline:  Goal status: INITIAL: Ongoing  4.  Patient will increase MEP by 10% indicating improved breath support, cough function, and respiratory strength for adequate speech  function.   ASSESSMENT:  CLINICAL IMPRESSION: Patient is a 77 y.o. male who was seen today for behavioral voice therapy.  Pt presents with improving moderate dysphonia that is c/b hoarse, breathy, low vocal intensity.  In addition he presents with s/s concerning for pharyngeal phase dysphagia when consuming thin liquids. Modified Barium Swallow Study performed on 07/18/2023 with recommendation for thin liquids via spoon.   Pt continues to exhibit improved phonation in both quality and durance! See the above treatment note for details.   OBJECTIVE IMPAIRMENTS include voice disorder and dysphagia. These impairments are limiting patient from effectively communicating at home and in community and safety when swallowing. Factors affecting potential to achieve goals and functional outcome are  pt's self-confessed pessimism . Patient will benefit from skilled SLP services to address above impairments and improve overall function.  REHAB POTENTIAL: Fair severity of impairments; pt's self-confused pessimism  PLAN: SLP FREQUENCY: 1-2x/week  SLP DURATION: 8 weeks  PLANNED INTERVENTIONS: SLP instruction and feedback and Patient/family education    Simrah Chatham B.  Dreama Saa, M.S., CCC-SLP, Tree surgeon Certified Brain Injury Specialist [facility]  [facility] Office [phone] Ascom [phone] Fax [phone]

## 2023-08-28 ENCOUNTER — Telehealth: Payer: Medicare Other | Admitting: Psychiatry

## 2023-08-28 ENCOUNTER — Encounter: Payer: Self-pay | Admitting: Psychiatry

## 2023-08-28 DIAGNOSIS — G4701 Insomnia due to medical condition: Secondary | ICD-10-CM

## 2023-08-28 DIAGNOSIS — F3181 Bipolar II disorder: Secondary | ICD-10-CM

## 2023-08-28 DIAGNOSIS — G3184 Mild cognitive impairment, so stated: Secondary | ICD-10-CM

## 2023-08-28 DIAGNOSIS — Z79899 Other long term (current) drug therapy: Secondary | ICD-10-CM | POA: Diagnosis not present

## 2023-08-28 MED ORDER — ZALEPLON 10 MG PO CAPS: 10.0000 mg | ORAL_CAPSULE | Freq: Every evening | ORAL | 3 refills | Status: DC | PRN

## 2023-08-28 MED ORDER — LITHIUM CARBONATE ER 450 MG PO TBCR: 675.0000 mg | EXTENDED_RELEASE_TABLET | Freq: Every day | ORAL | 0 refills | Status: DC

## 2023-08-28 NOTE — Progress Notes (Signed)
Virtual Visit via Video Note  I connected with Adrian Lewis on 08/28/23 at  1:30 PM EDT by a video enabled telemedicine application and verified that I am speaking with the correct person using two identifiers.  Location Provider Location : [facility] Patient Location : Home  Participants: Patient , Provider    I discussed the limitations of evaluation and management by telemedicine and the availability of in person appointments. The patient expressed understanding and agreed to proceed.   I discussed the assessment and treatment plan with the patient. The patient was provided an opportunity to ask questions and all were answered. The patient agreed with the plan and demonstrated an understanding of the instructions.   The patient was advised to call back or seek an in-person evaluation if the symptoms worsen or if the condition fails to improve as anticipated.   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  Patient today appeared to be alert, oriented to person place time situation.  Reports he is currently struggling with his voice, since he has been having some trouble with it since he is status post thyroidectomy , status post vocal cord paralysis.  Patient has had speech therapy and reports some improvement.  Patient however reports he struggles with the fact that he has been a lot more depressed than previously on the lower dosage of lithium.   Patient reports he would like to go up on the lithium to possibly 675 mg.  He has in fact started the higher dosage of lithium, 675 mg a few days ago.  Patient reports he felt it was unacceptable that he was not doing well on the lower dosage of lithium and did better on the higher dosage for years.  Patient reports after he started the higher dosage himself he had levels drawn on 08/26/2023.  Lithium level reviewed and discussed with patient-0.5-subtherapeutic.  Patient with history of junctional bradycardia which was likely due to lithium and during his hospitalization was advised to reduce the lithium dosage.  Patient however reports he had cardiology visit recently and cardiology also recommended he could increase the dose.  I have reviewed notes per Dr. Roma Schanz dated 08/21/2023-[facility] cardiology, ' Zio showing no HB, sinus pause or junctional bradycardia.  Patient at baseline we will have him reduce theophylline and follow-up for EKG and symptom review.  Patient currently being tapered off of the theophylline."  Patient reports sleep is overall okay.  Continues to use CPAP.  Also compliant on the zaleplon.  Patient denies any suicidality, homicidality or perceptual disturbances.  Collateral information obtained from wife-Adrian Lewis who was also present in session.  Agrees with patient.  Denies any other concerns today.      Visit Diagnosis:    ICD-10-CM   1. Bipolar 2 disorder, major depressive episode (HCC)  F31.81 lithium carbonate (ESKALITH) 450 MG ER tablet    Lithium level    BUN+Creat   moderate    2. Insomnia due to medical condition  G47.01 zaleplon (SONATA) 10  MG capsule   OSA with CPAP, mood disorder    3. Mild cognitive impairment  G31.84     4. High risk medication use  Z79.899 Lithium level    BUN+Creat      Past Psychiatric History: I have reviewed past psychiatric history from progress note on 01/21/2022.  Past trials of medications like Lunesta, Sonata, Belsomra-cost,  Wellbutrin XL-did not tolerate a higher dosage of 300 mg.  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   Elevated hemoglobin A1c 08/07/2021   Excessive drinking of alcohol 01/01/2023   01/01/23 - reported 4 hard ciders (4.5% ABV) nightly   Generalized anxiety disorder 06/16/2017   GERD (gastroesophageal reflux disease)    History of hernia repair 05/14/2012   History of kidney stones    Hyperlipidemia, mixed 12/19/2020   Hyponatremia 10/08/2013   Hypothyroidism    Insomnia due to medical condition 06/16/2017   Macular degeneration    Major depressive disorder    Medication overdose 06/16/2017   Mild cognitive impairment of uncertain or unknown etiology 01/01/2023   Nephrolithiasis 01/25/2020   Obstructive sleep apnea 10/07/2013   Osteopenia 04/04/2014   Paresthesia of both feet 12/03/2022   Radiculopathy 02/24/2013   Rising PSA level 08/07/2021   Shortness of breath on exertion 12/19/2020   Tremor 03/19/2022    Past Surgical History:  Procedure Laterality Date   CATARACT EXTRACTION W/ INTRAOCULAR LENS IMPLANT  2011 (APPROX)   RIGHT EYE AND REPAIR DETACHED RETINA   CYSTOSCOPY WITH RETROGRADE PYELOGRAM, URETEROSCOPY AND STENT PLACEMENT Bilateral 02/23/2021   Procedure: CYSTOSCOPY WITH RETROGRADE PYELOGRAM, URETEROSCOPY AND STENT PLACEMENT;  Surgeon: Sebastian Ache, MD;  Location: [facility];  Service: Urology;  Laterality: Bilateral;  75 MINS   EYE SURGERY     HAMMER TOE SURGERY  2012  &  2010  (APPROX)   ONE TOE , EACH FOOT   HERNIA REPAIR Bilateral    Inguinal Hernia Repair   HOLMIUM LASER APPLICATION Bilateral 02/23/2021   Procedure: HOLMIUM LASER APPLICATION;  Surgeon: Sebastian Ache, MD;   Location: [facility];  Service: Urology;  Laterality: Bilateral;   JOINT REPLACEMENT Left 2014   Partial Knee Replacement   KNEE SURGERY Left    x 2  partial and reconstructive   NASAL TURBINATE REDUCTION Bilateral 09/27/2015   Procedure: TURBINATE REDUCTION/SUBMUCOSAL RESECTION;  Surgeon: Geanie Logan, MD;  Location: [facility];  Service: ENT;  Laterality: Bilateral;   REPAIR RIGHT INGUINAL HERNIA W/ MESH  02-14-2006   SEPTOPLASTY N/A 09/27/2015   Procedure: SEPTOPLASTY;  Surgeon: Geanie Logan, MD;  Location: [facility];  Service: ENT;  Laterality: N/A;   URETEROSCOPY  08/12/2012   Procedure: URETEROSCOPY;  Surgeon: Sebastian Ache, MD;  Location: [facility];  Service: Urology;  Laterality: Left;  1 HR     Family Psychiatric History: I have reviewed family psychiatric history from progress note on 01/21/2022.  Family History:  Family History  Problem Relation Age of Onset   Dementia Mother        with advanced age; ~27s   Heart attack Father    Suicidality Sister    Suicidality Brother    Parkinson's disease Maternal Aunt     Social History: I have reviewed social  history from progress note on 01/21/2022. Social History   Socioeconomic History   Marital status: Married    Spouse name: Not on file   Number of children: 2   Years of education: 20   Highest education level: Doctorate  Occupational History   Occupation: Retired    Comment: English professor  Tobacco Use   Smoking status: Never   Smokeless tobacco: Never  Vaping Use   Vaping status: Never Used  Substance and Sexual Activity   Alcohol use: Yes    Alcohol/week: 28.0 standard drinks of alcohol    Types: 28 Standard drinks or equivalent per week    Comment: 4 hard ciders nightly   Drug use: No   Sexual activity: Not Currently  Other Topics Concern   Not on file  Social History Narrative   Right handed   Drinks caffeine   Two story home   Lives with wife in the home   retired   Chief Executive Officer  Determinants of Health   Financial Resource Strain: Low Risk  (06/27/2023)   [facility]   Overall Financial Resource Strain (CARDIA)    Difficulty of Paying Living Expenses: Not hard at all  Food Insecurity: No Food Insecurity (06/27/2023)   Received [facility]   Hunger Vital Sign    Worried About Running Out of Food in the Last Year: Never true    Ran Out of Food in the Last Year: Never true  Transportation Needs: No Transportation Needs (06/27/2023)   Received [facility]   PRAPARE - Transportation    Lack of Transportation (Medical): No    Lack of Transportation (Non-Medical): No  Physical Activity: Not on file  Stress: Not on file  Social Connections: Not on file    Allergies:  Allergies  Allergen Reactions   Bee Venom Anaphylaxis    Has EPI pen for this   Cat Hair Extract Other (See Comments)    Per pt his nose runs/congestion.    Clonidine Other (See Comments)    fatigue fatigue fatigue   Grass Pollen(K-O-R-T-Swt Vern) Other (See Comments)    Runny nose   Other Other (See Comments)    Per pt his nose runs/congestion.    Metabolic Disorder Labs: No results found for: "HGBA1C", "MPG" No results found for: "PROLACTIN" No results found for: "CHOL", "TRIG", "HDL", "CHOLHDL", "VLDL", "LDLCALC" Lab Results  Component Value Date   TSH 1.310 11/06/2022   TSH 1.030 03/04/2022    Therapeutic Level Labs: Lab Results  Component Value Date   LITHIUM 0.5 08/26/2023   LITHIUM 0.4 (L) 07/10/2023   No results found for: "VALPROATE" No results found for: "CBMZ"  Current Medications: Current Outpatient Medications  Medication Sig Dispense Refill   acetaminophen (TYLENOL) 500 MG tablet Take by mouth.     lithium carbonate (ESKALITH) 450 MG ER tablet Take 1.5 tablets (675 mg total) by mouth daily. 135 tablet 0   Ascorbic Acid (VITAMIN C) 1000 MG tablet Take 1,000 mg by mouth daily.     b complex vitamins capsule Take 1 capsule by mouth  daily.     buPROPion (WELLBUTRIN SR) 200 MG 12 hr tablet Take 1 tablet (200 mg total) by mouth in the morning. 90 tablet 1   Coenzyme Q10 (COQ10 PO) Take 60 mg by mouth daily.     cyanocobalamin (VITAMIN B12) 1000 MCG tablet Take by mouth.     ergocalciferol (VITAMIN D2) 1.25 MG (50000 UT) capsule Take 50,000  Units by mouth 2 (two) times a week.     lamoTRIgine (LAMICTAL) 100 MG tablet Take 1 tablet (100 mg total) by mouth daily. 90 tablet 1   levothyroxine (SYNTHROID, LEVOTHROID) 25 MCG tablet Take 25 mcg by mouth Daily.      lithium carbonate (ESKALITH) 450 MG ER tablet Take 1 tablet (450 mg total) by mouth 2 (two) times daily. Dose change (Patient taking differently: Take 450 mg by mouth daily. Dose change) 180 tablet 0   magnesium oxide (MAG-OX) 400 MG tablet Take by mouth.     Menaquinone-7 (VITAMIN K2) 100 MCG CAPS Take 1 capsule by mouth daily. (Patient not taking: Reported on 08/05/2023)     Misc Natural Products (PROSTATE HEALTH PO) Take 1 capsule by mouth daily.     Multiple Vitamins-Minerals (OCUVITE PRESERVISION PO) Take 1 tablet by mouth daily.     Omega-3 Fatty Acids (FISH OIL) 1000 MG CAPS Take 1,000 mg by mouth daily.     OVER THE COUNTER MEDICATION Take 1 capsule by mouth daily. Adrenogen     potassium citrate (UROCIT-K) 10 MEQ (1080 MG) SR tablet 1 tablet with meals     Probiotic Product (PROBIOTIC DAILY PO) Take 1 capsule by mouth daily.     Quercetin 500 MG CAPS Take 1 capsule by mouth daily.     RESVERATROL PO Take 500 mg by mouth daily.     S-Adenosylmethionine (SAME) 400 MG TABS Take 400 mg by mouth daily.     senna-docusate (SENOKOT-S) 8.6-50 MG tablet Take 1 tablet by mouth 2 (two) times daily. While taking strongest pain meds to prevent constipation. 10 tablet 0   sodium fluoride (FLUORISHIELD) 1.1 % GEL dental gel Place onto teeth.     telmisartan (MICARDIS) 20 MG tablet Take 40 mg by mouth daily.     theophylline (THEO-24) 300 MG 24 hr capsule Take 300 mg by mouth  daily. Takes 200 mg for a week , 100 mg for another week and stop     Tryptophan 500 MG CAPS Take 1 capsule by mouth at bedtime.     zaleplon (SONATA) 10 MG capsule Take 1 capsule (10 mg total) by mouth at bedtime as needed for sleep. 30 capsule 3   No current facility-administered medications for this visit.     Musculoskeletal: Strength & Muscle Tone:  UTA Gait & Station:  Seated Patient leans: N/A  Psychiatric Specialty Exam: Review of Systems  Psychiatric/Behavioral:  Positive for dysphoric mood.     There were no vitals taken for this visit.There is no height or weight on file to calculate BMI.  General Appearance: Fairly Groomed  Eye Contact:  Poor  Speech:  Slow  Volume:  Decreased  Mood:  Depressed  Affect:  Congruent  Thought Process:  Goal Directed and Descriptions of Associations: Intact  Orientation:  Full (Time, Place, and Person)  Thought Content: Logical   Suicidal Thoughts:  No  Homicidal Thoughts:  No  Memory:  Immediate;   Fair Recent;   Fair Remote;   Fair  Judgement:  Fair  Insight:  Fair  Psychomotor Activity:  Normal  Concentration:  Concentration: Fair and Attention Span: Fair  Recall:  [employer] of Knowledge: Fair  Language: Fair  Akathisia:  No  Handed:  Right  AIMS (if indicated): not done  Assets:  Desire for Improvement Housing Intimacy Social Support Transportation  ADL's:  Intact  Cognition: WNL  Sleep:  Fair   Screenings: GAD-7    Flowsheet  Row Office Visit from 04/01/2023 in [facility]  Total GAD-7 Score 0      PHQ2-9    Flowsheet Row Office Visit from 07/17/2023 in [facility] Visit from 04/01/2023 in [facility] Video Visit from 11/26/2022 in [facility] Video Visit from 10/10/2022 in [facility]  Video Visit from 07/17/2022 in [facility]  PHQ-2 Total Score 0 1 0 0 3  PHQ-9 Total Score -- 5 -- -- 9      Flowsheet Row Video Visit from 08/28/2023 in [facility] Video Visit from 07/03/2023 in [facility] Office Visit from 04/01/2023 in [facility]  C-SSRS RISK CATEGORY No Risk No Risk No Risk        Assessment and Plan: Adrian Lewis is a 77 year old Caucasian male who has a history of bipolar disorder type II, insomnia, cognitive disorder likely mild, status post thyroidectomy, currently struggling with multiple medical problems including hoarseness of voice due to vocal cord paralysis, status post thyroidectomy, currently in speech therapy, as well as worsening depression symptoms likely due to being on a lower dosage of lithium, will benefit from medication readjustment, plan as noted below.  Plan Bipolar disorder type II depressed moderate-unstable Patient recently increased the lithium back to 675 mg p.o. daily.  Patient to continue the same dosage. Lamotrigine 100 mg p.o. daily Continue Wellbutrin SR 200 mg daily in the morning. I have reviewed notes per cardiology, patient to continue to follow up with cardiology while titrating lithium.  Patient with recent cardiac changes including junctional bradycardia currently improving.  Patient is currently being tapered off of theophylline.  Insomnia-improving Sonata 10 mg p.o. nightly Continue CPAP for OSA Reviewed [place] PMP AWARxE  Mild cognitive disorder-chronic MOCA-03/27/2022-25 out of 30 Patient had recent consultation with [facility]-reviewed notes per Ms.Wertman.   High risk medication use-lithium level-08/26/2023 reviewed and discussed with patient-0.5.  Geriatric patient, this level likely therapeutic.  Will be cautious about readjusting the dosage to a higher  dose due to history of cardiac problems, will coordinate care with cardiology. We will repeat lithium level in 3 weeks from now.  Order lithium level, BUN/creatinine.  Patient to go to [facility]  Patient advised to monitor for lithium toxicity symptoms.   Follow-up in clinic in 6 to 8 weeks or sooner if needed.  Collaboration of Care: Collaboration of Care: Other I have reviewed notes per cardiology-Dr. Cora Daniels as noted above.  08/21/2023 I have also obtained collateral information from wife.  Patient/Guardian was advised Release of Information must be obtained prior to any record release in order to collaborate their care with an outside provider. Patient/Guardian was advised if they have not already done so to contact the registration department to sign all necessary forms in order for [place] to release information regarding their care.   Consent: Patient/Guardian gives verbal consent for treatment and assignment of benefits for services provided during this visit. Patient/Guardian expressed understanding and agreed to proceed.   I have spent atleast 40 minutes face to face with patient today which includes the time spent for preparing to see the patient ( e.g., review of test, records ), obtaining and to review and separately obtained history , ordering medications and test ,psychoeducation and supportive psychotherapy and care coordination,as well as documenting clinical  information in electronic health record,interpreting and communication of test results.    Adrian Longs, MD 08/28/2023, 2:34 PM

## 2023-09-01 ENCOUNTER — Ambulatory Visit: Payer: Medicare Other | Admitting: Speech Pathology

## 2023-09-03 ENCOUNTER — Ambulatory Visit: Payer: Medicare Other | Admitting: Speech Pathology

## 2023-09-03 DIAGNOSIS — R490 Dysphonia: Secondary | ICD-10-CM | POA: Diagnosis not present

## 2023-09-03 DIAGNOSIS — J3801 Paralysis of vocal cords and larynx, unilateral: Secondary | ICD-10-CM

## 2023-09-03 NOTE — Therapy (Signed)
OUTPATIENT SPEECH LANGUAGE PATHOLOGY  TREATMENT NOTE   Patient Name: Adrian Lewis MRN: [id number] DOB:1946/09/08, 77 y.o., male Today's Date: 09/03/2023  PCP: Dorothey Baseman, MD REFERRING PROVIDER: Geanie Logan, MD    End of Session - 09/03/23 1547     Visit Number 13    Number of Visits 17    Date for SLP Re-Evaluation 09/10/23    Authorization Type Medicare A/Medicare B    Progress Note Due on Visit 20    SLP Start Time 1530    SLP Stop Time  1615    SLP Time Calculation (min) 45 min    Activity Tolerance Patient tolerated treatment well             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   Elevated hemoglobin A1c 08/07/2021   Excessive drinking of alcohol 01/01/2023   01/01/23 - reported 4 hard ciders (4.5% ABV) nightly   Generalized anxiety disorder 06/16/2017   GERD (gastroesophageal reflux disease)    History of hernia repair 05/14/2012   History of kidney stones    Hyperlipidemia, mixed 12/19/2020   Hyponatremia 10/08/2013   Hypothyroidism    Insomnia due to medical condition 06/16/2017   Macular degeneration    Major depressive disorder    Medication overdose 06/16/2017   Mild cognitive impairment of uncertain or unknown etiology 01/01/2023   Nephrolithiasis 01/25/2020   Obstructive sleep apnea 10/07/2013   Osteopenia 04/04/2014   Paresthesia of both feet 12/03/2022   Radiculopathy 02/24/2013   Rising PSA level 08/07/2021   Shortness of breath on exertion 12/19/2020   Tremor 03/19/2022   Past Surgical History:  Procedure Laterality Date   CATARACT EXTRACTION W/ INTRAOCULAR LENS IMPLANT  2011 (APPROX)   RIGHT EYE AND REPAIR DETACHED RETINA   CYSTOSCOPY WITH RETROGRADE PYELOGRAM,  URETEROSCOPY AND STENT PLACEMENT Bilateral 02/23/2021   Procedure: CYSTOSCOPY WITH RETROGRADE PYELOGRAM, URETEROSCOPY AND STENT PLACEMENT;  Surgeon: Sebastian Ache, MD;  Location: [facility];  Service: Urology;  Laterality: Bilateral;  75 MINS   EYE SURGERY     HAMMER TOE SURGERY  2012  &  2010  (APPROX)   ONE TOE , EACH FOOT   HERNIA REPAIR Bilateral    Inguinal Hernia Repair   HOLMIUM LASER APPLICATION Bilateral 02/23/2021   Procedure: HOLMIUM LASER APPLICATION;  Surgeon: Sebastian Ache, MD;  Location: [facility];  Service: Urology;  Laterality: Bilateral;   JOINT REPLACEMENT Left 2014   Partial Knee Replacement   KNEE SURGERY Left    x 2  partial and reconstructive   NASAL TURBINATE REDUCTION Bilateral 09/27/2015   Procedure: TURBINATE REDUCTION/SUBMUCOSAL RESECTION;  Surgeon: Geanie Logan, MD;  Location: [facility];  Service: ENT;  Laterality: Bilateral;   REPAIR RIGHT INGUINAL HERNIA W/ MESH  02-14-2006   SEPTOPLASTY N/A 09/27/2015   Procedure: SEPTOPLASTY;  Surgeon: Geanie Logan, MD;  Location: [facility];  Service: ENT;  Laterality: N/A;   URETEROSCOPY  08/12/2012   Procedure: URETEROSCOPY;  Surgeon: Sebastian Ache, MD;  Location: [facility];  Service: Urology;  Laterality: Left;  1 HR    Patient Active Problem List   Diagnosis Date Noted   Neuropathic pain of both feet 04/15/2023   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   Chronic pain  syndrome 04/15/2023   Mild cognitive impairment 01/01/2023   Excessive drinking of alcohol 01/01/2023   Major depressive disorder 12/31/2022   GERD (gastroesophageal reflux disease) 12/31/2022   Macular degeneration 12/31/2022   Paresthesia of both feet 12/03/2022   Tremor 03/19/2022   Circadian rhythm sleep disorder, delayed sleep phase type 01/21/2022   Bipolar disorder II, in full remission, most recent episode depressed 01/21/2022   High risk medication use 01/21/2022   Elevated hemoglobin A1c 08/07/2021   Rising PSA  level 08/07/2021   Hyperlipidemia, mixed 12/19/2020   Benign essential hypertension 12/19/2020   Abnormal ECG 12/19/2020   Shortness of breath on exertion 12/19/2020   Nephrolithiasis 01/25/2020   Medication overdose 06/16/2017   Insomnia due to medical condition 06/16/2017   Generalized anxiety disorder 06/16/2017   Osteopenia 04/04/2014   Hyponatremia 10/08/2013   Obstructive sleep apnea 10/07/2013   Hypothyroidism 10/07/2013   Bilateral sciatica 10/07/2013   Back pain 02/24/2013   Radiculopathy 02/24/2013   History of hernia repair 05/14/2012    ONSET DATE:  03/27/2023; date of referral 07/10/2023  REFERRING DIAG: 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 Laryngoscopy revealed hypomobility of right vocal cord and complete immobility of left vocal cord.   Modified Barium Swallow Study 05/12/2023 No aspiration observed, continue regular diet with thin liquids  06/26/2023 Left hemithyroidectomy  Bedside Swallow Evaluation Recommend continue regular diet with thin liquids, head turn to pt's left to reduce s/s of aspiration at bedside  07/10/2023 Referral received from Dr Geanie Logan Laryngoscopy revealed "left cord as immobile, right cord does move although there may be some limited abduction."    PAIN:  Are you having pain? No   FALLS: Has patient fallen in last 6 months? No,  LIVING ENVIRONMENT: Lives with: lives with their  spouse Lives in: House/apartment  PLOF: Independent  PATIENT GOALS    to swallow safely and improve vocal quality  SUBJECTIVE STATEMENT: Pt missed on e session d/t his wife being ill Pt accompanied by: significant other  OBJECTIVE:   TODAY'S TREATMENT:  Skilled treatment session focused on pt's dysphonia. SLP facilitated session by providing the following interventions:  Pt states that he had to re-schedule his appt with Dr Martina Sinner d/t his wife being sick - new appt scheduled for January, in the interm, he has an appt with local ENT (Dr Willeen Cass) this month, will provide date of appt during next visit  Expiratory Muscle Strength Trainer (EMST) - rare supervision cues for slow rate and quick bursts of air to achieve 7 out of 10 with device increased to 90 cmH2O, Pt with continued great ability to sequence task  Sustained AH during pushing activity was used to facilitate articulatory movement for numbers to improve quality of phonation, pt has tendency to produce great quality ah's and then applies tension/stain when moving articulators for other sounds  PATIENT EDUCATION: Education details: see above Person educated: Patient and Spouse Education method: Explanation Education comprehension: needs further education   HOME EXERCISE PROGRAM: Perform  the above 3 times per days 8 times each  GOALS: Goals reviewed with patient? Yes  SHORT TERM GOALS: Target date: 10 sessions Updated: 08/20/2023 Patient will participate in objective swallowing evaluation (MBSS) to identify safest diet recommendation as well as therapeutic targets.  Baseline: Goal status: INITIAL: MET  2.  With Mod I, pt will demonstrate understanding of result and recommendations of MBS by verbalizing salient points.   Baseline:  Goal status: INITIAL: Goal met as pt is reliant on his wife for understanding d/t Mild Cognitive Impairment  3.  The patient will maximize voice quality and loudness using breath  support/oral resonance for sustained vowel production, pitch glides, and hierarchal speech drill.   Baseline:  Goal status: INITIAL; MET- upgraded to The patient will maximize voice quality and loudness using breath support/oral resonance for sentence level utterances with Min A.    LONG TERM GOALS: Target date: 09/10/2023 Updated:08/20/2023 Patient will improve perception of swallowing as indicated by an improvement in EAT-10 score by 12 weeks from initial swallowing therapy session.  Baseline:  Goal status: INITIAL: Ongoing  2.  The patient will be independent for abdominal breathing and breath support exercises.   Baseline:  Goal status: INITIAL:Ongoing  3.  The patient will demonstrate independent understanding of vocal hygiene concepts.   Baseline:  Goal status: INITIAL: Ongoing  4.  Patient will increase MEP by 10% indicating improved breath support, cough function, and respiratory strength for adequate speech function.   ASSESSMENT:  CLINICAL IMPRESSION: Patient is a 77 y.o. male who was seen today for behavioral voice therapy.  Pt presents with improving moderate dysphonia that is c/b hoarse, breathy, low vocal intensity.  In addition he presents with s/s concerning for pharyngeal phase dysphagia when consuming thin liquids. Modified Barium Swallow Study performed on 07/18/2023 with recommendation for thin liquids via spoon.   Pt continues to exhibit improved phonation in both quality and durance! See the above treatment note for details.   OBJECTIVE IMPAIRMENTS include voice disorder and dysphagia. These impairments are limiting patient from effectively communicating at home and in community and safety when swallowing. Factors affecting potential to achieve goals and functional outcome are  pt's self-confessed pessimism . Patient will benefit from skilled SLP services to address above impairments and improve overall function.  REHAB POTENTIAL: Fair severity of impairments;  pt's self-confused pessimism  PLAN: SLP FREQUENCY: 1-2x/week  SLP DURATION: 8 weeks  PLANNED INTERVENTIONS: SLP instruction and feedback and Patient/family education    Han Lysne B. Dreama Saa, M.S., CCC-SLP, Tree surgeon Certified Brain Injury Specialist [facility]  [facility] Rehabilitation Services Office [phone] Ascom [phone] Fax [phone]

## 2023-09-08 ENCOUNTER — Ambulatory Visit: Payer: Medicare Other | Admitting: Speech Pathology

## 2023-09-10 ENCOUNTER — Ambulatory Visit: Payer: Medicare Other | Admitting: Podiatry

## 2023-09-10 ENCOUNTER — Ambulatory Visit: Payer: Medicare Other | Admitting: Speech Pathology

## 2023-09-10 DIAGNOSIS — J3801 Paralysis of vocal cords and larynx, unilateral: Secondary | ICD-10-CM

## 2023-09-10 DIAGNOSIS — R490 Dysphonia: Secondary | ICD-10-CM | POA: Diagnosis not present

## 2023-09-10 NOTE — Therapy (Unsigned)
OUTPATIENT SPEECH LANGUAGE PATHOLOGY  TREATMENT NOTE   Patient Name: Adrian Lewis MRN: [id number] DOB:Nov 16, 1946, 77 y.o., male Today's Date: 09/10/2023  PCP: Dorothey Baseman, MD REFERRING PROVIDER: Geanie Logan, MD    End of Session - 09/10/23 1530     Visit Number 14    Number of Visits 17    Date for SLP Re-Evaluation 09/10/23    Authorization Type Medicare A/Medicare B    Progress Note Due on Visit 20    SLP Start Time 1530    SLP Stop Time  1615    SLP Time Calculation (min) 45 min    Activity Tolerance Patient tolerated treatment well             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   Elevated hemoglobin A1c 08/07/2021   Excessive drinking of alcohol 01/01/2023   01/01/23 - reported 4 hard ciders (4.5% ABV) nightly   Generalized anxiety disorder 06/16/2017   GERD (gastroesophageal reflux disease)    History of hernia repair 05/14/2012   History of kidney stones    Hyperlipidemia, mixed 12/19/2020   Hyponatremia 10/08/2013   Hypothyroidism    Insomnia due to medical condition 06/16/2017   Macular degeneration    Major depressive disorder    Medication overdose 06/16/2017   Mild cognitive impairment of uncertain or unknown etiology 01/01/2023   Nephrolithiasis 01/25/2020   Obstructive sleep apnea 10/07/2013   Osteopenia 04/04/2014   Paresthesia of both feet 12/03/2022   Radiculopathy 02/24/2013   Rising PSA level 08/07/2021   Shortness of breath on exertion 12/19/2020   Tremor 03/19/2022   Past Surgical History:  Procedure Laterality Date   CATARACT EXTRACTION W/ INTRAOCULAR LENS IMPLANT  2011 (APPROX)   RIGHT EYE AND REPAIR DETACHED RETINA   CYSTOSCOPY WITH RETROGRADE PYELOGRAM,  URETEROSCOPY AND STENT PLACEMENT Bilateral 02/23/2021   Procedure: CYSTOSCOPY WITH RETROGRADE PYELOGRAM, URETEROSCOPY AND STENT PLACEMENT;  Surgeon: Sebastian Ache, MD;  Location: [facility];  Service: Urology;  Laterality: Bilateral;  75 MINS   EYE SURGERY     HAMMER TOE SURGERY  2012  &  2010  (APPROX)   ONE TOE , EACH FOOT   HERNIA REPAIR Bilateral    Inguinal Hernia Repair   HOLMIUM LASER APPLICATION Bilateral 02/23/2021   Procedure: HOLMIUM LASER APPLICATION;  Surgeon: Sebastian Ache, MD;  Location: [facility];  Service: Urology;  Laterality: Bilateral;   JOINT REPLACEMENT Left 2014   Partial Knee Replacement   KNEE SURGERY Left    x 2  partial and reconstructive   NASAL TURBINATE REDUCTION Bilateral 09/27/2015   Procedure: TURBINATE REDUCTION/SUBMUCOSAL RESECTION;  Surgeon: Geanie Logan, MD;  Location: [facility];  Service: ENT;  Laterality: Bilateral;   REPAIR RIGHT INGUINAL HERNIA W/ MESH  02-14-2006   SEPTOPLASTY N/A 09/27/2015   Procedure: SEPTOPLASTY;  Surgeon: Geanie Logan, MD;  Location: [facility];  Service: ENT;  Laterality: N/A;   URETEROSCOPY  08/12/2012   Procedure: URETEROSCOPY;  Surgeon: Sebastian Ache, MD;  Location: [facility];  Service: Urology;  Laterality: Left;  1 HR    Patient Active Problem List   Diagnosis Date Noted   Neuropathic pain of both feet 04/15/2023   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   Chronic pain  syndrome 04/15/2023   Mild cognitive impairment 01/01/2023   Excessive drinking of alcohol 01/01/2023   Major depressive disorder 12/31/2022   GERD (gastroesophageal reflux disease) 12/31/2022   Macular degeneration 12/31/2022   Paresthesia of both feet 12/03/2022   Tremor 03/19/2022   Circadian rhythm sleep disorder, delayed sleep phase type 01/21/2022   Bipolar disorder II, in full remission, most recent episode depressed 01/21/2022   High risk medication use 01/21/2022   Elevated hemoglobin A1c 08/07/2021   Rising PSA  level 08/07/2021   Hyperlipidemia, mixed 12/19/2020   Benign essential hypertension 12/19/2020   Abnormal ECG 12/19/2020   Shortness of breath on exertion 12/19/2020   Nephrolithiasis 01/25/2020   Medication overdose 06/16/2017   Insomnia due to medical condition 06/16/2017   Generalized anxiety disorder 06/16/2017   Osteopenia 04/04/2014   Hyponatremia 10/08/2013   Obstructive sleep apnea 10/07/2013   Hypothyroidism 10/07/2013   Bilateral sciatica 10/07/2013   Back pain 02/24/2013   Radiculopathy 02/24/2013   History of hernia repair 05/14/2012    ONSET DATE:  03/27/2023; date of referral 07/10/2023  REFERRING DIAG: 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 Laryngoscopy revealed hypomobility of right vocal cord and complete immobility of left vocal cord.   Modified Barium Swallow Study 05/12/2023 No aspiration observed, continue regular diet with thin liquids  06/26/2023 Left hemithyroidectomy  Bedside Swallow Evaluation Recommend continue regular diet with thin liquids, head turn to pt's left to reduce s/s of aspiration at bedside  07/10/2023 Referral received from Dr Geanie Logan Laryngoscopy revealed "left cord as immobile, right cord does move although there may be some limited abduction."    PAIN:  Are you having pain? No   FALLS: Has patient fallen in last 6 months? No,  LIVING ENVIRONMENT: Lives with: lives with their  spouse Lives in: House/apartment  PLOF: Independent  PATIENT GOALS    to swallow safely and improve vocal quality  SUBJECTIVE STATEMENT: Pt reports they had COVID, pt was really sick over the weekend Pt accompanied by: significant other  OBJECTIVE:   TODAY'S TREATMENT:  Skilled treatment session focused on pt's dysphonia. SLP facilitated session by providing the following interventions:  Pt states that he had to re-schedule his appt with Dr Martina Sinner d/t his wife being sick - new appt scheduled for January, in the interm, he has an appt with local ENT (Dr Willeen Cass) this month, will provide date of appt during next visit  Expiratory Muscle Strength Trainer (EMST) - rare supervision cues for slow rate and quick bursts of air to achieve 7 out of 10 with device increased to 90 cmH2O, Pt with continued great ability to sequence task  Sustained AH during pushing activity was used to facilitate articulatory movement for numbers to improve quality of phonation, pt has tendency to produce great quality ah's and then applies tension/stain when moving articulators for other sounds  PATIENT EDUCATION: Education details: see above Person educated: Patient and Spouse Education method: Explanation Education comprehension: needs further education   HOME EXERCISE  PROGRAM: Perform the above 3 times per days 8 times each  GOALS: Goals reviewed with patient? Yes  SHORT TERM GOALS: Target date: 10 sessions Updated: 08/20/2023 Patient will participate in objective swallowing evaluation (MBSS) to identify safest diet recommendation as well as therapeutic targets.  Baseline: Goal status: INITIAL: MET  2.  With Mod I, pt will demonstrate understanding of result and recommendations of MBS by verbalizing salient points.   Baseline:  Goal status: INITIAL: Goal met as pt is reliant on his wife for understanding d/t Mild Cognitive Impairment  3.  The patient will maximize voice quality and loudness  using breath support/oral resonance for sustained vowel production, pitch glides, and hierarchal speech drill.   Baseline:  Goal status: INITIAL; MET- upgraded to The patient will maximize voice quality and loudness using breath support/oral resonance for sentence level utterances with Min A.    LONG TERM GOALS: Target date: 09/10/2023 Updated:08/20/2023 Patient will improve perception of swallowing as indicated by an improvement in EAT-10 score by 12 weeks from initial swallowing therapy session.  Baseline:  Goal status: INITIAL: Ongoing  2.  The patient will be independent for abdominal breathing and breath support exercises.   Baseline:  Goal status: INITIAL:Ongoing  3.  The patient will demonstrate independent understanding of vocal hygiene concepts.   Baseline:  Goal status: INITIAL: Ongoing  4.  Patient will increase MEP by 10% indicating improved breath support, cough function, and respiratory strength for adequate speech function.   ASSESSMENT:  CLINICAL IMPRESSION: Patient is a 77 y.o. male who was seen today for behavioral voice therapy.  Pt presents with improving moderate dysphonia that is c/b hoarse, breathy, low vocal intensity.  In addition he presents with s/s concerning for pharyngeal phase dysphagia when consuming thin liquids. Modified Barium Swallow Study performed on 07/18/2023 with recommendation for thin liquids via spoon.   Pt continues to exhibit improved phonation in both quality and durance! See the above treatment note for details.   OBJECTIVE IMPAIRMENTS include voice disorder and dysphagia. These impairments are limiting patient from effectively communicating at home and in community and safety when swallowing. Factors affecting potential to achieve goals and functional outcome are  pt's self-confessed pessimism . Patient will benefit from skilled SLP services to address above impairments and improve overall function.  REHAB POTENTIAL: Fair severity of  impairments; pt's self-confused pessimism  PLAN: SLP FREQUENCY: 1-2x/week  SLP DURATION: 8 weeks  PLANNED INTERVENTIONS: SLP instruction and feedback and Patient/family education    Belicia Difatta B. Dreama Saa, M.S., CCC-SLP, Tree surgeon Certified Brain Injury Specialist [facility]  [facility] Rehabilitation Services Office [phone] Ascom [phone] Fax [phone]

## 2023-09-12 ENCOUNTER — Ambulatory Visit: Payer: Medicare Other | Admitting: Speech Pathology

## 2023-09-15 ENCOUNTER — Ambulatory Visit: Payer: Medicare Other | Admitting: Speech Pathology

## 2023-09-15 DIAGNOSIS — R490 Dysphonia: Secondary | ICD-10-CM

## 2023-09-15 DIAGNOSIS — J3801 Paralysis of vocal cords and larynx, unilateral: Secondary | ICD-10-CM

## 2023-09-15 NOTE — Therapy (Signed)
OUTPATIENT SPEECH LANGUAGE PATHOLOGY  TREATMENT NOTE RE-CERTIFICATION REQUEST   Patient Name: Adrian Lewis MRN: [id number] DOB:[birth year], 77 y.o., male Today's Date: 09/15/2023  PCP: Dorothey Baseman, MD REFERRING PROVIDER: Geanie Logan, MD    End of Session - 09/15/23 0943     Visit Number 15    Number of Visits 29    Date for SLP Re-Evaluation 11/03/23    Authorization Type Medicare A/Medicare B    Progress Note Due on Visit 20    SLP Start Time 0940    SLP Stop Time  1015    SLP Time Calculation (min) 35 min    Activity Tolerance Patient tolerated treatment well             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   Elevated hemoglobin A1c 08/07/2021   Excessive drinking of alcohol 01/01/2023   01/01/23 - reported 4 hard ciders (4.5% ABV) nightly   Generalized anxiety disorder 06/16/2017   GERD (gastroesophageal reflux disease)    History of hernia repair 05/14/2012   History of kidney stones    Hyperlipidemia, mixed 12/19/2020   Hyponatremia 10/08/2013   Hypothyroidism    Insomnia due to medical condition 06/16/2017   Macular degeneration    Major depressive disorder    Medication overdose 06/16/2017   Mild cognitive impairment of uncertain or unknown etiology 01/01/2023   Nephrolithiasis 01/25/2020   Obstructive sleep apnea 10/07/2013   Osteopenia 04/04/2014   Paresthesia of both feet 12/03/2022   Radiculopathy 02/24/2013   Rising PSA level 08/07/2021   Shortness of breath on exertion 12/19/2020   Tremor 03/19/2022   Past Surgical History:  Procedure Laterality Date   CATARACT EXTRACTION W/ INTRAOCULAR LENS IMPLANT  2011 (APPROX)   RIGHT EYE AND REPAIR DETACHED RETINA   CYSTOSCOPY  WITH RETROGRADE PYELOGRAM, URETEROSCOPY AND STENT PLACEMENT Bilateral 02/23/2021   Procedure: CYSTOSCOPY WITH RETROGRADE PYELOGRAM, URETEROSCOPY AND STENT PLACEMENT;  Surgeon: Sebastian Ache, MD;  Location: [facility];  Service: Urology;  Laterality: Bilateral;  75 MINS   EYE SURGERY     HAMMER TOE SURGERY  2012  &  2010  (APPROX)   ONE TOE , EACH FOOT   HERNIA REPAIR Bilateral    Inguinal Hernia Repair   HOLMIUM LASER APPLICATION Bilateral 02/23/2021   Procedure: HOLMIUM LASER APPLICATION;  Surgeon: Sebastian Ache, MD;  Location: [facility];  Service: Urology;  Laterality: Bilateral;   JOINT REPLACEMENT Left 2014   Partial Knee Replacement   KNEE SURGERY Left    x 2  partial and reconstructive   NASAL TURBINATE REDUCTION Bilateral 09/27/2015   Procedure: TURBINATE REDUCTION/SUBMUCOSAL RESECTION;  Surgeon: Geanie Logan, MD;  Location: [facility];  Service: ENT;  Laterality: Bilateral;   REPAIR RIGHT INGUINAL HERNIA W/ MESH  02-14-2006   SEPTOPLASTY N/A 09/27/2015   Procedure: SEPTOPLASTY;  Surgeon: Geanie Logan, MD;  Location: [facility];  Service: ENT;  Laterality: N/A;   URETEROSCOPY  08/12/2012   Procedure: URETEROSCOPY;  Surgeon: Sebastian Ache, MD;  Location: [facility];  Service: Urology;  Laterality: Left;  1 HR    Patient Active Problem List   Diagnosis Date Noted   Neuropathic pain of both feet 04/15/2023   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  Chronic pain syndrome 04/15/2023   Mild cognitive impairment 01/01/2023   Excessive drinking of alcohol 01/01/2023   Major depressive disorder 12/31/2022   GERD (gastroesophageal reflux disease) 12/31/2022   Macular degeneration 12/31/2022   Paresthesia of both feet 12/03/2022   Tremor 03/19/2022   Circadian rhythm sleep disorder, delayed sleep phase type 01/21/2022   Bipolar disorder II, in full remission, most recent episode depressed 01/21/2022   High risk medication use 01/21/2022   Elevated hemoglobin A1c  08/07/2021   Rising PSA level 08/07/2021   Hyperlipidemia, mixed 12/19/2020   Benign essential hypertension 12/19/2020   Abnormal ECG 12/19/2020   Shortness of breath on exertion 12/19/2020   Nephrolithiasis 01/25/2020   Medication overdose 06/16/2017   Insomnia due to medical condition 06/16/2017   Generalized anxiety disorder 06/16/2017   Osteopenia 04/04/2014   Hyponatremia 10/08/2013   Obstructive sleep apnea 10/07/2013   Hypothyroidism 10/07/2013   Bilateral sciatica 10/07/2013   Back pain 02/24/2013   Radiculopathy 02/24/2013   History of hernia repair 05/14/2012    ONSET DATE:  03/27/2023; date of referral 07/10/2023  REFERRING DIAG: 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 Laryngoscopy revealed hypomobility of right vocal cord and complete immobility of left vocal cord.   Modified Barium Swallow Study 05/12/2023 No aspiration observed, continue regular diet with thin liquids  06/26/2023 Left hemithyroidectomy  Bedside Swallow Evaluation Recommend continue regular diet with thin liquids, head turn to pt's left to reduce s/s of aspiration at bedside  07/10/2023 Referral received from Dr Geanie Logan Laryngoscopy revealed "left cord as immobile, right cord does move although there may be some limited abduction."    PAIN:  Are you having pain? No   FALLS: Has patient fallen in last 6 months? No,  LIVING ENVIRONMENT: Lives with:  lives with their spouse Lives in: House/apartment  PLOF: Independent  PATIENT GOALS    to swallow safely and improve vocal quality  SUBJECTIVE STATEMENT: "My voice is still hoarse from COVID" Pt accompanied by: significant other  OBJECTIVE:   TODAY'S TREATMENT:  Skilled treatment session focused on pt's dysphonia. SLP facilitated session by providing the following interventions:  Expiratory Muscle Strength Trainer (EMST) - Pt performed 3 sets of 10 repetitions with EMST 150 set at 90 cmH2O with self-reported effort level of 7 out of 10  When using adductor exercises (pushing down) while producing AH - pt with improved vocal quality - however he reports that it "doesn't sound this good at home" we were able to identify that he isn't "projecting" his voice at home and that he is practicing once a day instead of prescribed 3 times (at least 2 times daily). Pt reports, "I work outside all day gardening" - Education provided on prognosis for improvement given reduced practice.   Despite current recommendation for consuming thin liquids via spoon, pt continues to consume water during session via top of water bottle. No overt s/s of aspiration observed. However pt confirmed silent aspiration on recent Modified Barium Swallow Study.  PATIENT EDUCATION: Education details: see above Person educated: Patient and Spouse Education method: Explanation Education comprehension: needs further education   HOME EXERCISE PROGRAM: Perform the above 3 times per days 8 times each  GOALS: Goals reviewed with patient? Yes  SHORT TERM GOALS: Target date: 10 sessions Updated: 08/20/2023 Patient will participate in objective swallowing evaluation (MBSS) to identify safest diet recommendation as well as therapeutic targets.  Baseline: Goal status: INITIAL: MET  2.  With Mod I, pt will demonstrate understanding of result and recommendations of MBS by verbalizing salient points.   Baseline:  Goal  status: INITIAL: Goal met as pt is reliant on his wife for understanding d/t Mild Cognitive Impairment  3.  The patient will maximize voice quality and loudness using breath support/oral resonance for sustained vowel production, pitch glides, and hierarchal speech drill.   Baseline:  Goal status: INITIAL; MET- upgraded to The patient will maximize voice quality and loudness using breath support/oral resonance for sentence level utterances with Min A. - progress made   LONG TERM GOALS: Target date: 09/10/2023 Updated:08/20/2023 Patient will improve perception of swallowing as indicated by an improvement in EAT-10 score by 12 weeks from initial swallowing therapy session.  Baseline:  Goal status: INITIAL: Ongoing; progress made  2.  The patient will be independent for abdominal breathing and breath support exercises.   Baseline:  Goal status: INITIAL:Ongoing: progress made  3.  The patient will demonstrate independent understanding of vocal hygiene concepts.   Baseline:  Goal status: INITIAL: Ongoing: progress made  4.  Patient will increase MEP by 10% indicating improved breath support, cough function, and respiratory strength for adequate speech function.: progress made   ASSESSMENT:  CLINICAL IMPRESSION: Patient is a 77 y.o. male who was seen today for behavioral voice therapy.  Pt presents with improving moderate dysphonia that is c/b hoarse, breathy, low vocal intensity.  In addition he presents with s/s concerning for pharyngeal phase dysphagia when consuming thin liquids. Modified Barium Swallow Study performed on 07/18/2023 with recommendation for thin liquids via spoon.   His wife reports that his voice is improved but not as strong as it was before COVID. Will request re-certification.    See the above treatment note for details.   OBJECTIVE IMPAIRMENTS include voice disorder and dysphagia. These impairments are limiting patient from effectively communicating at home and  in community and safety when swallowing. Factors affecting potential to achieve goals and functional outcome are  pt's self-confessed pessimism . Patient will benefit from skilled SLP services to address above impairments and improve overall function.  REHAB POTENTIAL: Fair severity of impairments; pt's self-confused pessimism  PLAN: SLP FREQUENCY: 1-2x/week  SLP DURATION: 8 weeks  PLANNED INTERVENTIONS: SLP instruction and feedback and Patient/family education    Sruthi Maurer B. Dreama Saa, M.S., CCC-SLP, Tree surgeon Certified Brain Injury Specialist [facility]  [facility] Rehabilitation Services Office [phone] Ascom [phone] Fax [phone]

## 2023-09-17 ENCOUNTER — Ambulatory Visit: Payer: Medicare Other | Admitting: Speech Pathology

## 2023-09-17 DIAGNOSIS — R490 Dysphonia: Secondary | ICD-10-CM

## 2023-09-17 DIAGNOSIS — J3801 Paralysis of vocal cords and larynx, unilateral: Secondary | ICD-10-CM

## 2023-09-17 NOTE — Therapy (Addendum)
OUTPATIENT SPEECH LANGUAGE PATHOLOGY  TREATMENT NOTE    Patient Name: Adrian Lewis MRN: [id number] DOB:[birth year], 77 y.o., male Today's Date: 09/17/2023  PCP: Dorothey Baseman, MD REFERRING PROVIDER: Geanie Logan, MD    End of Session - 09/17/23 1450     Visit Number 16    Number of Visits 29    Date for SLP Re-Evaluation 11/03/23    Authorization Type Medicare A/Medicare B    Progress Note Due on Visit 20    SLP Start Time 1445    SLP Stop Time  1530    SLP Time Calculation (min) 45 min    Activity Tolerance Patient tolerated treatment well             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   Elevated hemoglobin A1c 08/07/2021   Excessive drinking of alcohol 01/01/2023   01/01/23 - reported 4 hard ciders (4.5% ABV) nightly   Generalized anxiety disorder 06/16/2017   GERD (gastroesophageal reflux disease)    History of hernia repair 05/14/2012   History of kidney stones    Hyperlipidemia, mixed 12/19/2020   Hyponatremia 10/08/2013   Hypothyroidism    Insomnia due to medical condition 06/16/2017   Macular degeneration    Major depressive disorder    Medication overdose 06/16/2017   Mild cognitive impairment of uncertain or unknown etiology 01/01/2023   Nephrolithiasis 01/25/2020   Obstructive sleep apnea 10/07/2013   Osteopenia 04/04/2014   Paresthesia of both feet 12/03/2022   Radiculopathy 02/24/2013   Rising PSA level 08/07/2021   Shortness of breath on exertion 12/19/2020   Tremor 03/19/2022   Past Surgical History:  Procedure Laterality Date   CATARACT EXTRACTION W/ INTRAOCULAR LENS IMPLANT  2011 (APPROX)   RIGHT EYE AND REPAIR DETACHED RETINA   CYSTOSCOPY WITH RETROGRADE PYELOGRAM,  URETEROSCOPY AND STENT PLACEMENT Bilateral 02/23/2021   Procedure: CYSTOSCOPY WITH RETROGRADE PYELOGRAM, URETEROSCOPY AND STENT PLACEMENT;  Surgeon: Sebastian Ache, MD;  Location: [facility];  Service: Urology;  Laterality: Bilateral;  75 MINS   EYE SURGERY     HAMMER TOE SURGERY  2012  &  2010  (APPROX)   ONE TOE , EACH FOOT   HERNIA REPAIR Bilateral    Inguinal Hernia Repair   HOLMIUM LASER APPLICATION Bilateral 02/23/2021   Procedure: HOLMIUM LASER APPLICATION;  Surgeon: Sebastian Ache, MD;  Location: [facility];  Service: Urology;  Laterality: Bilateral;   JOINT REPLACEMENT Left 2014   Partial Knee Replacement   KNEE SURGERY Left    x 2  partial and reconstructive   NASAL TURBINATE REDUCTION Bilateral 09/27/2015   Procedure: TURBINATE REDUCTION/SUBMUCOSAL RESECTION;  Surgeon: Geanie Logan, MD;  Location: [facility];  Service: ENT;  Laterality: Bilateral;   REPAIR RIGHT INGUINAL HERNIA W/ MESH  02-14-2006   SEPTOPLASTY N/A 09/27/2015   Procedure: SEPTOPLASTY;  Surgeon: Geanie Logan, MD;  Location: [facility];  Service: ENT;  Laterality: N/A;   URETEROSCOPY  08/12/2012   Procedure: URETEROSCOPY;  Surgeon: Sebastian Ache, MD;  Location: [facility];  Service: Urology;  Laterality: Left;  1 HR    Patient Active Problem List   Diagnosis Date Noted   Neuropathic pain of both feet 04/15/2023   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   Chronic  pain syndrome 04/15/2023   Mild cognitive impairment 01/01/2023   Excessive drinking of alcohol 01/01/2023   Major depressive disorder 12/31/2022   GERD (gastroesophageal reflux disease) 12/31/2022   Macular degeneration 12/31/2022   Paresthesia of both feet 12/03/2022   Tremor 03/19/2022   Circadian rhythm sleep disorder, delayed sleep phase type 01/21/2022   Bipolar disorder II, in full remission, most recent episode depressed 01/21/2022   High risk medication use 01/21/2022   Elevated hemoglobin A1c 08/07/2021   Rising PSA  level 08/07/2021   Hyperlipidemia, mixed 12/19/2020   Benign essential hypertension 12/19/2020   Abnormal ECG 12/19/2020   Shortness of breath on exertion 12/19/2020   Nephrolithiasis 01/25/2020   Medication overdose 06/16/2017   Insomnia due to medical condition 06/16/2017   Generalized anxiety disorder 06/16/2017   Osteopenia 04/04/2014   Hyponatremia 10/08/2013   Obstructive sleep apnea 10/07/2013   Hypothyroidism 10/07/2013   Bilateral sciatica 10/07/2013   Back pain 02/24/2013   Radiculopathy 02/24/2013   History of hernia repair 05/14/2012    ONSET DATE:  03/27/2023; date of referral 07/10/2023  REFERRING DIAG: 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 Laryngoscopy revealed hypomobility of right vocal cord and complete immobility of left vocal cord.   Modified Barium Swallow Study 05/12/2023 No aspiration observed, continue regular diet with thin liquids  06/26/2023 Left hemithyroidectomy  Bedside Swallow Evaluation Recommend continue regular diet with thin liquids, head turn to pt's left to reduce s/s of aspiration at bedside  07/10/2023 Referral received from Dr Geanie Logan Laryngoscopy revealed "left cord as immobile, right cord does move although there may be some limited abduction."    PAIN:  Are you having pain? No   FALLS: Has patient fallen in last 6 months? No,  LIVING ENVIRONMENT: Lives with: lives with their  spouse Lives in: House/apartment  PLOF: Independent  PATIENT GOALS    to swallow safely and improve vocal quality  SUBJECTIVE STATEMENT: "I just stay so busy working outside" referring to difficulty practicing his voice exercises Pt accompanied by: significant other  OBJECTIVE:   TODAY'S TREATMENT:  Skilled treatment session focused on pt's dysphonia. SLP facilitated session by providing the following interventions:  Expiratory Muscle Strength Trainer (EMST) - Pt performed 3 sets of 10 repetitions with EMST 150 set at 90 cmH2O with self-reported effort level of 7 out of 10  With cues to "project" his voice, pt is able to achieve improved respiratory support, phonation and vocal quality - improved during 20 minute conversation  Despite current recommendation for consuming thin liquids via spoon, pt continues to consume water during session via top of water bottle. No overt s/s of aspiration observed. However pt confirmed silent aspiration on recent Modified Barium Swallow Study.   PATIENT EDUCATION: Education details: see above Person educated: Patient and Spouse Education method: Explanation Education comprehension: needs further education   HOME EXERCISE PROGRAM: Perform the above 3 times per days 8 times each  GOALS: Goals reviewed with patient? Yes  SHORT TERM GOALS: Target date:  10 sessions Updated: 08/20/2023 Patient will participate in objective swallowing evaluation (MBSS) to identify safest diet recommendation as well as therapeutic targets.  Baseline: Goal status: INITIAL: MET  2.  With Mod I, pt will demonstrate understanding of result and recommendations of MBS by verbalizing salient points.   Baseline:  Goal status: INITIAL: Goal met as pt is reliant on his wife for understanding d/t Mild Cognitive Impairment  3.  The patient will maximize voice quality and loudness using breath support/oral resonance for sustained vowel production, pitch glides, and  hierarchal speech drill.   Baseline:  Goal status: INITIAL; MET- upgraded to The patient will maximize voice quality and loudness using breath support/oral resonance for sentence level utterances with Min A. - progress made   LONG TERM GOALS: Target date: 09/10/2023 Updated:08/20/2023 Patient will improve perception of swallowing as indicated by an improvement in EAT-10 score by 12 weeks from initial swallowing therapy session.  Baseline:  Goal status: INITIAL: Ongoing; progress made  2.  The patient will be independent for abdominal breathing and breath support exercises.   Baseline:  Goal status: INITIAL:Ongoing: progress made  3.  The patient will demonstrate independent understanding of vocal hygiene concepts.   Baseline:  Goal status: INITIAL: Ongoing: progress made  4.  Patient will increase MEP by 10% indicating improved breath support, cough function, and respiratory strength for adequate speech function.: progress made   ASSESSMENT:  CLINICAL IMPRESSION: Patient is a 77 y.o. male who was seen today for behavioral voice therapy.  Pt presents with improving moderate dysphonia that is c/b hoarse, breathy, low vocal intensity.  In addition he presents with s/s concerning for pharyngeal phase dysphagia when consuming thin liquids. Modified Barium Swallow Study performed on 07/18/2023 with recommendation for thin liquids via spoon.   See the above treatment note for details.   OBJECTIVE IMPAIRMENTS include voice disorder and dysphagia. These impairments are limiting patient from effectively communicating at home and in community and safety when swallowing. Factors affecting potential to achieve goals and functional outcome are  pt's self-confessed pessimism . Patient will benefit from skilled SLP services to address above impairments and improve overall function.  REHAB POTENTIAL: Fair severity of impairments; pt's self-confused pessimism  PLAN: SLP FREQUENCY:  1-2x/week  SLP DURATION: 8 weeks  PLANNED INTERVENTIONS: SLP instruction and feedback and Patient/family education    Nirvan Laban B. Dreama Saa, M.S., CCC-SLP, Tree surgeon Certified Brain Injury Specialist [facility]  [facility] Rehabilitation Services Office [phone] Ascom [phone] Fax [phone]

## 2023-09-22 ENCOUNTER — Ambulatory Visit: Payer: Medicare Other | Admitting: Speech Pathology

## 2023-09-22 DIAGNOSIS — R490 Dysphonia: Secondary | ICD-10-CM

## 2023-09-22 DIAGNOSIS — J3801 Paralysis of vocal cords and larynx, unilateral: Secondary | ICD-10-CM

## 2023-09-22 NOTE — Therapy (Unsigned)
OUTPATIENT SPEECH LANGUAGE PATHOLOGY  TREATMENT NOTE    Patient Name: Adrian Lewis MRN: [id number] DOB:Feb 09, 1946, 77 y.o., male Today's Date: 09/22/2023  PCP: Dorothey Baseman, MD REFERRING PROVIDER: Geanie Logan, MD    End of Session - 09/22/23 1526     Visit Number 17    Number of Visits 29    Date for SLP Re-Evaluation 11/03/23    Authorization Type Medicare A/Medicare B    Progress Note Due on Visit 20    SLP Start Time 1530    SLP Stop Time  1615    SLP Time Calculation (min) 45 min    Activity Tolerance Patient tolerated treatment well             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   Elevated hemoglobin A1c 08/07/2021   Excessive drinking of alcohol 01/01/2023   01/01/23 - reported 4 hard ciders (4.5% ABV) nightly   Generalized anxiety disorder 06/16/2017   GERD (gastroesophageal reflux disease)    History of hernia repair 05/14/2012   History of kidney stones    Hyperlipidemia, mixed 12/19/2020   Hyponatremia 10/08/2013   Hypothyroidism    Insomnia due to medical condition 06/16/2017   Macular degeneration    Major depressive disorder    Medication overdose 06/16/2017   Mild cognitive impairment of uncertain or unknown etiology 01/01/2023   Nephrolithiasis 01/25/2020   Obstructive sleep apnea 10/07/2013   Osteopenia 04/04/2014   Paresthesia of both feet 12/03/2022   Radiculopathy 02/24/2013   Rising PSA level 08/07/2021   Shortness of breath on exertion 12/19/2020   Tremor 03/19/2022   Past Surgical History:  Procedure Laterality Date   CATARACT EXTRACTION W/ INTRAOCULAR LENS IMPLANT  2011 (APPROX)   RIGHT EYE AND REPAIR DETACHED RETINA   CYSTOSCOPY WITH RETROGRADE PYELOGRAM,  URETEROSCOPY AND STENT PLACEMENT Bilateral 02/23/2021   Procedure: CYSTOSCOPY WITH RETROGRADE PYELOGRAM, URETEROSCOPY AND STENT PLACEMENT;  Surgeon: Sebastian Ache, MD;  Location: [facility];  Service: Urology;  Laterality: Bilateral;  75 MINS   EYE SURGERY     HAMMER TOE SURGERY  2012  &  2010  (APPROX)   ONE TOE , EACH FOOT   HERNIA REPAIR Bilateral    Inguinal Hernia Repair   HOLMIUM LASER APPLICATION Bilateral 02/23/2021   Procedure: HOLMIUM LASER APPLICATION;  Surgeon: Sebastian Ache, MD;  Location: [facility];  Service: Urology;  Laterality: Bilateral;   JOINT REPLACEMENT Left 2014   Partial Knee Replacement   KNEE SURGERY Left    x 2  partial and reconstructive   NASAL TURBINATE REDUCTION Bilateral 09/27/2015   Procedure: TURBINATE REDUCTION/SUBMUCOSAL RESECTION;  Surgeon: Geanie Logan, MD;  Location: [facility];  Service: ENT;  Laterality: Bilateral;   REPAIR RIGHT INGUINAL HERNIA W/ MESH  02-14-2006   SEPTOPLASTY N/A 09/27/2015   Procedure: SEPTOPLASTY;  Surgeon: Geanie Logan, MD;  Location: [facility];  Service: ENT;  Laterality: N/A;   URETEROSCOPY  08/12/2012   Procedure: URETEROSCOPY;  Surgeon: Sebastian Ache, MD;  Location: [facility];  Service: Urology;  Laterality: Left;  1 HR    Patient Active Problem List   Diagnosis Date Noted   Neuropathic pain of both feet 04/15/2023   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   Chronic  pain syndrome 04/15/2023   Mild cognitive impairment 01/01/2023   Excessive drinking of alcohol 01/01/2023   Major depressive disorder 12/31/2022   GERD (gastroesophageal reflux disease) 12/31/2022   Macular degeneration 12/31/2022   Paresthesia of both feet 12/03/2022   Tremor 03/19/2022   Circadian rhythm sleep disorder, delayed sleep phase type 01/21/2022   Bipolar disorder II, in full remission, most recent episode depressed 01/21/2022   High risk medication use 01/21/2022   Elevated hemoglobin A1c 08/07/2021   Rising PSA  level 08/07/2021   Hyperlipidemia, mixed 12/19/2020   Benign essential hypertension 12/19/2020   Abnormal ECG 12/19/2020   Shortness of breath on exertion 12/19/2020   Nephrolithiasis 01/25/2020   Medication overdose 06/16/2017   Insomnia due to medical condition 06/16/2017   Generalized anxiety disorder 06/16/2017   Osteopenia 04/04/2014   Hyponatremia 10/08/2013   Obstructive sleep apnea 10/07/2013   Hypothyroidism 10/07/2013   Bilateral sciatica 10/07/2013   Back pain 02/24/2013   Radiculopathy 02/24/2013   History of hernia repair 05/14/2012    ONSET DATE:  03/27/2023; date of referral 07/10/2023  REFERRING DIAG: 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 Laryngoscopy revealed hypomobility of right vocal cord and complete immobility of left vocal cord.   Modified Barium Swallow Study 05/12/2023 No aspiration observed, continue regular diet with thin liquids  06/26/2023 Left hemithyroidectomy  Bedside Swallow Evaluation Recommend continue regular diet with thin liquids, head turn to pt's left to reduce s/s of aspiration at bedside  07/10/2023 Referral received from Dr Geanie Logan Laryngoscopy revealed "left cord as immobile, right cord does move although there may be some limited abduction."    PAIN:  Are you having pain? No   FALLS: Has patient fallen in last 6 months? No,  LIVING ENVIRONMENT: Lives with: lives with their  spouse Lives in: House/apartment  PLOF: Independent  PATIENT GOALS    to swallow safely and improve vocal quality  SUBJECTIVE STATEMENT: "I forget, I know I should be practicing more but I work outside so I am really only practicing in the evening - gardening is almost a compulsion for me" Pt accompanied by: significant other  OBJECTIVE:   TODAY'S TREATMENT:  Skilled treatment session focused on pt's dysphonia. SLP facilitated session by providing the following interventions:  Expiratory Muscle Strength Trainer (EMST) - Pt performed 3 sets of 10 repetitions with EMST 150 set at 90 cmH2O with self-reported effort level of 7 out of 10 - "I don't want it any harder at all"   With cues to "project" his voice, pt is able to achieve improved respiratory support, phonation and vocal quality - improved during 20 minute conversation  Despite current recommendation for consuming thin liquids via spoon, pt continues to consume water during session via top of water bottle. No overt s/s of aspiration observed. However pt confirmed silent aspiration on recent Modified Barium Swallow Study.   PATIENT EDUCATION: Education details: see above Person educated: Patient and Spouse Education method: Explanation Education comprehension: needs further education  HOME EXERCISE PROGRAM: Perform the above 3 times per days 8 times each  GOALS: Goals reviewed with patient? Yes  SHORT TERM GOALS: Target date: 10 sessions Updated: 08/20/2023 Patient will participate in objective swallowing evaluation (MBSS) to identify safest diet recommendation as well as therapeutic targets.  Baseline: Goal status: INITIAL: MET  2.  With Mod I, pt will demonstrate understanding of result and recommendations of MBS by verbalizing salient points.   Baseline:  Goal status: INITIAL: Goal met as pt is reliant on his wife for understanding d/t Mild Cognitive Impairment  3.  The patient will maximize voice quality and  loudness using breath support/oral resonance for sustained vowel production, pitch glides, and hierarchal speech drill.   Baseline:  Goal status: INITIAL; MET- upgraded to The patient will maximize voice quality and loudness using breath support/oral resonance for sentence level utterances with Min A. - progress made   LONG TERM GOALS: Target date: 09/10/2023 Updated:08/20/2023 Patient will improve perception of swallowing as indicated by an improvement in EAT-10 score by 12 weeks from initial swallowing therapy session.  Baseline:  Goal status: INITIAL: Ongoing; progress made  2.  The patient will be independent for abdominal breathing and breath support exercises.   Baseline:  Goal status: INITIAL:Ongoing: progress made  3.  The patient will demonstrate independent understanding of vocal hygiene concepts.   Baseline:  Goal status: INITIAL: Ongoing: progress made  4.  Patient will increase MEP by 10% indicating improved breath support, cough function, and respiratory strength for adequate speech function.: progress made   ASSESSMENT:  CLINICAL IMPRESSION: Patient is a 77 y.o. male who was seen today for behavioral voice therapy.  Pt presents with improving moderate dysphonia that is c/b hoarse, breathy, low vocal intensity.  In addition he presents with s/s concerning for pharyngeal phase dysphagia when consuming thin liquids. Modified Barium Swallow Study performed on 07/18/2023 with recommendation for thin liquids via spoon.   See the above treatment note for details.   OBJECTIVE IMPAIRMENTS include voice disorder and dysphagia. These impairments are limiting patient from effectively communicating at home and in community and safety when swallowing. Factors affecting potential to achieve goals and functional outcome are  pt's self-confessed pessimism . Patient will benefit from skilled SLP services to address above impairments and improve overall function.  REHAB POTENTIAL: Fair  severity of impairments; pt's self-confused pessimism  PLAN: SLP FREQUENCY: 1-2x/week  SLP DURATION: 8 weeks  PLANNED INTERVENTIONS: SLP instruction and feedback and Patient/family education    Alexiya Franqui B. Dreama Saa, M.S., CCC-SLP, Tree surgeon Certified Brain Injury Specialist [facility]  [facility] Office [phone] Ascom [phone] Fax [phone]

## 2023-09-24 ENCOUNTER — Ambulatory Visit: Payer: Medicare Other | Admitting: Speech Pathology

## 2023-09-24 DIAGNOSIS — R490 Dysphonia: Secondary | ICD-10-CM

## 2023-09-24 DIAGNOSIS — J3801 Paralysis of vocal cords and larynx, unilateral: Secondary | ICD-10-CM

## 2023-09-24 NOTE — Therapy (Unsigned)
OUTPATIENT SPEECH LANGUAGE PATHOLOGY  TREATMENT NOTE    Patient Name: Adrian Lewis MRN: [id number] DOB:11-Aug-1946, 77 y.o., male Today's Date: 09/24/2023  PCP: Dorothey Baseman, MD REFERRING PROVIDER: Geanie Logan, MD    End of Session - 09/24/23 1613     Visit Number 18    Number of Visits 29    Date for SLP Re-Evaluation 11/03/23    Authorization Type Medicare A/Medicare B    Progress Note Due on Visit 20    SLP Start Time 1545    SLP Stop Time  1630    SLP Time Calculation (min) 45 min    Activity Tolerance Patient tolerated treatment well             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   Elevated hemoglobin A1c 08/07/2021   Excessive drinking of alcohol 01/01/2023   01/01/23 - reported 4 hard ciders (4.5% ABV) nightly   Generalized anxiety disorder 06/16/2017   GERD (gastroesophageal reflux disease)    History of hernia repair 05/14/2012   History of kidney stones    Hyperlipidemia, mixed 12/19/2020   Hyponatremia 10/08/2013   Hypothyroidism    Insomnia due to medical condition 06/16/2017   Macular degeneration    Major depressive disorder    Medication overdose 06/16/2017   Mild cognitive impairment of uncertain or unknown etiology 01/01/2023   Nephrolithiasis 01/25/2020   Obstructive sleep apnea 10/07/2013   Osteopenia 04/04/2014   Paresthesia of both feet 12/03/2022   Radiculopathy 02/24/2013   Rising PSA level 08/07/2021   Shortness of breath on exertion 12/19/2020   Tremor 03/19/2022   Past Surgical History:  Procedure Laterality Date   CATARACT EXTRACTION W/ INTRAOCULAR LENS IMPLANT  2011 (APPROX)   RIGHT EYE AND REPAIR DETACHED RETINA   CYSTOSCOPY WITH RETROGRADE PYELOGRAM,  URETEROSCOPY AND STENT PLACEMENT Bilateral 02/23/2021   Procedure: CYSTOSCOPY WITH RETROGRADE PYELOGRAM, URETEROSCOPY AND STENT PLACEMENT;  Surgeon: Sebastian Ache, MD;  Location: [facility];  Service: Urology;  Laterality: Bilateral;  75 MINS   EYE SURGERY     HAMMER TOE SURGERY  2012  &  2010  (APPROX)   ONE TOE , EACH FOOT   HERNIA REPAIR Bilateral    Inguinal Hernia Repair   HOLMIUM LASER APPLICATION Bilateral 02/23/2021   Procedure: HOLMIUM LASER APPLICATION;  Surgeon: Sebastian Ache, MD;  Location: [facility];  Service: Urology;  Laterality: Bilateral;   JOINT REPLACEMENT Left 2014   Partial Knee Replacement   KNEE SURGERY Left    x 2  partial and reconstructive   NASAL TURBINATE REDUCTION Bilateral 09/27/2015   Procedure: TURBINATE REDUCTION/SUBMUCOSAL RESECTION;  Surgeon: Geanie Logan, MD;  Location: [facility];  Service: ENT;  Laterality: Bilateral;   REPAIR RIGHT INGUINAL HERNIA W/ MESH  02-14-2006   SEPTOPLASTY N/A 09/27/2015   Procedure: SEPTOPLASTY;  Surgeon: Geanie Logan, MD;  Location: [facility];  Service: ENT;  Laterality: N/A;   URETEROSCOPY  08/12/2012   Procedure: URETEROSCOPY;  Surgeon: Sebastian Ache, MD;  Location: [facility];  Service: Urology;  Laterality: Left;  1 HR    Patient Active Problem List   Diagnosis Date Noted   Neuropathic pain of both feet 04/15/2023   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   Chronic  pain syndrome 04/15/2023   Mild cognitive impairment 01/01/2023   Excessive drinking of alcohol 01/01/2023   Major depressive disorder 12/31/2022   GERD (gastroesophageal reflux disease) 12/31/2022   Macular degeneration 12/31/2022   Paresthesia of both feet 12/03/2022   Tremor 03/19/2022   Circadian rhythm sleep disorder, delayed sleep phase type 01/21/2022   Bipolar disorder II, in full remission, most recent episode depressed 01/21/2022   High risk medication use 01/21/2022   Elevated hemoglobin A1c 08/07/2021   Rising PSA  level 08/07/2021   Hyperlipidemia, mixed 12/19/2020   Benign essential hypertension 12/19/2020   Abnormal ECG 12/19/2020   Shortness of breath on exertion 12/19/2020   Nephrolithiasis 01/25/2020   Medication overdose 06/16/2017   Insomnia due to medical condition 06/16/2017   Generalized anxiety disorder 06/16/2017   Osteopenia 04/04/2014   Hyponatremia 10/08/2013   Obstructive sleep apnea 10/07/2013   Hypothyroidism 10/07/2013   Bilateral sciatica 10/07/2013   Back pain 02/24/2013   Radiculopathy 02/24/2013   History of hernia repair 05/14/2012    ONSET DATE:  03/27/2023; date of referral 07/10/2023  REFERRING DIAG: 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 Laryngoscopy revealed hypomobility of right vocal cord and complete immobility of left vocal cord.   Modified Barium Swallow Study 05/12/2023 No aspiration observed, continue regular diet with thin liquids  06/26/2023 Left hemithyroidectomy  Bedside Swallow Evaluation Recommend continue regular diet with thin liquids, head turn to pt's left to reduce s/s of aspiration at bedside  07/10/2023 Referral received from Dr Geanie Logan Laryngoscopy revealed "left cord as immobile, right cord does move although there may be some limited abduction."    PAIN:  Are you having pain? No   FALLS: Has patient fallen in last 6 months? No,  LIVING ENVIRONMENT: Lives with: lives with their  spouse Lives in: House/apartment  PLOF: Independent  PATIENT GOALS    to swallow safely and improve vocal quality  SUBJECTIVE STATEMENT: "I forget, I know I should be practicing more but I work outside so I am really only practicing in the evening - gardening is almost a compulsion for me" Pt accompanied by: significant other  OBJECTIVE:   TODAY'S TREATMENT:  Skilled treatment session focused on pt's dysphonia. SLP facilitated session by providing the following interventions:  Expiratory Muscle Strength Trainer (EMST) - Pt performed 3 sets of 10 repetitions with EMST 150 set at 90 cmH2O with self-reported effort level of 7 out of 10 - "I don't want it any harder at all"   Continued education provided on need to practice assigned adduction exercises and projection of his voice during structured and unstructured tasks - pt continues to prioritize other activities (gardening, model trains, reading) suspect that his Mild Cognitive Impairment with functional deficits in memory are also likely to contribute - SLP worked with pt and his wife to cue hi during conversation by saying "project" your voice instead of "I can't hear you or I can't understand you"  PATIENT EDUCATION: Education details: see above Person educated: Patient  and Spouse Education method: Explanation Education comprehension: needs further education   HOME EXERCISE PROGRAM: Perform the above 3 times per days 8 times each  GOALS: Goals reviewed with patient? Yes  SHORT TERM GOALS: Target date: 10 sessions Updated: 08/20/2023 Patient will participate in objective swallowing evaluation (MBSS) to identify safest diet recommendation as well as therapeutic targets.  Baseline: Goal status: INITIAL: MET  2.  With Mod I, pt will demonstrate understanding of result and recommendations of MBS by verbalizing salient points.   Baseline:  Goal status: INITIAL: Goal met as pt is reliant on his wife for understanding d/t Mild  Cognitive Impairment  3.  The patient will maximize voice quality and loudness using breath support/oral resonance for sustained vowel production, pitch glides, and hierarchal speech drill.   Baseline:  Goal status: INITIAL; MET- upgraded to The patient will maximize voice quality and loudness using breath support/oral resonance for sentence level utterances with Min A. - progress made   LONG TERM GOALS: Target date: 09/10/2023 Updated:08/20/2023 Patient will improve perception of swallowing as indicated by an improvement in EAT-10 score by 12 weeks from initial swallowing therapy session.  Baseline:  Goal status: INITIAL: Ongoing; progress made  2.  The patient will be independent for abdominal breathing and breath support exercises.   Baseline:  Goal status: INITIAL:Ongoing: progress made  3.  The patient will demonstrate independent understanding of vocal hygiene concepts.   Baseline:  Goal status: INITIAL: Ongoing: progress made  4.  Patient will increase MEP by 10% indicating improved breath support, cough function, and respiratory strength for adequate speech function.: progress made   ASSESSMENT:  CLINICAL IMPRESSION: Patient is a 77 y.o. male who was seen today for behavioral voice therapy.  Pt presents with improving moderate dysphonia that is c/b hoarse, breathy, low vocal intensity.  In addition he presents with s/s concerning for pharyngeal phase dysphagia when consuming thin liquids. Modified Barium Swallow Study performed on 07/18/2023 with recommendation for thin liquids via spoon.   See the above treatment note for details on strict education regarding completion of HEP.    OBJECTIVE IMPAIRMENTS include voice disorder and dysphagia. These impairments are limiting patient from effectively communicating at home and in community and safety when swallowing. Factors affecting potential to achieve goals and functional outcome are  pt's self-confessed pessimism . Patient  will benefit from skilled SLP services to address above impairments and improve overall function.  REHAB POTENTIAL: Fair severity of impairments; pt's self-confused pessimism  PLAN: SLP FREQUENCY: 1-2x/week  SLP DURATION: 8 weeks  PLANNED INTERVENTIONS: SLP instruction and feedback and Patient/family education    Josuha Fontanez B. Dreama Saa, M.S., CCC-SLP, Tree surgeon Certified Brain Injury Specialist [facility]  [facility] Rehabilitation Services Office [phone] Ascom [phone] Fax [phone]

## 2023-09-30 ENCOUNTER — Ambulatory Visit: Payer: Medicare Other | Attending: Otolaryngology | Admitting: Speech Pathology

## 2023-09-30 DIAGNOSIS — J3801 Paralysis of vocal cords and larynx, unilateral: Secondary | ICD-10-CM | POA: Diagnosis present

## 2023-09-30 DIAGNOSIS — R490 Dysphonia: Secondary | ICD-10-CM | POA: Diagnosis present

## 2023-09-30 NOTE — Therapy (Signed)
OUTPATIENT SPEECH LANGUAGE PATHOLOGY  TREATMENT NOTE    Patient Name: Adrian Lewis MRN: [id number] DOB:12/21/1945, 77 y.o., male Today's Date: 09/30/2023  PCP: Dorothey Baseman, MD REFERRING PROVIDER: Geanie Logan, MD    End of Session - 09/30/23 1325     Visit Number 19    Number of Visits 29    Date for SLP Re-Evaluation 11/03/23    Authorization Type Medicare A/Medicare B    Progress Note Due on Visit 20    SLP Start Time 1315    SLP Stop Time  1400    SLP Time Calculation (min) 45 min    Activity Tolerance Patient tolerated treatment well             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   Elevated hemoglobin A1c 08/07/2021   Excessive drinking of alcohol 01/01/2023   01/01/23 - reported 4 hard ciders (4.5% ABV) nightly   Generalized anxiety disorder 06/16/2017   GERD (gastroesophageal reflux disease)    History of hernia repair 05/14/2012   History of kidney stones    Hyperlipidemia, mixed 12/19/2020   Hyponatremia 10/08/2013   Hypothyroidism    Insomnia due to medical condition 06/16/2017   Macular degeneration    Major depressive disorder    Medication overdose 06/16/2017   Mild cognitive impairment of uncertain or unknown etiology 01/01/2023   Nephrolithiasis 01/25/2020   Obstructive sleep apnea 10/07/2013   Osteopenia 04/04/2014   Paresthesia of both feet 12/03/2022   Radiculopathy 02/24/2013   Rising PSA level 08/07/2021   Shortness of breath on exertion 12/19/2020   Tremor 03/19/2022   Past Surgical History:  Procedure Laterality Date   CATARACT EXTRACTION W/ INTRAOCULAR LENS IMPLANT  2011 (APPROX)   RIGHT EYE AND REPAIR DETACHED RETINA   CYSTOSCOPY WITH RETROGRADE PYELOGRAM,  URETEROSCOPY AND STENT PLACEMENT Bilateral 02/23/2021   Procedure: CYSTOSCOPY WITH RETROGRADE PYELOGRAM, URETEROSCOPY AND STENT PLACEMENT;  Surgeon: Sebastian Ache, MD;  Location: [facility];  Service: Urology;  Laterality: Bilateral;  75 MINS   EYE SURGERY     HAMMER TOE SURGERY  2012  &  2010  (APPROX)   ONE TOE , EACH FOOT   HERNIA REPAIR Bilateral    Inguinal Hernia Repair   HOLMIUM LASER APPLICATION Bilateral 02/23/2021   Procedure: HOLMIUM LASER APPLICATION;  Surgeon: Sebastian Ache, MD;  Location: [facility];  Service: Urology;  Laterality: Bilateral;   JOINT REPLACEMENT Left 2014   Partial Knee Replacement   KNEE SURGERY Left    x 2  partial and reconstructive   NASAL TURBINATE REDUCTION Bilateral 09/27/2015   Procedure: TURBINATE REDUCTION/SUBMUCOSAL RESECTION;  Surgeon: Geanie Logan, MD;  Location: [facility];  Service: ENT;  Laterality: Bilateral;   REPAIR RIGHT INGUINAL HERNIA W/ MESH  02-14-2006   SEPTOPLASTY N/A 09/27/2015   Procedure: SEPTOPLASTY;  Surgeon: Geanie Logan, MD;  Location: [facility];  Service: ENT;  Laterality: N/A;   URETEROSCOPY  08/12/2012   Procedure: URETEROSCOPY;  Surgeon: Sebastian Ache, MD;  Location: [facility];  Service: Urology;  Laterality: Left;  1 HR    Patient Active Problem List   Diagnosis Date Noted   Neuropathic pain of both feet 04/15/2023   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   Chronic  pain syndrome 04/15/2023   Mild cognitive impairment 01/01/2023   Excessive drinking of alcohol 01/01/2023   Major depressive disorder 12/31/2022   GERD (gastroesophageal reflux disease) 12/31/2022   Macular degeneration 12/31/2022   Paresthesia of both feet 12/03/2022   Tremor 03/19/2022   Circadian rhythm sleep disorder, delayed sleep phase type 01/21/2022   Bipolar disorder II, in full remission, most recent episode depressed 01/21/2022   High risk medication use 01/21/2022   Elevated hemoglobin A1c 08/07/2021   Rising PSA  level 08/07/2021   Hyperlipidemia, mixed 12/19/2020   Benign essential hypertension 12/19/2020   Abnormal ECG 12/19/2020   Shortness of breath on exertion 12/19/2020   Nephrolithiasis 01/25/2020   Medication overdose 06/16/2017   Insomnia due to medical condition 06/16/2017   Generalized anxiety disorder 06/16/2017   Osteopenia 04/04/2014   Hyponatremia 10/08/2013   Obstructive sleep apnea 10/07/2013   Hypothyroidism 10/07/2013   Bilateral sciatica 10/07/2013   Back pain 02/24/2013   Radiculopathy 02/24/2013   History of hernia repair 05/14/2012    ONSET DATE:  03/27/2023; date of referral 07/10/2023  REFERRING DIAG: 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 Laryngoscopy revealed hypomobility of right vocal cord and complete immobility of left vocal cord.   Modified Barium Swallow Study 05/12/2023 No aspiration observed, continue regular diet with thin liquids  06/26/2023 Left hemithyroidectomy  Bedside Swallow Evaluation Recommend continue regular diet with thin liquids, head turn to pt's left to reduce s/s of aspiration at bedside  07/10/2023 Referral received from Dr Geanie Logan Laryngoscopy revealed "left cord as immobile, right cord does move although there may be some limited abduction."    PAIN:  Are you having pain? No   FALLS: Has patient fallen in last 6 months? No,  LIVING ENVIRONMENT: Lives with: lives with their  spouse Lives in: House/apartment  PLOF: Independent  PATIENT GOALS    to swallow safely and improve vocal quality  SUBJECTIVE STATEMENT: "At home for the last week to 10 days, he is much closer to being normal" Pt accompanied by: significant other  OBJECTIVE:   TODAY'S TREATMENT:  Skilled treatment session focused on pt's dysphonia. SLP facilitated session by providing the following interventions:  Expiratory Muscle Strength Trainer (EMST) - Pt performed 3 sets of 10 repetitions with EMST 150 set at 90 cmH2O with self-reported effort level of 7 out of 10 - "I would rather you not make it harder maybe just a half turn"  Use of vocal cord adductor (pulling during production of "ah") to facilitate much improved vocal quality, pt with much improved phonation following during conversation  PATIENT EDUCATION: Education details: see above Person educated: Patient and Spouse Education method: Explanation Education comprehension: needs further education   HOME EXERCISE PROGRAM: Perform the above 3 times per days 8 times each  GOALS: Goals reviewed with patient? Yes  SHORT TERM GOALS: Target date: 10 sessions Updated: 08/20/2023 Patient will participate in objective swallowing evaluation (MBSS) to identify safest diet recommendation as well as therapeutic targets.  Baseline:  Goal status: INITIAL: MET  2.  With Mod I, pt will demonstrate understanding of result and recommendations of MBS by verbalizing salient points.   Baseline:  Goal status: INITIAL: Goal met as pt is reliant on his wife for understanding d/t Mild Cognitive Impairment  3.  The patient will maximize voice quality and loudness using breath support/oral resonance for sustained vowel production, pitch glides, and hierarchal speech drill.   Baseline:  Goal status: INITIAL; MET- upgraded to The patient will maximize voice quality and loudness using breath support/oral resonance for sentence level utterances with Min A.  - progress made   LONG TERM GOALS: Target date: 09/10/2023 Updated:08/20/2023 Patient will improve perception of swallowing as indicated by an improvement in EAT-10 score by 12 weeks from initial swallowing therapy session.  Baseline:  Goal status: INITIAL: Ongoing; progress made  2.  The patient will be independent for abdominal breathing and breath support exercises.   Baseline:  Goal status: INITIAL:Ongoing: progress made  3.  The patient will demonstrate independent understanding of vocal hygiene concepts.   Baseline:  Goal status: INITIAL: Ongoing: progress made  4.  Patient will increase MEP by 10% indicating improved breath support, cough function, and respiratory strength for adequate speech function.: progress made   ASSESSMENT:  CLINICAL IMPRESSION: Patient is a 77 y.o. male who was seen today for behavioral voice therapy.  Pt presents with improving moderate dysphonia that is c/b hoarse, breathy, low vocal intensity.  In addition he presents with s/s concerning for pharyngeal phase dysphagia when consuming thin liquids. Modified Barium Swallow Study performed on 07/18/2023 with recommendation for thin liquids via spoon.   Pt has follow up appt with ENT on 10/02/2023. Pt and his wife are hopeful  for vocal cord movement. See the above treatment note for details.    OBJECTIVE IMPAIRMENTS include voice disorder and dysphagia. These impairments are limiting patient from effectively communicating at home and in community and safety when swallowing. Factors affecting potential to achieve goals and functional outcome are  pt's self-confessed pessimism . Patient will benefit from skilled SLP services to address above impairments and improve overall function.  REHAB POTENTIAL: Fair severity of impairments; pt's self-confused pessimism  PLAN: SLP FREQUENCY: 1-2x/week  SLP DURATION: 8 weeks  PLANNED INTERVENTIONS: SLP instruction and feedback and Patient/family  education    Louella Medaglia B. Dreama Saa, M.S., CCC-SLP, Tree surgeon Certified Brain Injury Specialist [facility]  [facility] Office [phone] Ascom [phone] Fax [phone]

## 2023-10-02 ENCOUNTER — Ambulatory Visit: Payer: Medicare Other | Admitting: Speech Pathology

## 2023-10-06 ENCOUNTER — Ambulatory Visit: Payer: Medicare Other | Admitting: Speech Pathology

## 2023-10-07 ENCOUNTER — Ambulatory Visit: Payer: Medicare Other | Admitting: Speech Pathology

## 2023-10-07 DIAGNOSIS — J3801 Paralysis of vocal cords and larynx, unilateral: Secondary | ICD-10-CM

## 2023-10-07 DIAGNOSIS — R490 Dysphonia: Secondary | ICD-10-CM | POA: Diagnosis not present

## 2023-10-07 NOTE — Therapy (Unsigned)
OUTPATIENT SPEECH LANGUAGE PATHOLOGY  TREATMENT NOTE 10th VISIT PROGRESS NOTE    Patient Name: Adrian Lewis MRN: [id number] DOB:[birth year], 77 y.o., male Today's Date: 10/07/2023  PCP: Dorothey Baseman, MD REFERRING PROVIDER: Geanie Logan, MD  Speech Therapy Progress Note  Dates of Reporting Period: 08/25/2023 to 10/07/2023  Objective: Patient has been seen for 10 speech therapy sessions this reporting period targeting pt's mild to moderate dysphonia  and mild pharyngeal dysphagia. Patient is making progress toward LTGs and met 3 STGs this reporting period. See skilled intervention, clinical impressions, and goals below for details.   End of Session - 10/07/23 1416     Visit Number 20    Number of Visits 29    Date for SLP Re-Evaluation 11/03/23    Authorization Type Medicare A/Medicare B    Progress Note Due on Visit 20    SLP Start Time 1400    SLP Stop Time  1445    SLP Time Calculation (min) 45 min    Activity Tolerance Patient tolerated treatment well             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   Elevated hemoglobin A1c 08/07/2021   Excessive drinking of alcohol 01/01/2023   01/01/23 - reported 4 hard ciders (4.5% ABV) nightly   Generalized anxiety disorder 06/16/2017   GERD (gastroesophageal reflux disease)    History of hernia repair 05/14/2012   History of kidney stones    Hyperlipidemia, mixed 12/19/2020   Hyponatremia 10/08/2013   Hypothyroidism    Insomnia due to medical condition 06/16/2017   Macular degeneration    Major depressive disorder    Medication overdose 06/16/2017   Mild cognitive impairment of uncertain or unknown etiology 01/01/2023   Nephrolithiasis 01/25/2020    Obstructive sleep apnea 10/07/2013   Osteopenia 04/04/2014   Paresthesia of both feet 12/03/2022   Radiculopathy 02/24/2013   Rising PSA level 08/07/2021   Shortness of breath on exertion 12/19/2020   Tremor 03/19/2022   Past Surgical History:  Procedure Laterality Date   CATARACT EXTRACTION W/ INTRAOCULAR LENS IMPLANT  2011 (APPROX)   RIGHT EYE AND REPAIR DETACHED RETINA   CYSTOSCOPY WITH RETROGRADE PYELOGRAM, URETEROSCOPY AND STENT PLACEMENT Bilateral 02/23/2021   Procedure: CYSTOSCOPY WITH RETROGRADE PYELOGRAM, URETEROSCOPY AND STENT PLACEMENT;  Surgeon: Sebastian Ache, MD;  Location: [facility];  Service: Urology;  Laterality: Bilateral;  75 MINS   EYE SURGERY     HAMMER TOE SURGERY  2012  &  2010  (APPROX)   ONE TOE , EACH FOOT   HERNIA REPAIR Bilateral    Inguinal Hernia Repair   HOLMIUM LASER APPLICATION Bilateral 02/23/2021   Procedure: HOLMIUM LASER APPLICATION;  Surgeon: Sebastian Ache, MD;  Location: [facility];  Service: Urology;  Laterality: Bilateral;   JOINT REPLACEMENT Left 2014   Partial Knee Replacement   KNEE SURGERY Left    x 2  partial and reconstructive   NASAL TURBINATE REDUCTION Bilateral 09/27/2015   Procedure: TURBINATE REDUCTION/SUBMUCOSAL RESECTION;  Surgeon: Geanie Logan, MD;  Location: [facility];  Service: ENT;  Laterality: Bilateral;   REPAIR RIGHT INGUINAL HERNIA W/ MESH  02-14-2006   SEPTOPLASTY N/A 09/27/2015   Procedure: SEPTOPLASTY;  Surgeon: Geanie Logan, MD;  Location: [facility];  Service: ENT;  Laterality: N/A;   URETEROSCOPY  08/12/2012   Procedure: URETEROSCOPY;  Surgeon: Sebastian Ache, MD;  Location: [facility];  Service: Urology;  Laterality: Left;  1 HR    Patient Active Problem List   Diagnosis Date Noted   Neuropathic pain of both feet 04/15/2023   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   Chronic pain syndrome 04/15/2023   Mild cognitive impairment 01/01/2023   Excessive drinking of alcohol 01/01/2023    Major depressive disorder 12/31/2022   GERD (gastroesophageal reflux disease) 12/31/2022   Macular degeneration 12/31/2022   Paresthesia of both feet 12/03/2022   Tremor 03/19/2022   Circadian rhythm sleep disorder, delayed sleep phase type 01/21/2022   Bipolar disorder II, in full remission, most recent episode depressed 01/21/2022   High risk medication use 01/21/2022   Elevated hemoglobin A1c 08/07/2021   Rising PSA level 08/07/2021   Hyperlipidemia, mixed 12/19/2020   Benign essential hypertension 12/19/2020   Abnormal ECG 12/19/2020   Shortness of breath on exertion 12/19/2020   Nephrolithiasis 01/25/2020   Medication overdose 06/16/2017   Insomnia due to medical condition 06/16/2017   Generalized anxiety disorder 06/16/2017   Osteopenia 04/04/2014   Hyponatremia 10/08/2013   Obstructive sleep apnea 10/07/2013   Hypothyroidism 10/07/2013   Bilateral sciatica 10/07/2013   Back pain 02/24/2013   Radiculopathy 02/24/2013   History of hernia repair 05/14/2012    ONSET DATE:  03/27/2023; date of referral 07/10/2023  REFERRING DIAG: 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 Laryngoscopy revealed hypomobility of right vocal cord and complete immobility of left vocal cord.   Modified Barium Swallow Study 05/12/2023 No aspiration observed, continue regular diet with thin liquids  06/26/2023 Left hemithyroidectomy  Bedside Swallow  Evaluation Recommend continue regular diet with thin liquids, head turn to pt's left to reduce s/s of aspiration at bedside  07/10/2023 Referral received from Dr Geanie Logan Laryngoscopy revealed "left cord as immobile, right cord does move although there may be some limited abduction."    PAIN:  Are you having pain? No   FALLS: Has patient fallen in last 6 months? No,  LIVING ENVIRONMENT: Lives with: lives with their spouse Lives in: House/apartment  PLOF: Independent  PATIENT GOALS    to swallow safely and improve vocal quality  SUBJECTIVE STATEMENT: Pt had appt with ENT since last ST session Pt accompanied by: significant other  OBJECTIVE:   TODAY'S TREATMENT:  Skilled treatment session focused on pt's dysphonia. SLP facilitated session by providing the following interventions:  Pt and his wife arrive following ENT appt last week. Pt also had a tooth removed so advised not to perform EMST until healed. Pt continues to be Min A to supervision level for improved respiratory support during phonation. As a result, his speech intelligibility continues to improve at the conversation level > 85%. Functionally, his wife reports "I hardly ever say, what did you say or I can' hear you." He also reports being able to talk on the phone and have  conversation with workers at his house with improved speech intelligibility.    PATIENT EDUCATION: Education details: see above Person educated: Patient and Spouse Education method: Explanation Education comprehension: needs further education   HOME EXERCISE PROGRAM: Perform the above 3 times per days 8 times each  GOALS: Goals reviewed with patient? Yes  SHORT TERM GOALS: Target date: 10 sessions Updated: 09/25/202 Updated: 10/07/2023 Patient will participate in objective swallowing evaluation (MBSS) to identify safest diet recommendation as well as therapeutic targets.  Baseline: Goal status: INITIAL: MET  2.  With Mod I, pt will  demonstrate understanding of result and recommendations of MBS by verbalizing salient points.   Baseline:  Goal status: INITIAL: Goal met as pt is reliant on his wife for understanding d/t Mild Cognitive Impairment  3.  The patient will maximize voice quality and loudness using breath support/oral resonance for sustained vowel production, pitch glides, and hierarchal speech drill.   Baseline:  Goal status: INITIAL; MET- upgraded to The patient will maximize voice quality and loudness using breath support/oral resonance for sentence level utterances with Min A. - progress made: Goal MET, upgraded to The patient will maximize voice quality and loudness using breath support/oral resonance for 10 minute conversation with supervision level cues.    LONG TERM GOALS: Target date: 09/10/2023 Updated:08/20/2023 Updated: 10/07/2023 Patient will improve perception of swallowing as indicated by an improvement in EAT-10 score by 12 weeks from initial swallowing therapy session.  Baseline:  Goal status: INITIAL: Ongoing; progress made: progress made  2.  The patient will be independent for abdominal breathing and breath support exercises.   Baseline:  Goal status: INITIAL:Ongoing: progress made: Progress made  3.  The patient will demonstrate independent understanding of vocal hygiene concepts.   Baseline:  Goal status: INITIAL: Ongoing: progress made: progress made  4.  Patient will increase MEP by 10% indicating improved breath support, cough function, and respiratory strength for adequate speech function.: progress made: continued progress made increased from 90 cmH2O to 95 cm H2O   ASSESSMENT:  CLINICAL IMPRESSION: Patient is a 77 y.o. male who was seen today for behavioral voice therapy.  Pt presents with improving moderate dysphonia that is c/b hoarse, breathy, low vocal intensity.  In addition he presents with s/s concerning for pharyngeal phase dysphagia when consuming thin liquids.  Modified Barium Swallow Study performed on 07/18/2023 with recommendation for thin liquids via spoon.   Pt with ENT appt and note received by this Clinical research associate. Note states "Previously I had felt like he had bilateral cord paralysis prior to his thyroid surgery. The right cord has shown progressive return to mobility and is essentially normal now, but the left cord remains paralyzed. He seems to be making progress with voice therapy and has follow-up with laryngology at [facility] in January. While there has been some mild tendency to get strangled and aspirate slightly he has had o issues with this and has a good cough reflex. He will continue follow-up with voice therapy." See the above treatment note for details.    OBJECTIVE IMPAIRMENTS include voice disorder and dysphagia. These impairments are limiting patient from effectively communicating at home and in community and safety when swallowing. Factors affecting potential to achieve goals and functional outcome are  pt's self-confessed pessimism . Patient will benefit from skilled SLP services to address above impairments and improve overall function.  REHAB POTENTIAL: Fair severity of impairments; pt's self-confused pessimism  PLAN: SLP FREQUENCY: 1-2x/week  SLP DURATION: 8 weeks  PLANNED INTERVENTIONS: SLP instruction  and feedback and Patient/family education    Bronc Brosseau B. Dreama Saa, M.S., CCC-SLP, CBIS Speech-Language Pathologist Certified Brain Injury Specialist [facility]  [facility] [phone] Ascom [phone] Fax [phone] '

## 2023-10-08 ENCOUNTER — Encounter: Payer: Medicare Other | Admitting: Speech Pathology

## 2023-10-08 ENCOUNTER — Ambulatory Visit (INDEPENDENT_AMBULATORY_CARE_PROVIDER_SITE_OTHER): Payer: Medicare Other | Admitting: Podiatry

## 2023-10-08 ENCOUNTER — Encounter: Payer: Self-pay | Admitting: Podiatry

## 2023-10-08 DIAGNOSIS — M19072 Primary osteoarthritis, left ankle and foot: Secondary | ICD-10-CM | POA: Diagnosis not present

## 2023-10-08 DIAGNOSIS — M79676 Pain in unspecified toe(s): Secondary | ICD-10-CM | POA: Diagnosis not present

## 2023-10-08 DIAGNOSIS — B351 Tinea unguium: Secondary | ICD-10-CM | POA: Diagnosis not present

## 2023-10-08 DIAGNOSIS — D2372 Other benign neoplasm of skin of left lower limb, including hip: Secondary | ICD-10-CM | POA: Diagnosis not present

## 2023-10-08 MED ORDER — TRIAMCINOLONE ACETONIDE 40 MG/ML IJ SUSP
20.0000 mg | Freq: Once | INTRAMUSCULAR | Status: AC
  Administered 2023-10-08: 20 mg

## 2023-10-08 NOTE — Progress Notes (Signed)
He presents today chief complaint of painful arthritic changes to the forefoot left.  He is also requesting a nail trim.  Objective: Vital signs stable he is alert oriented x 3 pulses are palpable.  Severe osteoarthritis of the midfoot bilaterally he has hallux valgus with overlapping second toe on the right foot and the majority of his pain is located on the forefoot left.  Sitting the injections to the dorsal aspect of the foot last visit has lasted him thus far.  Toenails are long thick yellow dystrophic onychomycotic multiple benign skin lesions bilateral no open lesions or wounds.  He does have pain on palpation to the third interdigital space and and and range of motion of the third and fourth metatarsophalangeal joints of the left foot.  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  Severe osteoarthritic changes bilateral.  Plan: Debrided toenails 1 through 5 bilateral debrided all mycotic nails for him today.  And's injected the third interdigital space today 20 mg Kenalog 5 mg of Marcaine.  He will see Nicki Guadalajara in in the near future here at the [facility] office for an accommodative type orthotic with a cork sole and a Plastizote overlay that will help accommodate not only his midfoot but his loss of fat in the forefoot.

## 2023-10-09 ENCOUNTER — Ambulatory Visit: Payer: Medicare Other | Admitting: Speech Pathology

## 2023-10-09 ENCOUNTER — Other Ambulatory Visit: Payer: Self-pay | Admitting: Psychiatry

## 2023-10-10 ENCOUNTER — Encounter: Payer: Self-pay | Admitting: Podiatry

## 2023-10-10 LAB — LITHIUM LEVEL: Lithium Lvl: 0.7 mmol/L (ref 0.5–1.2)

## 2023-10-11 ENCOUNTER — Other Ambulatory Visit: Payer: Self-pay | Admitting: Psychiatry

## 2023-10-11 DIAGNOSIS — F3176 Bipolar disorder, in full remission, most recent episode depressed: Secondary | ICD-10-CM

## 2023-10-13 ENCOUNTER — Encounter: Payer: Self-pay | Admitting: Psychiatry

## 2023-10-13 ENCOUNTER — Telehealth (INDEPENDENT_AMBULATORY_CARE_PROVIDER_SITE_OTHER): Payer: Medicare Other | Admitting: Psychiatry

## 2023-10-13 ENCOUNTER — Encounter: Payer: Medicare Other | Admitting: Speech Pathology

## 2023-10-13 DIAGNOSIS — F3181 Bipolar II disorder: Secondary | ICD-10-CM | POA: Diagnosis not present

## 2023-10-13 DIAGNOSIS — G3184 Mild cognitive impairment, so stated: Secondary | ICD-10-CM | POA: Diagnosis not present

## 2023-10-13 DIAGNOSIS — G4701 Insomnia due to medical condition: Secondary | ICD-10-CM | POA: Diagnosis not present

## 2023-10-13 DIAGNOSIS — Z79899 Other long term (current) drug therapy: Secondary | ICD-10-CM

## 2023-10-13 NOTE — Progress Notes (Unsigned)
Virtual Visit via Video Note  I connected with Adrian Lewis on 10/13/23 at  3:00 PM EST by a video enabled telemedicine application and verified that I am speaking with the correct person using two identifiers.  Location Provider Location : [facility] Patient Location : Home  Participants: Patient , spouse, provider    I discussed the limitations of evaluation and management by telemedicine and the availability of in person appointments. The patient expressed understanding and agreed to proceed.    I discussed the assessment and treatment plan with the patient. The patient was provided an opportunity to ask questions and all were answered. The patient agreed with the plan and demonstrated an understanding of the instructions.   The patient was advised to call back or seek an in-person evaluation if the symptoms worsen or if the condition fails to improve as anticipated.   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  Patient as well as spouse reports patient is currently going through an episode of depression.  Patient does report psychosocial stressors of going through the recent election.  Patient reports he did well on the higher dosage of lithium.  He currently takes lithium 675 mg daily.  Lithium is the only medication that works for him.  He is interested in a dosage increase.  Patient however on Telmisartan as well as has a history of junctional  bradycardia, currently under the care of cardiology/electrophysiologist.  Reports Telmisartan dosage was recently reduced to 20 mg since it was not helpful with his blood pressure.  Patient aware of the fact that lithium and telmisartan does have a drug to drug interaction which could lead to elevated lithium levels/lithium toxicity.  Patient reports sleep as restless due to lack of sleep hygiene.  He takes the medication late and goes to bed late at around 11:30 PM.  According to spouse patient takes long naps during the day which also could be affecting sleep.  He currently takes Zaleplon at night.  Patient appeared to be alert, oriented to person place time situation.  Patient denies any suicidality, homicidality or perceptual disturbances.  Patient denies any other concerns today.  Visit Diagnosis:    ICD-10-CM   1. Bipolar 2 disorder, major depressive episode (HCC)  F31.81    moderate    2. Insomnia due to medical condition  G47.01    OSA with CPAP,mood  symptoms    3. Mild cognitive impairment  G31.84     4. High risk medication use  Z79.899       Past Psychiatric History: I have not past psychiatric history from progress note on 01/21/2022.  Past trials of medications like Lunesta, Sonata, Belsomra-cost, Wellbutrin XL-did not tolerate higher dosage of 300 mg.  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   Elevated hemoglobin A1c 08/07/2021   Excessive drinking of alcohol 01/01/2023   01/01/23 - reported 4 hard ciders (4.5% ABV) nightly   Generalized anxiety disorder 06/16/2017   GERD (gastroesophageal reflux disease)    History of hernia repair 05/14/2012   History of kidney stones     Hyperlipidemia, mixed 12/19/2020   Hyponatremia 10/08/2013   Hypothyroidism    Insomnia due to medical condition 06/16/2017   Macular degeneration    Major depressive disorder    Medication overdose 06/16/2017   Mild cognitive impairment of uncertain or unknown etiology 01/01/2023   Nephrolithiasis 01/25/2020   Obstructive sleep apnea 10/07/2013   Osteopenia 04/04/2014   Paresthesia of both feet 12/03/2022   Radiculopathy 02/24/2013   Rising PSA level 08/07/2021   Shortness of breath on exertion 12/19/2020   Tremor 03/19/2022    Past Surgical History:  Procedure Laterality Date   CATARACT EXTRACTION W/ INTRAOCULAR LENS IMPLANT  2011 (APPROX)   RIGHT EYE AND REPAIR DETACHED RETINA   CYSTOSCOPY WITH RETROGRADE PYELOGRAM, URETEROSCOPY AND STENT PLACEMENT Bilateral 02/23/2021   Procedure: CYSTOSCOPY WITH RETROGRADE PYELOGRAM, URETEROSCOPY AND STENT PLACEMENT;  Surgeon: Sebastian Ache, MD;  Location: [facility];  Service: Urology;  Laterality: Bilateral;  75 MINS   EYE SURGERY     HAMMER TOE SURGERY  2012  &  2010  (APPROX)   ONE TOE , EACH FOOT   HERNIA REPAIR Bilateral    Inguinal Hernia Repair   HOLMIUM LASER APPLICATION Bilateral 02/23/2021   Procedure: HOLMIUM LASER APPLICATION;  Surgeon: Sebastian Ache, MD;  Location: [facility];  Service: Urology;  Laterality: Bilateral;   JOINT REPLACEMENT Left 2014   Partial Knee Replacement   KNEE SURGERY Left    x 2  partial and reconstructive   NASAL TURBINATE REDUCTION Bilateral 09/27/2015   Procedure: TURBINATE REDUCTION/SUBMUCOSAL RESECTION;  Surgeon: Geanie Logan, MD;  Location: [facility];  Service: ENT;  Laterality: Bilateral;   REPAIR RIGHT INGUINAL HERNIA W/ MESH  02-14-2006   SEPTOPLASTY N/A 09/27/2015   Procedure: SEPTOPLASTY;  Surgeon: Geanie Logan, MD;  Location: [facility];  Service: ENT;  Laterality: N/A;   URETEROSCOPY  08/12/2012   Procedure: URETEROSCOPY;  Surgeon: Sebastian Ache, MD;  Location: [facility];  Service:  Urology;  Laterality: Left;  1 HR     Family Psychiatric History: I have reviewed family psychiatric history from progress note on 01/21/2022.  Family History:  Family History  Problem Relation Age of Onset   Dementia Mother        with advanced age; ~24s   Heart attack Father    Suicidality Sister    Suicidality Brother    Parkinson's disease Maternal Aunt     Social History: I have reviewed social history from progress note on 01/21/2022. Social History   Socioeconomic History   Marital status: Married    Spouse name: Not on file   Number of children: 2   Years of education: 20   Highest education level: Doctorate  Occupational History   Occupation: Retired    Comment: English professor  Tobacco Use   Smoking status: Never   Smokeless tobacco: Never  Vaping Use   Vaping status: Never Used  Substance and Sexual Activity   Alcohol use: Yes    Alcohol/week: 28.0 standard drinks of alcohol    Types: 28 Standard drinks or equivalent per week    Comment: 4 hard ciders nightly   Drug use:  No   Sexual activity: Not Currently  Other Topics Concern   Not on file  Social History Narrative   Right handed   Drinks caffeine   Two story home   Lives with wife in the home   retired   Chief Executive Officer Determinants of Health   Financial Resource Strain: Low Risk  (10/01/2023)   [facility]   Overall Financial Resource Strain (CARDIA)    Difficulty of Paying Living Expenses: Not hard at all  Food Insecurity: No Food Insecurity (10/01/2023)   Received from [facility]   Hunger Vital Sign    Worried About Running Out of Food in the Last Year: Never true    Ran Out of Food in the Last Year: Never true  Transportation Needs: No Transportation Needs (10/01/2023)   Received from [facility] - Transportation    In the past 12 months, has lack of transportation kept you from medical appointments or from getting  medications?: No    Lack of Transportation (Non-Medical): No  Physical Activity: Not on file  Stress: Not on file  Social Connections: Not on file    Allergies:  Allergies  Allergen Reactions   Bee Venom Anaphylaxis    Has EPI pen for this   Cat Hair Extract Other (See Comments)    Per pt his nose runs/congestion.    Clonidine Other (See Comments)    fatigue fatigue fatigue   Grass Pollen(K-O-R-T-Swt Vern) Other (See Comments)    Runny nose   Other Other (See Comments)    Per pt his nose runs/congestion.    Metabolic Disorder Labs: No results found for: "HGBA1C", "MPG" No results found for: "PROLACTIN" No results found for: "CHOL", "TRIG", "HDL", "CHOLHDL", "VLDL", "LDLCALC" Lab Results  Component Value Date   TSH 1.310 11/06/2022   TSH 1.030 03/04/2022    Therapeutic Level Labs: Lab Results  Component Value Date   LITHIUM 0.7 10/09/2023   LITHIUM 0.5 08/26/2023   No results found for: "VALPROATE" No results found for: "CBMZ"  Current Medications: Current Outpatient Medications  Medication Sig Dispense Refill   acetaminophen (TYLENOL) 500 MG tablet Take by mouth.     Ascorbic Acid (VITAMIN C) 1000 MG tablet Take 1,000 mg by mouth daily.     b complex vitamins capsule Take 1 capsule by mouth daily.     buPROPion (WELLBUTRIN SR) 200 MG 12 hr tablet Take 1 tablet (200 mg total) by mouth in the morning. 90 tablet 1   Coenzyme Q10 (COQ10 PO) Take 60 mg by mouth daily.     cyanocobalamin (VITAMIN B12) 1000 MCG tablet Take by mouth.     ergocalciferol (VITAMIN D2) 1.25 MG (50000 UT) capsule Take 50,000 Units by mouth 2 (two) times a week.     lamoTRIgine (LAMICTAL) 100 MG tablet TAKE 1 TABLET BY MOUTH DAILY 90 tablet 3   levothyroxine (SYNTHROID, LEVOTHROID) 25 MCG tablet Take 25 mcg by mouth Daily.      lithium carbonate (ESKALITH) 450 MG ER tablet Take 1.5 tablets (675 mg total) by mouth daily. 135 tablet 0   magnesium oxide (MAG-OX) 400 MG tablet Take by mouth.      Menaquinone-7 (VITAMIN K2) 100 MCG CAPS Take 1 capsule by mouth daily. (Patient not taking: Reported on 08/05/2023)     Misc Natural Products (PROSTATE HEALTH PO) Take 1 capsule by mouth daily.     Multiple Vitamins-Minerals (OCUVITE PRESERVISION PO) Take 1 tablet  by mouth daily.     Omega-3 Fatty Acids (FISH OIL) 1000 MG CAPS Take 1,000 mg by mouth daily.     OVER THE COUNTER MEDICATION Take 1 capsule by mouth daily. Adrenogen     potassium citrate (UROCIT-K) 10 MEQ (1080 MG) SR tablet 1 tablet with meals     Probiotic Product (PROBIOTIC DAILY PO) Take 1 capsule by mouth daily.     Quercetin 500 MG CAPS Take 1 capsule by mouth daily.     RESVERATROL PO Take 500 mg by mouth daily.     S-Adenosylmethionine (SAME) 400 MG TABS Take 400 mg by mouth daily.     senna-docusate (SENOKOT-S) 8.6-50 MG tablet Take 1 tablet by mouth 2 (two) times daily. While taking strongest pain meds to prevent constipation. 10 tablet 0   sodium fluoride (FLUORISHIELD) 1.1 % GEL dental gel Place onto teeth.     telmisartan (MICARDIS) 20 MG tablet Take 20 mg by mouth daily.     theophylline (THEO-24) 300 MG 24 hr capsule Take 300 mg by mouth daily. Takes 200 mg for a week , 100 mg for another week and stop     Tryptophan 500 MG CAPS Take 1 capsule by mouth at bedtime.     zaleplon (SONATA) 10 MG capsule Take 1 capsule (10 mg total) by mouth at bedtime as needed for sleep. 30 capsule 3   No current facility-administered medications for this visit.     Musculoskeletal: Strength & Muscle Tone:  UTA Gait & Station:  Seated Patient leans: N/A  Psychiatric Specialty Exam: Review of Systems  Psychiatric/Behavioral:  Positive for dysphoric mood and sleep disturbance.     There were no vitals taken for this visit.There is no height or weight on file to calculate BMI.  General Appearance: Fairly Groomed  Eye Contact:  Fair  Speech:  Clear and Coherent  Volume:  Normal  Mood:  Depressed  Affect:  Congruent  Thought  Process:  Goal Directed and Descriptions of Associations: Intact  Orientation:  Full (Time, Place, and Person)  Thought Content: Logical   Suicidal Thoughts:  No  Homicidal Thoughts:  No  Memory:  Immediate;   Fair Recent;   Fair Remote;   Fair  Judgement:  Fair  Insight:  Fair  Psychomotor Activity:  Normal  Concentration:  Concentration: Fair and Attention Span: Fair  Recall:  [employer] of Knowledge: Fair  Language: Fair  Akathisia:  No  Handed:  Right  AIMS (if indicated): not done  Assets:  Desire for Improvement Housing Social Support Transportation  ADL's:  Intact  Cognition: WNL  Sleep:   Restless due to lack of sleep hygiene   Screenings: GAD-7    Flowsheet Row Office Visit from 04/01/2023 in [facility]  Total GAD-7 Score 0      PHQ2-9    Flowsheet Row Office Visit from 07/17/2023 in [facility] Visit from 04/01/2023 in [facility] Video Visit from 11/26/2022 in [facility] Video Visit from 10/10/2022 in [facility] Video Visit from 07/17/2022 in [facility]  PHQ-2 Total Score 0 1 0 0 3  PHQ-9 Total Score -- 5 -- -- 9      Flowsheet Row Video Visit from 10/13/2023 in [facility] Video Visit from 08/28/2023 in [facility]  Video Visit from 07/03/2023 in [facility]  C-SSRS RISK CATEGORY No Risk No Risk No Risk        Assessment and Plan: Adrian Lewis is a 77 year old Caucasian male who has a history of bipolar disorder type II, insomnia, cognitive disorder likely mild status post thyroidectomy, currently with worsening depression symptoms, patient is interested in dosage  increase of lithium however does have a history of junctional bradycardia as well as is on medications like telmisartan which has a drug to drug interaction with lithium, discussed plan as noted below.  Plan Bipolar disorder type II depressed-unstable We will consider readjusting the dosage of lithium back to 450 mg twice daily.  However will need clearance from cardiology. Continue lithium 675 mg p.o. daily for now.   Discussed increasing the dosage of Lamictal, patient declines.  Continue Lamictal 100 mg p.o. daily.   Insomnia-improving Currently restless due to lack of sleep hygiene, patient encouraged to work on sleep hygiene techniques. Sonata 10 mg p.o. nightly Continue CPAP for OSA Reviewed [place] PMP AWARxE  Mild neurocognitive disorder-chronic MOCA - 03/27/22- 25/30. Patient to follow up with neurology as needed.  High risk medication use-reviewed and discussed most recent lithium level-0.7-11 14 2024.  Therapeutic Patient is currently on a lower dosage of telmisartan-patient to repeat lithium level in a week from now.  Once that is reviewed and be obtained cardiology clearance will consider readjusting the dosage of lithium as needed.  Collateral information obtained from spouse who reports patient may benefit from the higher dosage of lithium due to recent worsening of mood symptoms.  Collaboration of Care: Collaboration of Care: Other patient encouraged to follow up with cardiology for clearance.  Patient/Guardian was advised Release of Information must be obtained prior to any record release in order to collaborate their care with an outside provider. Patient/Guardian was advised if they have not already done so to contact the registration department to sign all necessary forms in order for [place] to release information regarding their care.   Consent: Patient/Guardian gives verbal consent for treatment and assignment of benefits for services provided during this visit.  Patient/Guardian expressed understanding and agreed to proceed.   I have spent atleast 40 minutes face to face with patient today which includes the time spent for preparing to see the patient ( e.g., review of test, records ), obtaining and to review and separately obtained history including collateral information from spouse, ordering medications and test ,psychoeducation and supportive psychotherapy and care coordination,as well as documenting clinical information in electronic health record,interpreting and communication of test results.  This note was generated in part or whole with voice recognition software. Voice recognition is usually quite accurate but there are transcription errors that can and very often do occur. I apologize for any typographical errors that were not detected and corrected.     Jomarie Longs, MD 10/14/2023, 4:01 PM

## 2023-10-14 ENCOUNTER — Ambulatory Visit: Payer: Medicare Other | Admitting: Speech Pathology

## 2023-10-14 DIAGNOSIS — R490 Dysphonia: Secondary | ICD-10-CM

## 2023-10-14 DIAGNOSIS — J3801 Paralysis of vocal cords and larynx, unilateral: Secondary | ICD-10-CM

## 2023-10-14 NOTE — Therapy (Signed)
OUTPATIENT SPEECH LANGUAGE PATHOLOGY  TREATMENT NOTE 10th VISIT PROGRESS NOTE    Patient Name: Adrian Lewis MRN: [id number] DOB:09-May-1946, 77 y.o., male Today's Date: 10/14/2023  PCP: Dorothey Baseman, MD REFERRING PROVIDER: Geanie Logan, MD  Speech Therapy Progress Note  Dates of Reporting Period: 08/25/2023 to 10/07/2023  Objective: Patient has been seen for 10 speech therapy sessions this reporting period targeting pt's mild to moderate dysphonia  and mild pharyngeal dysphagia. Patient is making progress toward LTGs and met 3 STGs this reporting period. See skilled intervention, clinical impressions, and goals below for details.   End of Session - 10/14/23 1617     Visit Number 21    Number of Visits 29    Date for SLP Re-Evaluation 11/03/23    Authorization Type Medicare A/Medicare B    Progress Note Due on Visit 30    SLP Start Time 1530    SLP Stop Time  1615    SLP Time Calculation (min) 45 min    Activity Tolerance Patient tolerated treatment well             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   Elevated hemoglobin A1c 08/07/2021   Excessive drinking of alcohol 01/01/2023   01/01/23 - reported 4 hard ciders (4.5% ABV) nightly   Generalized anxiety disorder 06/16/2017   GERD (gastroesophageal reflux disease)    History of hernia repair 05/14/2012   History of kidney stones    Hyperlipidemia, mixed 12/19/2020   Hyponatremia 10/08/2013   Hypothyroidism    Insomnia due to medical condition 06/16/2017   Macular degeneration    Major depressive disorder    Medication overdose 06/16/2017   Mild cognitive impairment of uncertain or unknown etiology 01/01/2023   Nephrolithiasis 01/25/2020    Obstructive sleep apnea 10/07/2013   Osteopenia 04/04/2014   Paresthesia of both feet 12/03/2022   Radiculopathy 02/24/2013   Rising PSA level 08/07/2021   Shortness of breath on exertion 12/19/2020   Tremor 03/19/2022   Past Surgical History:  Procedure Laterality Date   CATARACT EXTRACTION W/ INTRAOCULAR LENS IMPLANT  2011 (APPROX)   RIGHT EYE AND REPAIR DETACHED RETINA   CYSTOSCOPY WITH RETROGRADE PYELOGRAM, URETEROSCOPY AND STENT PLACEMENT Bilateral 02/23/2021   Procedure: CYSTOSCOPY WITH RETROGRADE PYELOGRAM, URETEROSCOPY AND STENT PLACEMENT;  Surgeon: Sebastian Ache, MD;  Location: [facility];  Service: Urology;  Laterality: Bilateral;  75 MINS   EYE SURGERY     HAMMER TOE SURGERY  2012  &  2010  (APPROX)   ONE TOE , EACH FOOT   HERNIA REPAIR Bilateral    Inguinal Hernia Repair   HOLMIUM LASER APPLICATION Bilateral 02/23/2021   Procedure: HOLMIUM LASER APPLICATION;  Surgeon: Sebastian Ache, MD;  Location: [facility];  Service: Urology;  Laterality: Bilateral;   JOINT REPLACEMENT Left 2014   Partial Knee Replacement   KNEE SURGERY Left    x 2  partial and reconstructive   NASAL TURBINATE REDUCTION Bilateral 09/27/2015   Procedure: TURBINATE REDUCTION/SUBMUCOSAL RESECTION;  Surgeon: Geanie Logan, MD;  Location: [facility];  Service: ENT;  Laterality: Bilateral;   REPAIR RIGHT INGUINAL HERNIA W/ MESH  02-14-2006   SEPTOPLASTY N/A 09/27/2015   Procedure: SEPTOPLASTY;  Surgeon: Geanie Logan, MD;  Location: [facility];  Service: ENT;  Laterality: N/A;   URETEROSCOPY  08/12/2012   Procedure: URETEROSCOPY;  Surgeon: Sebastian Ache, MD;  Location: [facility];  Service: Urology;  Laterality: Left;  1 HR    Patient Active Problem List   Diagnosis Date Noted   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   Coronary artery disease involving native coronary artery of native heart without angina pectoris 08/14/2023   Bipolar affective disorder in remission (HCC) 06/30/2023   Neuropathic pain of  both feet 04/15/2023   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   Chronic pain syndrome 04/15/2023   Mild cognitive impairment 01/01/2023   Excessive drinking of alcohol 01/01/2023   Major depressive disorder 12/31/2022   GERD (gastroesophageal reflux disease) 12/31/2022   Macular degeneration 12/31/2022   Paresthesia of both feet 12/03/2022   Tremor 03/19/2022   Circadian rhythm sleep disorder, delayed sleep phase type 01/21/2022   Bipolar disorder II, in full remission, most recent episode depressed 01/21/2022   High risk medication use 01/21/2022   Elevated hemoglobin A1c 08/07/2021   Rising PSA level 08/07/2021   Hyperlipidemia, mixed 12/19/2020   Benign essential hypertension 12/19/2020   Abnormal ECG 12/19/2020   Shortness of breath on exertion 12/19/2020   Nephrolithiasis 01/25/2020   Medication overdose 06/16/2017   Insomnia due to medical condition 06/16/2017   Generalized anxiety disorder 06/16/2017   Osteopenia 04/04/2014   Hyponatremia 10/08/2013   Obstructive sleep apnea 10/07/2013   Hypothyroidism 10/07/2013   Bilateral sciatica 10/07/2013   Back pain 02/24/2013   Radiculopathy 02/24/2013   History of hernia repair 05/14/2012    ONSET DATE:  03/27/2023; date of referral 07/10/2023  REFERRING DIAG: 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 Laryngoscopy revealed hypomobility of right vocal cord and complete  immobility of left vocal cord.   Modified Barium Swallow Study 05/12/2023 No aspiration observed, continue regular diet with thin liquids  06/26/2023 Left hemithyroidectomy  Bedside Swallow Evaluation Recommend continue regular diet with thin liquids, head turn to pt's left to reduce s/s of aspiration at bedside  07/10/2023 Referral received from Dr Geanie Logan Laryngoscopy revealed "left cord as immobile, right cord does move although there may be some limited abduction."    PAIN:  Are you having pain? No   FALLS: Has patient fallen in last 6 months? No,  LIVING ENVIRONMENT: Lives with: lives with their spouse Lives in: House/apartment  PLOF: Independent  PATIENT GOALS    to swallow safely and improve vocal quality  SUBJECTIVE STATEMENT: Pt missed completing a week of HEP d/t having dental procedure Pt accompanied by: significant other  OBJECTIVE:   TODAY'S TREATMENT:  Skilled treatment session focused on pt's dysphonia. SLP facilitated session by providing the following interventions:  Pt with regression of vocal quality and ability to complete EMST following 1 week break after having dental work. As a result, pt required moderate verbal cues for completion and moderate cues to improve respiratory support during conversation.   PATIENT EDUCATION: Education details: see above Person educated: Patient and Spouse  Education method: Explanation Education comprehension: needs further education   HOME EXERCISE PROGRAM: Perform the above 3 times per days 8 times each  GOALS: Goals reviewed with patient? Yes  SHORT TERM GOALS: Target date: 10 sessions Updated: 09/25/202 Updated: 10/07/2023 Patient will participate in objective swallowing evaluation (MBSS) to identify safest diet recommendation as well as therapeutic targets.  Baseline: Goal status: INITIAL: MET  2.  With Mod I, pt will demonstrate understanding of result and recommendations of MBS by verbalizing  salient points.   Baseline:  Goal status: INITIAL: Goal met as pt is reliant on his wife for understanding d/t Mild Cognitive Impairment  3.  The patient will maximize voice quality and loudness using breath support/oral resonance for sustained vowel production, pitch glides, and hierarchal speech drill.   Baseline:  Goal status: INITIAL; MET- upgraded to The patient will maximize voice quality and loudness using breath support/oral resonance for sentence level utterances with Min A. - progress made: Goal MET, upgraded to The patient will maximize voice quality and loudness using breath support/oral resonance for 10 minute conversation with supervision level cues.    LONG TERM GOALS: Target date: 09/10/2023 Updated:08/20/2023 Updated: 10/07/2023 Patient will improve perception of swallowing as indicated by an improvement in EAT-10 score by 12 weeks from initial swallowing therapy session.  Baseline:  Goal status: INITIAL: Ongoing; progress made: progress made  2.  The patient will be independent for abdominal breathing and breath support exercises.   Baseline:  Goal status: INITIAL:Ongoing: progress made: Progress made  3.  The patient will demonstrate independent understanding of vocal hygiene concepts.   Baseline:  Goal status: INITIAL: Ongoing: progress made: progress made  4.  Patient will increase MEP by 10% indicating improved breath support, cough function, and respiratory strength for adequate speech function.: progress made: continued progress made increased from 90 cmH2O to 95 cm H2O   ASSESSMENT:  CLINICAL IMPRESSION: Patient is a 77 y.o. male who was seen today for behavioral voice therapy.  Pt presents with improving moderate dysphonia that is c/b hoarse, breathy, low vocal intensity.  In addition he presents with s/s concerning for pharyngeal phase dysphagia when consuming thin liquids. Modified Barium Swallow Study performed on 07/18/2023 with recommendation for thin  liquids via spoon.   Pt continues to benefit from skilled ST for habituation of EMST. See the above treatment note for details.    OBJECTIVE IMPAIRMENTS include voice disorder and dysphagia. These impairments are limiting patient from effectively communicating at home and in community and safety when swallowing. Factors affecting potential to achieve goals and functional outcome are  pt's self-confessed pessimism . Patient will benefit from skilled SLP services to address above impairments and improve overall function.  REHAB POTENTIAL: Fair severity of impairments; pt's self-confused pessimism  PLAN: SLP FREQUENCY: 1-2x/week  SLP DURATION: 8 weeks  PLANNED INTERVENTIONS: SLP instruction and feedback and Patient/family education    Donna Silverman B. Dreama Saa, M.S., CCC-SLP, CBIS Speech-Language Pathologist Certified Brain Injury Specialist [facility]  [facility] [phone] Ascom [phone] Fax [phone] '

## 2023-10-16 ENCOUNTER — Ambulatory Visit: Payer: Medicare Other | Admitting: Speech Pathology

## 2023-10-16 ENCOUNTER — Telehealth: Payer: Self-pay | Admitting: Psychiatry

## 2023-10-16 NOTE — Telephone Encounter (Signed)
Received a letter from cardiology office-Ms. Allayne Stack given that patient's episode of junctional bradycardia occurred at the time of his hemithyroidectomy and he has not had any recurrences since that time it would be reasonable to retry higher dosage of lithium,  900 mg daily.  However since patient has also requested his electrophysiologist to provide his opinion it is recommended to wait for electrophysiologist response given that he is the expert in the field.'  Contacted patient to discuss the above letter.  Patient agrees to wait until we get a response from electrophysiology.

## 2023-10-18 ENCOUNTER — Encounter: Payer: Self-pay | Admitting: Podiatry

## 2023-10-20 ENCOUNTER — Ambulatory Visit: Payer: Medicare Other | Admitting: Speech Pathology

## 2023-10-20 ENCOUNTER — Other Ambulatory Visit: Payer: Self-pay | Admitting: Psychiatry

## 2023-10-20 DIAGNOSIS — R490 Dysphonia: Secondary | ICD-10-CM

## 2023-10-20 DIAGNOSIS — J3801 Paralysis of vocal cords and larynx, unilateral: Secondary | ICD-10-CM

## 2023-10-20 NOTE — Therapy (Signed)
OUTPATIENT SPEECH LANGUAGE PATHOLOGY  TREATMENT NOTE    Patient Name: Adrian Lewis MRN: [id number] DOB:1946-08-03, 77 y.o., male Today's Date: 10/20/2023  PCP: Dorothey Baseman, MD REFERRING PROVIDER: Geanie Logan, MD    End of Session - 10/20/23 1411     Visit Number 22    Number of Visits 29    Date for SLP Re-Evaluation 11/03/23    Authorization Type Medicare A/Medicare B    Progress Note Due on Visit 30    SLP Start Time 1400    SLP Stop Time  1445    SLP Time Calculation (min) 45 min    Activity Tolerance Patient tolerated treatment well             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   Elevated hemoglobin A1c 08/07/2021   Excessive drinking of alcohol 01/01/2023   01/01/23 - reported 4 hard ciders (4.5% ABV) nightly   Generalized anxiety disorder 06/16/2017   GERD (gastroesophageal reflux disease)    History of hernia repair 05/14/2012   History of kidney stones    Hyperlipidemia, mixed 12/19/2020   Hyponatremia 10/08/2013   Hypothyroidism    Insomnia due to medical condition 06/16/2017   Macular degeneration    Major depressive disorder    Medication overdose 06/16/2017   Mild cognitive impairment of uncertain or unknown etiology 01/01/2023   Nephrolithiasis 01/25/2020   Obstructive sleep apnea 10/07/2013   Osteopenia 04/04/2014   Paresthesia of both feet 12/03/2022   Radiculopathy 02/24/2013   Rising PSA level 08/07/2021   Shortness of breath on exertion 12/19/2020   Tremor 03/19/2022   Past Surgical History:  Procedure Laterality Date   CATARACT EXTRACTION W/ INTRAOCULAR LENS IMPLANT  2011 (APPROX)   RIGHT EYE AND REPAIR DETACHED RETINA   CYSTOSCOPY WITH RETROGRADE PYELOGRAM,  URETEROSCOPY AND STENT PLACEMENT Bilateral 02/23/2021   Procedure: CYSTOSCOPY WITH RETROGRADE PYELOGRAM, URETEROSCOPY AND STENT PLACEMENT;  Surgeon: Sebastian Ache, MD;  Location: [facility];  Service: Urology;  Laterality: Bilateral;  75 MINS   EYE SURGERY     HAMMER TOE SURGERY  2012  &  2010  (APPROX)   ONE TOE , EACH FOOT   HERNIA REPAIR Bilateral    Inguinal Hernia Repair   HOLMIUM LASER APPLICATION Bilateral 02/23/2021   Procedure: HOLMIUM LASER APPLICATION;  Surgeon: Sebastian Ache, MD;  Location: [facility];  Service: Urology;  Laterality: Bilateral;   JOINT REPLACEMENT Left 2014   Partial Knee Replacement   KNEE SURGERY Left    x 2  partial and reconstructive   NASAL TURBINATE REDUCTION Bilateral 09/27/2015   Procedure: TURBINATE REDUCTION/SUBMUCOSAL RESECTION;  Surgeon: Geanie Logan, MD;  Location: [facility];  Service: ENT;  Laterality: Bilateral;   REPAIR RIGHT INGUINAL HERNIA W/ MESH  02-14-2006   SEPTOPLASTY N/A 09/27/2015   Procedure: SEPTOPLASTY;  Surgeon: Geanie Logan, MD;  Location: [facility];  Service: ENT;  Laterality: N/A;   URETEROSCOPY  08/12/2012   Procedure: URETEROSCOPY;  Surgeon: Sebastian Ache, MD;  Location: [facility];  Service: Urology;  Laterality: Left;  1 HR    Patient Active Problem List   Diagnosis Date Noted   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   Coronary artery disease involving native coronary artery of native heart without  angina pectoris 08/14/2023   Bipolar affective disorder in remission (HCC) 06/30/2023   Neuropathic pain of both feet 04/15/2023   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   Chronic pain syndrome 04/15/2023   Mild cognitive impairment 01/01/2023   Excessive drinking of alcohol 01/01/2023   Major depressive disorder 12/31/2022   GERD (gastroesophageal reflux disease) 12/31/2022   Macular degeneration 12/31/2022   Paresthesia of both feet 12/03/2022   Tremor 03/19/2022   Circadian rhythm sleep disorder,  delayed sleep phase type 01/21/2022   Bipolar disorder II, in full remission, most recent episode depressed 01/21/2022   High risk medication use 01/21/2022   Elevated hemoglobin A1c 08/07/2021   Rising PSA level 08/07/2021   Hyperlipidemia, mixed 12/19/2020   Benign essential hypertension 12/19/2020   Abnormal ECG 12/19/2020   Shortness of breath on exertion 12/19/2020   Nephrolithiasis 01/25/2020   Medication overdose 06/16/2017   Insomnia due to medical condition 06/16/2017   Generalized anxiety disorder 06/16/2017   Osteopenia 04/04/2014   Hyponatremia 10/08/2013   Obstructive sleep apnea 10/07/2013   Hypothyroidism 10/07/2013   Bilateral sciatica 10/07/2013   Back pain 02/24/2013   Radiculopathy 02/24/2013   History of hernia repair 05/14/2012    ONSET DATE:  03/27/2023; date of referral 07/10/2023  REFERRING DIAG: 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 Laryngoscopy revealed hypomobility of right vocal cord and complete immobility of left vocal cord.   Modified Barium Swallow Study 05/12/2023 No aspiration observed, continue regular diet with thin liquids  06/26/2023 Left hemithyroidectomy  Bedside Swallow Evaluation Recommend continue regular diet with thin liquids, head turn to pt's left to reduce s/s of aspiration at bedside  07/10/2023 Referral received from Dr Geanie Logan Laryngoscopy revealed "left cord as immobile, right  cord does move although there may be some limited abduction."    PAIN:  Are you having pain? No   FALLS: Has patient fallen in last 6 months? No,  LIVING ENVIRONMENT: Lives with: lives with their spouse Lives in: House/apartment  PLOF: Independent  PATIENT GOALS    to swallow safely and improve vocal quality  SUBJECTIVE STATEMENT: Pt arrived stating that he was practicing EMST more frequently during the day - his wife commented "the more he does that, the louder he talks" Pt accompanied by: significant other  OBJECTIVE:   TODAY'S TREATMENT:  Skilled treatment session focused on pt's dysphonia. SLP facilitated session by providing the following interventions:  Pt reports 4 out of 10 effort level when completing EMST repetitions with device set at 90 cmH2O, when increased to 110 cmH2O, pt was able to complete 3 sets of 8 with self-reported effort level of 7 out of 10 -   Conversational speech while still dysphonic was louder with increased speech intelligibility of 90% d/t increased respiratory support  PATIENT EDUCATION: Education details: see above Person educated: Patient and Spouse Education method: Explanation Education comprehension: needs further education   HOME EXERCISE PROGRAM: Perform the above 3 times per days 8 times each  GOALS: Goals reviewed  with patient? Yes  SHORT TERM GOALS: Target date: 10 sessions Updated: 09/25/202 Updated: 10/07/2023 Patient will participate in objective swallowing evaluation (MBSS) to identify safest diet recommendation as well as therapeutic targets.  Baseline: Goal status: INITIAL: MET  2.  With Mod I, pt will demonstrate understanding of result and recommendations of MBS by verbalizing salient points.   Baseline:  Goal status: INITIAL: Goal met as pt is reliant on his wife for understanding d/t Mild Cognitive Impairment  3.  The patient will maximize voice quality and loudness using breath support/oral resonance for  sustained vowel production, pitch glides, and hierarchal speech drill.   Baseline:  Goal status: INITIAL; MET- upgraded to The patient will maximize voice quality and loudness using breath support/oral resonance for sentence level utterances with Min A. - progress made: Goal MET, upgraded to The patient will maximize voice quality and loudness using breath support/oral resonance for 10 minute conversation with supervision level cues.    LONG TERM GOALS: Target date: 09/10/2023 Updated:08/20/2023 Updated: 10/07/2023 Patient will improve perception of swallowing as indicated by an improvement in EAT-10 score by 12 weeks from initial swallowing therapy session.  Baseline:  Goal status: INITIAL: Ongoing; progress made: progress made  2.  The patient will be independent for abdominal breathing and breath support exercises.   Baseline:  Goal status: INITIAL:Ongoing: progress made: Progress made  3.  The patient will demonstrate independent understanding of vocal hygiene concepts.   Baseline:  Goal status: INITIAL: Ongoing: progress made: progress made  4.  Patient will increase MEP by 10% indicating improved breath support, cough function, and respiratory strength for adequate speech function.: progress made: continued progress made increased from 90 cmH2O to 95 cm H2O   ASSESSMENT:  CLINICAL IMPRESSION: Patient is a 77 y.o. male who was seen today for behavioral voice therapy.  Pt presents with improving moderate dysphonia that is c/b hoarse, breathy, low vocal intensity.  In addition he presents with s/s concerning for pharyngeal phase dysphagia when consuming thin liquids. Modified Barium Swallow Study performed on 07/18/2023 with recommendation for thin liquids via spoon.   Pt continues to benefit from skilled ST for habituation of EMST and increased vocal intensity. See the above treatment note for details.    OBJECTIVE IMPAIRMENTS include voice disorder and dysphagia. These  impairments are limiting patient from effectively communicating at home and in community and safety when swallowing. Factors affecting potential to achieve goals and functional outcome are  pt's self-confessed pessimism . Patient will benefit from skilled SLP services to address above impairments and improve overall function.  REHAB POTENTIAL: Fair severity of impairments; pt's self-confused pessimism  PLAN: SLP FREQUENCY: 1-2x/week  SLP DURATION: 8 weeks  PLANNED INTERVENTIONS: SLP instruction and feedback and Patient/family education    Alaric Gladwin B. Dreama Saa, M.S., CCC-SLP, CBIS Speech-Language Pathologist Certified Brain Injury Specialist [facility]  [facility] [phone] Ascom [phone] Fax [phone] '

## 2023-10-21 LAB — LITHIUM LEVEL: Lithium Lvl: 0.4 mmol/L — ABNORMAL LOW (ref 0.5–1.2)

## 2023-10-27 ENCOUNTER — Ambulatory Visit: Payer: Medicare Other | Attending: Otolaryngology | Admitting: Speech Pathology

## 2023-10-27 DIAGNOSIS — R490 Dysphonia: Secondary | ICD-10-CM | POA: Insufficient documentation

## 2023-10-27 DIAGNOSIS — J3801 Paralysis of vocal cords and larynx, unilateral: Secondary | ICD-10-CM | POA: Diagnosis present

## 2023-10-27 DIAGNOSIS — R1313 Dysphagia, pharyngeal phase: Secondary | ICD-10-CM | POA: Insufficient documentation

## 2023-10-27 NOTE — Therapy (Signed)
OUTPATIENT SPEECH LANGUAGE PATHOLOGY  TREATMENT NOTE    Patient Name: Adrian Lewis MRN: [id number] DOB:March 02, 1946, 77 y.o., male Today's Date: 10/27/2023  PCP: Dorothey Baseman, MD REFERRING PROVIDER: Geanie Logan, MD    End of Session - 10/27/23 1420     Visit Number 23    Number of Visits 29    Date for SLP Re-Evaluation 11/03/23    Authorization Type Medicare A/Medicare B    Progress Note Due on Visit 30    SLP Start Time 1410    SLP Stop Time  1500    SLP Time Calculation (min) 50 min    Activity Tolerance Patient tolerated treatment well             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   Elevated hemoglobin A1c 08/07/2021   Excessive drinking of alcohol 01/01/2023   01/01/23 - reported 4 hard ciders (4.5% ABV) nightly   Generalized anxiety disorder 06/16/2017   GERD (gastroesophageal reflux disease)    History of hernia repair 05/14/2012   History of kidney stones    Hyperlipidemia, mixed 12/19/2020   Hyponatremia 10/08/2013   Hypothyroidism    Insomnia due to medical condition 06/16/2017   Macular degeneration    Major depressive disorder    Medication overdose 06/16/2017   Mild cognitive impairment of uncertain or unknown etiology 01/01/2023   Nephrolithiasis 01/25/2020   Obstructive sleep apnea 10/07/2013   Osteopenia 04/04/2014   Paresthesia of both feet 12/03/2022   Radiculopathy 02/24/2013   Rising PSA level 08/07/2021   Shortness of breath on exertion 12/19/2020   Tremor 03/19/2022   Past Surgical History:  Procedure Laterality Date   CATARACT EXTRACTION W/ INTRAOCULAR LENS IMPLANT  2011 (APPROX)   RIGHT EYE AND REPAIR DETACHED RETINA   CYSTOSCOPY WITH RETROGRADE PYELOGRAM,  URETEROSCOPY AND STENT PLACEMENT Bilateral 02/23/2021   Procedure: CYSTOSCOPY WITH RETROGRADE PYELOGRAM, URETEROSCOPY AND STENT PLACEMENT;  Surgeon: Sebastian Ache, MD;  Location: [facility];  Service: Urology;  Laterality: Bilateral;  75 MINS   EYE SURGERY     HAMMER TOE SURGERY  2012  &  2010  (APPROX)   ONE TOE , EACH FOOT   HERNIA REPAIR Bilateral    Inguinal Hernia Repair   HOLMIUM LASER APPLICATION Bilateral 02/23/2021   Procedure: HOLMIUM LASER APPLICATION;  Surgeon: Sebastian Ache, MD;  Location: [facility];  Service: Urology;  Laterality: Bilateral;   JOINT REPLACEMENT Left 2014   Partial Knee Replacement   KNEE SURGERY Left    x 2  partial and reconstructive   NASAL TURBINATE REDUCTION Bilateral 09/27/2015   Procedure: TURBINATE REDUCTION/SUBMUCOSAL RESECTION;  Surgeon: Geanie Logan, MD;  Location: [facility];  Service: ENT;  Laterality: Bilateral;   REPAIR RIGHT INGUINAL HERNIA W/ MESH  02-14-2006   SEPTOPLASTY N/A 09/27/2015   Procedure: SEPTOPLASTY;  Surgeon: Geanie Logan, MD;  Location: [facility];  Service: ENT;  Laterality: N/A;   URETEROSCOPY  08/12/2012   Procedure: URETEROSCOPY;  Surgeon: Sebastian Ache, MD;  Location: [facility];  Service: Urology;  Laterality: Left;  1 HR    Patient Active Problem List   Diagnosis Date Noted   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   Coronary artery disease involving native coronary artery of native heart without  angina pectoris 08/14/2023   Bipolar affective disorder in remission (HCC) 06/30/2023   Neuropathic pain of both feet 04/15/2023   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   Chronic pain syndrome 04/15/2023   Mild cognitive impairment 01/01/2023   Excessive drinking of alcohol 01/01/2023   Major depressive disorder 12/31/2022   GERD (gastroesophageal reflux disease) 12/31/2022   Macular degeneration 12/31/2022   Paresthesia of both feet 12/03/2022   Tremor 03/19/2022   Circadian rhythm sleep disorder,  delayed sleep phase type 01/21/2022   Bipolar disorder II, in full remission, most recent episode depressed 01/21/2022   High risk medication use 01/21/2022   Elevated hemoglobin A1c 08/07/2021   Rising PSA level 08/07/2021   Hyperlipidemia, mixed 12/19/2020   Benign essential hypertension 12/19/2020   Abnormal ECG 12/19/2020   Shortness of breath on exertion 12/19/2020   Nephrolithiasis 01/25/2020   Medication overdose 06/16/2017   Insomnia due to medical condition 06/16/2017   Generalized anxiety disorder 06/16/2017   Osteopenia 04/04/2014   Hyponatremia 10/08/2013   Obstructive sleep apnea 10/07/2013   Hypothyroidism 10/07/2013   Bilateral sciatica 10/07/2013   Back pain 02/24/2013   Radiculopathy 02/24/2013   History of hernia repair 05/14/2012    ONSET DATE:  03/27/2023; date of referral 07/10/2023  REFERRING DIAG: 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 Laryngoscopy revealed hypomobility of right vocal cord and complete immobility of left vocal cord.   Modified Barium Swallow Study 05/12/2023 No aspiration observed, continue regular diet with thin liquids  06/26/2023 Left hemithyroidectomy  Bedside Swallow Evaluation Recommend continue regular diet with thin liquids, head turn to pt's left to reduce s/s of aspiration at bedside  07/10/2023 Referral received from Dr Geanie Logan Laryngoscopy revealed "left cord as immobile, right  cord does move although there may be some limited abduction."    PAIN:  Are you having pain? No   FALLS: Has patient fallen in last 6 months? No,  LIVING ENVIRONMENT: Lives with: lives with their spouse Lives in: House/apartment  PLOF: Independent  PATIENT GOALS    to swallow safely and improve vocal quality  SUBJECTIVE STATEMENT: "Ever since his dental surgery, his voice has not sounded as good" Pt accompanied by: significant other  OBJECTIVE:   TODAY'S TREATMENT:  Skilled treatment session focused on pt's dysphonia. SLP facilitated session by providing the following interventions:  Pt reports 7 out of 10 effort level when completing EMST repetitions with device set at 110 cmH2O - They report that there were several days when he was not able to blow into device - uncertain as to the reason why  Conversational speech was diplophonic today decreased pt's speech intelligibility to ~ 75% at the unknown conversation level  PATIENT EDUCATION: Education details: see above Person educated: Patient and Spouse Education method: Explanation Education comprehension: needs further education   HOME EXERCISE PROGRAM: Perform the above 3 times per days 8 times each  GOALS: Goals reviewed with patient? Yes  SHORT TERM GOALS: Target date: 10 sessions Updated: 09/25/202 Updated: 10/07/2023  Patient will participate in objective swallowing evaluation (MBSS) to identify safest diet recommendation as well as therapeutic targets.  Baseline: Goal status: INITIAL: MET  2.  With Mod I, pt will demonstrate understanding of result and recommendations of MBS by verbalizing salient points.   Baseline:  Goal status: INITIAL: Goal met as pt is reliant on his wife for understanding d/t Mild Cognitive Impairment  3.  The patient will maximize voice quality and loudness using breath support/oral resonance for sustained vowel production, pitch glides, and hierarchal speech drill.   Baseline:   Goal status: INITIAL; MET- upgraded to The patient will maximize voice quality and loudness using breath support/oral resonance for sentence level utterances with Min A. - progress made: Goal MET, upgraded to The patient will maximize voice quality and loudness using breath support/oral resonance for 10 minute conversation with supervision level cues.    LONG TERM GOALS: Target date: 09/10/2023 Updated:08/20/2023 Updated: 10/07/2023 Patient will improve perception of swallowing as indicated by an improvement in EAT-10 score by 12 weeks from initial swallowing therapy session.  Baseline:  Goal status: INITIAL: Ongoing; progress made: progress made  2.  The patient will be independent for abdominal breathing and breath support exercises.   Baseline:  Goal status: INITIAL:Ongoing: progress made: Progress made  3.  The patient will demonstrate independent understanding of vocal hygiene concepts.   Baseline:  Goal status: INITIAL: Ongoing: progress made: progress made  4.  Patient will increase MEP by 10% indicating improved breath support, cough function, and respiratory strength for adequate speech function.: progress made: continued progress made increased from 90 cmH2O to 95 cm H2O   ASSESSMENT:  CLINICAL IMPRESSION: Patient is a 77 y.o. male who was seen today for behavioral voice therapy.  Pt presents with improving moderate dysphonia that is c/b hoarse, breathy, low vocal intensity.  In addition he presents with s/s concerning for pharyngeal phase dysphagia when consuming thin liquids. Modified Barium Swallow Study performed on 07/18/2023 with recommendation for thin liquids via spoon.   Pt continues to benefit from skilled ST for habituation of EMST and increased vocal intensity. See the above treatment note for details.    OBJECTIVE IMPAIRMENTS include voice disorder and dysphagia. These impairments are limiting patient from effectively communicating at home and in community and  safety when swallowing. Factors affecting potential to achieve goals and functional outcome are  pt's self-confessed pessimism . Patient will benefit from skilled SLP services to address above impairments and improve overall function.  REHAB POTENTIAL: Fair severity of impairments; pt's self-confused pessimism  PLAN: SLP FREQUENCY: 1-2x/week  SLP DURATION: 8 weeks  PLANNED INTERVENTIONS: SLP instruction and feedback and Patient/family education    Larren Copes B. Dreama Saa, M.S., CCC-SLP, CBIS Speech-Language Pathologist Certified Brain Injury Specialist [facility]  [facility] [phone] Ascom [phone] Fax [phone] '

## 2023-10-29 ENCOUNTER — Encounter: Payer: Medicare Other | Admitting: Speech Pathology

## 2023-10-30 ENCOUNTER — Encounter: Payer: Medicare Other | Admitting: Speech Pathology

## 2023-11-03 ENCOUNTER — Ambulatory Visit: Payer: Medicare Other | Admitting: Speech Pathology

## 2023-11-03 DIAGNOSIS — R490 Dysphonia: Secondary | ICD-10-CM | POA: Diagnosis not present

## 2023-11-03 DIAGNOSIS — R1313 Dysphagia, pharyngeal phase: Secondary | ICD-10-CM

## 2023-11-03 DIAGNOSIS — J3801 Paralysis of vocal cords and larynx, unilateral: Secondary | ICD-10-CM

## 2023-11-03 NOTE — Therapy (Signed)
OUTPATIENT SPEECH LANGUAGE PATHOLOGY  TREATMENT NOTE    Patient Name: Adrian Lewis MRN: [id number] DOB:22-Jun-1946, 77 y.o., male Today's Date: 11/03/2023  PCP: Dorothey Baseman, MD REFERRING PROVIDER: Geanie Logan, MD    End of Session - 11/03/23 1412     Visit Number 24    Number of Visits 29    Date for SLP Re-Evaluation 11/03/23    Authorization Type Medicare A/Medicare B    Progress Note Due on Visit 30    SLP Start Time 1400    SLP Stop Time  1445    SLP Time Calculation (min) 45 min    Activity Tolerance Patient tolerated treatment well             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   Elevated hemoglobin A1c 08/07/2021   Excessive drinking of alcohol 01/01/2023   01/01/23 - reported 4 hard ciders (4.5% ABV) nightly   Generalized anxiety disorder 06/16/2017   GERD (gastroesophageal reflux disease)    History of hernia repair 05/14/2012   History of kidney stones    Hyperlipidemia, mixed 12/19/2020   Hyponatremia 10/08/2013   Hypothyroidism    Insomnia due to medical condition 06/16/2017   Macular degeneration    Major depressive disorder    Medication overdose 06/16/2017   Mild cognitive impairment of uncertain or unknown etiology 01/01/2023   Nephrolithiasis 01/25/2020   Obstructive sleep apnea 10/07/2013   Osteopenia 04/04/2014   Paresthesia of both feet 12/03/2022   Radiculopathy 02/24/2013   Rising PSA level 08/07/2021   Shortness of breath on exertion 12/19/2020   Tremor 03/19/2022   Past Surgical History:  Procedure Laterality Date   CATARACT EXTRACTION W/ INTRAOCULAR LENS IMPLANT  2011 (APPROX)   RIGHT EYE AND REPAIR DETACHED RETINA   CYSTOSCOPY WITH RETROGRADE PYELOGRAM,  URETEROSCOPY AND STENT PLACEMENT Bilateral 02/23/2021   Procedure: CYSTOSCOPY WITH RETROGRADE PYELOGRAM, URETEROSCOPY AND STENT PLACEMENT;  Surgeon: Sebastian Ache, MD;  Location: [facility];  Service: Urology;  Laterality: Bilateral;  75 MINS   EYE SURGERY     HAMMER TOE SURGERY  2012  &  2010  (APPROX)   ONE TOE , EACH FOOT   HERNIA REPAIR Bilateral    Inguinal Hernia Repair   HOLMIUM LASER APPLICATION Bilateral 02/23/2021   Procedure: HOLMIUM LASER APPLICATION;  Surgeon: Sebastian Ache, MD;  Location: [facility];  Service: Urology;  Laterality: Bilateral;   JOINT REPLACEMENT Left 2014   Partial Knee Replacement   KNEE SURGERY Left    x 2  partial and reconstructive   NASAL TURBINATE REDUCTION Bilateral 09/27/2015   Procedure: TURBINATE REDUCTION/SUBMUCOSAL RESECTION;  Surgeon: Geanie Logan, MD;  Location: [facility];  Service: ENT;  Laterality: Bilateral;   REPAIR RIGHT INGUINAL HERNIA W/ MESH  02-14-2006   SEPTOPLASTY N/A 09/27/2015   Procedure: SEPTOPLASTY;  Surgeon: Geanie Logan, MD;  Location: [facility];  Service: ENT;  Laterality: N/A;   URETEROSCOPY  08/12/2012   Procedure: URETEROSCOPY;  Surgeon: Sebastian Ache, MD;  Location: [facility];  Service: Urology;  Laterality: Left;  1 HR    Patient Active Problem List   Diagnosis Date Noted   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   Coronary artery disease involving native coronary artery of native heart without  angina pectoris 08/14/2023   Bipolar affective disorder in remission (HCC) 06/30/2023   Neuropathic pain of both feet 04/15/2023   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   Chronic pain syndrome 04/15/2023   Mild cognitive impairment 01/01/2023   Excessive drinking of alcohol 01/01/2023   Major depressive disorder 12/31/2022   GERD (gastroesophageal reflux disease) 12/31/2022   Macular degeneration 12/31/2022   Paresthesia of both feet 12/03/2022   Tremor 03/19/2022   Circadian rhythm sleep disorder,  delayed sleep phase type 01/21/2022   Bipolar disorder II, in full remission, most recent episode depressed 01/21/2022   High risk medication use 01/21/2022   Elevated hemoglobin A1c 08/07/2021   Rising PSA level 08/07/2021   Hyperlipidemia, mixed 12/19/2020   Benign essential hypertension 12/19/2020   Abnormal ECG 12/19/2020   Shortness of breath on exertion 12/19/2020   Nephrolithiasis 01/25/2020   Medication overdose 06/16/2017   Insomnia due to medical condition 06/16/2017   Generalized anxiety disorder 06/16/2017   Osteopenia 04/04/2014   Hyponatremia 10/08/2013   Obstructive sleep apnea 10/07/2013   Hypothyroidism 10/07/2013   Bilateral sciatica 10/07/2013   Back pain 02/24/2013   Radiculopathy 02/24/2013   History of hernia repair 05/14/2012    ONSET DATE:  03/27/2023; date of referral 07/10/2023  REFERRING DIAG: 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 Laryngoscopy revealed hypomobility of right vocal cord and complete immobility of left vocal cord.   Modified Barium Swallow Study 05/12/2023 No aspiration observed, continue regular diet with thin liquids  06/26/2023 Left hemithyroidectomy  Bedside Swallow Evaluation Recommend continue regular diet with thin liquids, head turn to pt's left to reduce s/s of aspiration at bedside  07/10/2023 Referral received from Dr Geanie Logan Laryngoscopy revealed  "left cord as immobile, right cord does move although there may be some limited abduction."    PAIN:  Are you having pain? No   FALLS: Has patient fallen in last 6 months? No,  LIVING ENVIRONMENT: Lives with: lives with their spouse Lives in: House/apartment  PLOF: Independent  PATIENT GOALS    to swallow safely and improve vocal quality  SUBJECTIVE STATEMENT: "So have we don't everything we can in speech?" Pt accompanied by: significant other  OBJECTIVE:   TODAY'S TREATMENT:  Skilled treatment session focused on pt's dysphonia. SLP facilitated session by providing the following interventions:  Pt reports 7 out of 10 effort level when completing EMST repetitions with device set at 110 cmH2O - They report that there were several days when he was not able to blow into device - uncertain as to the reason why  Conversational speech continues to be dysphonic today decreased pt's speech intelligibility to ~ 75% at the unknown conversation level  PATIENT EDUCATION: Education details: see above Person educated: Patient and Spouse Education method: Explanation Education comprehension: needs further education   HOME EXERCISE PROGRAM: Perform the above 3 times per days 8 times each  GOALS: Goals reviewed with patient? Yes  SHORT TERM GOALS: Target date: 10 sessions Updated:  09/25/202 Updated: 10/07/2023 Patient will participate in objective swallowing evaluation (MBSS) to identify safest diet recommendation as well as therapeutic targets.  Baseline: Goal status: INITIAL: MET  2.  With Mod I, pt will demonstrate understanding of result and recommendations of MBS by verbalizing salient points.   Baseline:  Goal status: INITIAL: Goal met as pt is reliant on his wife for understanding d/t Mild Cognitive Impairment  3.  The patient will maximize voice quality and loudness using breath support/oral resonance for sustained vowel production, pitch glides, and hierarchal speech  drill.   Baseline:  Goal status: INITIAL; MET- upgraded to The patient will maximize voice quality and loudness using breath support/oral resonance for sentence level utterances with Min A. - progress made: Goal MET, upgraded to The patient will maximize voice quality and loudness using breath support/oral resonance for 10 minute conversation with supervision level cues.    LONG TERM GOALS: Target date: 09/10/2023 Updated:08/20/2023 Updated: 10/07/2023 Patient will improve perception of swallowing as indicated by an improvement in EAT-10 score by 12 weeks from initial swallowing therapy session.  Baseline:  Goal status: INITIAL: Ongoing; progress made: progress made  2.  The patient will be independent for abdominal breathing and breath support exercises.   Baseline:  Goal status: INITIAL:Ongoing: progress made: Progress made  3.  The patient will demonstrate independent understanding of vocal hygiene concepts.   Baseline:  Goal status: INITIAL: Ongoing: progress made: progress made  4.  Patient will increase MEP by 10% indicating improved breath support, cough function, and respiratory strength for adequate speech function.: progress made: continued progress made increased from 90 cmH2O to 95 cm H2O   ASSESSMENT:  CLINICAL IMPRESSION: Patient is a 77 y.o. male who was seen today for behavioral voice therapy.  Pt presents with improving moderate dysphonia that is c/b hoarse, breathy, low vocal intensity.  In addition he presents with s/s concerning for pharyngeal phase dysphagia when consuming thin liquids. Modified Barium Swallow Study performed on 07/18/2023 with recommendation for thin liquids via spoon.   Pt's ability has been stable over the last several weeks, therefore recommend placing pt on hold pending appt with otolaryngologist at [facility] on January 6th. See the above treatment note for details.    OBJECTIVE IMPAIRMENTS include voice disorder and dysphagia. These impairments  are limiting patient from effectively communicating at home and in community and safety when swallowing. Factors affecting potential to achieve goals and functional outcome are  pt's self-confessed pessimism . Patient will benefit from skilled SLP services to address above impairments and improve overall function.  REHAB POTENTIAL: Fair severity of impairments; pt's self-confused pessimism  PLAN: Plan to put pt on hold pending appt with otolaryngologist at [facility] B. Dreama Saa, M.S., CCC-SLP, CBIS Speech-Language Pathologist Certified Brain Injury Specialist [facility]  [facility] [phone] Ascom [phone] Fax [phone] '

## 2023-11-11 ENCOUNTER — Ambulatory Visit: Payer: Medicare Other | Admitting: Speech Pathology

## 2023-11-11 ENCOUNTER — Telehealth: Payer: Self-pay

## 2023-11-11 DIAGNOSIS — Z79899 Other long term (current) drug therapy: Secondary | ICD-10-CM

## 2023-11-11 DIAGNOSIS — F3181 Bipolar II disorder: Secondary | ICD-10-CM

## 2023-11-11 NOTE — Telephone Encounter (Signed)
Victorino Dike the patients spouse called stating that patient went to [facility] to have his lithium level blood work and there was no standing order and could not have it done. He is  requesting that this be sent over as soon as possible please advise

## 2023-11-11 NOTE — Telephone Encounter (Signed)
I have placed this time in order for lithium level, BUN/creatinine as well as thyroid test as needed.  I have also printed out a lab slip for lithium level TSH and BUN creatinine and provided to the nurse to fax over to [facility] if patient still has issues.

## 2023-11-11 NOTE — Telephone Encounter (Signed)
Called patient he requested that the lab order be faxed to the [facility] on [address] order was faxed called patient to make aware that I did fax it and received the confirmation

## 2023-11-12 LAB — BUN+CREAT
BUN/Creatinine Ratio: 17 (ref 10–24)
BUN: 16 mg/dL (ref 8–27)
Creatinine, Ser: 0.95 mg/dL (ref 0.76–1.27)
eGFR: 82 mL/min/{1.73_m2} (ref >59)

## 2023-11-12 LAB — LITHIUM LEVEL: Lithium Lvl: 0.4 mmol/L — ABNORMAL LOW (ref 0.5–1.2)

## 2023-11-12 LAB — TSH: TSH: 1.8 u[IU]/mL (ref 0.450–4.500)

## 2023-11-21 ENCOUNTER — Ambulatory Visit (INDEPENDENT_AMBULATORY_CARE_PROVIDER_SITE_OTHER): Payer: Medicare Other

## 2023-11-21 DIAGNOSIS — M79676 Pain in unspecified toe(s): Secondary | ICD-10-CM

## 2023-11-21 DIAGNOSIS — M778 Other enthesopathies, not elsewhere classified: Secondary | ICD-10-CM | POA: Diagnosis not present

## 2023-11-21 DIAGNOSIS — M19079 Primary osteoarthritis, unspecified ankle and foot: Secondary | ICD-10-CM | POA: Diagnosis not present

## 2023-11-21 DIAGNOSIS — B351 Tinea unguium: Secondary | ICD-10-CM

## 2023-11-21 DIAGNOSIS — M2141 Flat foot [pes planus] (acquired), right foot: Secondary | ICD-10-CM | POA: Diagnosis not present

## 2023-11-21 DIAGNOSIS — M19072 Primary osteoarthritis, left ankle and foot: Secondary | ICD-10-CM

## 2023-11-21 DIAGNOSIS — M2142 Flat foot [pes planus] (acquired), left foot: Secondary | ICD-10-CM

## 2023-11-25 ENCOUNTER — Encounter: Payer: Medicare Other | Admitting: Speech Pathology

## 2023-12-01 NOTE — Progress Notes (Signed)
 [facility] SPEECH [address] OUTPATIENT SPEECH PATHOLOGY 12/01/2023   Patient Name: Adrian Lewis Date of Birth:1946-05-25 Session Number: 1 Diagnosis:  Encounter Diagnosis  Name Primary?  SABRA Dysphonia Yes     Date of Evaluation: 12/01/23   Referred by: Genell Fairly, MD  Reason for Referral: Evaluation Speech, Language, Voice, Cognition    ASSESSMENT:  Patient presents with moderate-severe dysphonia.  Characterized by: low pitch, hard glottal attacks, hoarseness, breathiness, phonation breaks, pharyngeal focus, strained voice, roughness Therapy Techniques utilized during trial/diagnostic therapy:: Increased pitch, Increased airflow, Yawn-sigh, Resonant voice therapy, Lip/tongue trills, Pitch glides up and down    Stimulability: Pt was somewhat stimulable   Treatment Recommendations: Initiate Treatment        PLAN:  SLP Follow-up / Frequency: 1x week for Planned Treatment Duration : 1 session per week-every other week x9 over 3 months  Planned Interventions: Respiratory muscle training, Resonant Voice Therapy  Recommended Interventions: Semi-occluded Vocal Tract Exercises, Financial Controller, Functional Phonation Tasks  Prognosis:  Excellent   Negative Prognosis Rationale: Time post onset     Positive Prognosis Rationale: Age, Motivation, Good caregiver/family support    Goals:  Patient and Family Goals: Improve vocal function   STG 1: Breathing: Pt will demonstrate abdominal breathing for support of phonation with 80% accuracy given minimal cues   Time Frame: 3  Duration: months   STG 2: Resonance: Pt will balance oral/nasal resonance to maximize vocal output with minimal effort/laryngeal hyperfunction with 80% accuracy given minimal cues                STG 3: Strength/balance: Pt will use balance of vocal fold adduction and airflow to produce voice in structured exercises and spontaneous speech tasks with 80% accuracy given minimal cues               STG 4:  Inflection: Pt will vary pitch and/or loudness to demonstrate phonatory flexibility and laryngeal control without laryngeal hyperfunction in sustained phonation exercises and conversational speech with 80% accuracy given minimal cues      LTG #1: At the completion of Tx, Pt will demonstrate ability to perform voice therapy techniques independently to maintain optimal laryngeal function (+/-)       Time Frame: 3  Duration: months         SUBJECTIVE:  Adrian Lewis is a 78 y.o. male who has a past medical history of Depression, Hearing loss, Hypertension, Sleep apnea, and Thyroid disease. 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 Last spring noted to have weaker/rough/breathy voice compared to baseline. 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 Underwent left hemithyroidectomy with Dr. Denys in 06/2023. Operative findings included Left recurrent laryngeal and superior laryngeal nerve identified and preserved. Left recurrent laryngeal nerve did not stimulate well proximally, but stimulated at the cricothyroid joint. Immediately after surgery voice was weaker/breathier, however has since improved. Has been doing voice therapy near [place], but was graduated around one month ago. Still feels as though voice is rough and weak, similar to pre-op baseline. No shortness of breath.    Pt reports having had 10-12 voice sessions. Pt worked with  breather device and SOVTE's. Found some benefit to therapy, but reports voice is still hoarse/raspy.     Pt  Endorses: Hoarse/rough/scratchy voice Increased effort to produce voice Decreased loudness Shaky voice Vocal fatigue Phonation breaks Decreased pitch range Voice loss - intermittent or complete   Exacerbating factors: speaking softly Ameliorating factors: Warming voice up    Communication Preference: Written, Verbal, Visual        Barriers to Learning: No Barriers   Hearing Exceptions: No hearing aid, Hard of hearing/hearing concerns                                             Prior treatment for referral reason: Yes   Comments: 10-12 Session in hometown             Pain?: No     Precautions: Aspiration        Prior Function: Independent                                   Past Medical History:  Diagnosis Date  . Depression   . Hearing loss   . Hypertension   . Sleep apnea   . Thyroid disease     Family History  Problem Relation Age of Onset  . Hypertension Other    Past Surgical History:  Procedure Laterality Date  . NASAL SEPTUM SURGERY  11/09/2019  . PR THYROIDECTOMY=SUBSTERNAL,TRANSCERV Left 06/26/2023   Procedure: THYROIDECTOMY, INCLUDING SUBSTERNAL THYROID; CERVICAL APPROACH;  Surgeon: Denys Reyes Lunger, MD;  Location: MAIN OR [facility];  Service: ENT    Allergies  Allergen Reactions  . Venom-Honey Bee Anaphylaxis    Has EPI pen for this  . Cat Dander Other (See Comments)    Per pt his nose runs/congestion.  . Clonidine Headache and Other (See Comments)    fatigue  fatigue   Social History   Tobacco Use  . Smoking status: Never  . Smokeless tobacco: Never  Substance Use Topics  . Alcohol use: Yes    Alcohol/week: 2.0 standard drinks of alcohol    Types: 2 Glasses of wine per week    Current Outpatient Medications  Medication Sig Dispense Refill  . acetaminophen  (TYLENOL  EXTRA STRENGTH) 500 MG tablet Take 2 tablets (1,000 mg total) by mouth every six (6) hours as needed for pain.    SABRA ascorbic acid, vitamin C, (VITAMIN C) 1000 MG tablet Take 1  tablet (1,000 mg total) by mouth.    SABRA atorvastatin (LIPITOR) 20 MG tablet Take 1 tablet (20 mg total) by mouth daily.    . buPROPion  (WELLBUTRIN  SR) 200 MG 12 hr tablet Take 1 tablet (200 mg total) by mouth daily.    . EPINEPHrine  (EPIPEN ) 0.3 mg/0.3 mL injection This is for Allergy shots and Bee stings.    . ergocalciferol -1,250 mcg, 50,000 unit, (DRISDOL) 1,250 mcg (50,000 unit) capsule TAKE 1 CAPSULE BY MOUTH 2 TIMES A WEEK    . ferrous fumarate-b12-vitamic C-folic acid (FOLTRIN) 110-0.5 mg capsule Take 1 capsule by mouth daily before breakfast.    . lamoTRIgine  (LAMICTAL ) 100 MG tablet Take 1 tablet (100 mg total) by mouth daily.    SABRA levothyroxine (SYNTHROID) 25 MCG tablet TAKE 1 TABLET BY MOUTH DAILY ON AN EMPTY STOMACH WITH A GLASS OF WATER AT LAST 30 TO 60 MINUTES  BEFORE BREAKFAST    . lithium  (LITHOBID ) 450 MG ER tablet Take 0.5 tablets (225 mg total) by mouth two (2) times a day.    . magnesium oxide (MAG-OX) 400 mg (241.3 mg elemental magnesium) tablet Take by mouth.    . polyethylene glycol (MIRALAX) 17 gram packet Take 17 g by mouth daily as needed.    . potassium citrate (UROCIT-K) 10 mEq SR tablet 1 tablet with meals    . pregabalin (LYRICA) 50 MG capsule Take 1 capsule (50 mg total) by mouth two (2) times a day for 7 days. 14 capsule 0  . quercetin 500 mg cap Take 1 capsule by mouth daily.    . resveratrol 250 mg cap Take 250 mg by mouth.    SABRA s-adenosylmethionine 400 mg Tab Take 400 mg by mouth daily.    SABRA senna-docusate (PERICOLACE) 8.6-50 mg Take 1 tablet by mouth.    . telmisartan (MICARDIS) 20 MG tablet Take 1 tablet (20 mg total) by mouth daily.    SABRA ubidecarenone (COENZYME Q10) 60 mg cap Take 60 mg by mouth.    . zaleplon  (SONATA ) 10 MG capsule Take 1 capsule (10 mg total) by mouth nightly.     Current Facility-Administered Medications  Medication Dose Route Frequency Provider Last Rate Last Admin  . lidocaine -EPINEPHrine  (XYLOCAINE  W/EPI) 1 %-1:100,000 injection 1 mL   1 mL Infiltration Once        Voice- Related Quality of Life (V-RQOL) Measure was administered and interpreted today.  This is a patient questionaire that consists of 10 voice-related statements that the patient rates from a severity of 1-5.  Severity ratings are as follows:  10-excellent, 20-fair to good, 30-poor to fair, 40-poor, 50-worst possible.  This patient scored as follows:    I have trouble speaking loudly or being heard in noisy situations: 4 I run out of air and need to take frequent breaths when talking: 2 I sometimes do not know what will come out when I begin speaking: 1 I am sometimes anxious or frustrated because of my voice: 4 I sometimes get depressed because of my voice: 4 I have trouble using the telephone because of my voice: 4 I have trouble doing my job or practicing my profession because of my voice: 3 I avoid going out socially because of my voice: 4 I have to repeat myself to be understood: 5 I have become less outgoing because of my voice: 4 VRQOL Raw Score: 35 Calculated Score: 37.5  The Glottal Function Index (GFI) was administered today.  This is a four question patient questionnaire in which the patient rates how his/her voice production has impacted every day activities within the past month.  A score of 0 would indicate no problem and a score of 5 indicates a severe problem:  Speaking took extra effort: 4 Throat discomfort or pain after using your voice: 2 Vocal fatigue (voice weakened as you talked): 4 Voice cracks or sounds different: 4 Glottal Function Index Total: 14     OBJECTIVE Voice - General  Hydration: 20+ ginger ale, pelligrino, Caffeine Use: 2 cups tea, 1 cup coffee Onset: Sudden Daily Voice Use: Moderate demand  Acoustic Measures Connected Sample: CAPE-V Sentences Connected Sample 1 (Hz): 161.93 Hz  Total Fundamental Frequency Total Fundamental Frequency Range (Hz): 88 Hz to (Hz): 424 Hz  Sustained Phonation Mean Fund Freq  (Hz): 152.99 Hz Relative Average Perturbation (RAP) %: 1.8 % Noise-to-Harmonic Ratio (NHR): 0.27 Degree of Subharmonics (DSH) %: 35.59 % Degree  of Voiceless (DUV) %: 0 % Soft Phonation Index (SPI) : 17.38 Pt has decrease in following acoustic measures:: Total Fo range  Aerodynamic Measures Maximum phonation time on /a/ in sec:: 14 sec Mean Flow Rate (mL/sec): 110 mL/sec at (Hz): 170.6 Hz and (dB): 93.26 dB     Perceptual Measures Grade:: 2 Rough:: 2 Breathy:: 2 Asthenic:: 2 Strained:: 2 Total:: 10 /15  Tremor / Rate of Speech Tremor:: Absent Rate of Speech:: WFL     Session Duration : 30  Today's Charges (noted here with $$):   SLP Evaluation Charges $$ Voice Quality Evaluation [mins]: 15 $$ Laryngeal Function Study - Voice Ctr only [mins]: 15          I attest that I have reviewed the above information. Signed: Lillian DELENA Parkins, SLP 12/01/2023 11:43 AM

## 2023-12-01 NOTE — Progress Notes (Signed)
 Otolaryngology New Patient Consult Visit  Reason for visit:  [name] is seen in consultation at the request of Adrian Reyes Prom* for the evaluation of left vocal fold paralysis  History of Present Illness:  Mr.Adrian Lewis is a 78 y.o. year old male who presents with a 9 months history of dysphonia. Last spring noted to have weaker/rough/breathy voice compared to baseline. 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 Underwent left hemithyroidectomy with Dr. Denys in 06/2023. Operative findings included Left recurrent laryngeal and superior laryngeal nerve identified and preserved. Left recurrent laryngeal nerve did not stimulate well proximally, but stimulated at the cricothyroid joint. Immediately after surgery voice was weaker/breathier, however has since improved. Has been doing voice therapy near [place], but was graduated around one month ago. Still feels as though voice is rough and weak, similar to pre-op baseline. No shortness of breath. Uses voice socially, retired professor.   Has over the past year had coughing with liquid PO, no issue with solids. Post-operative MBSS with vestibular penetration and aspiration visualized with thin and nectar consistencies of liquid. No overt aspiration events, no pneumonias. Coughing 1-2 times/day with thins, no issue with drinking/eating slow, using SLP techniques.   Past Medical History: Past Medical History:  Diagnosis Date  . Depression   . Hearing loss   . Hypertension   . Sleep apnea   . Thyroid disease     Past Surgical History: Past Surgical History:  Procedure Laterality Date  . NASAL SEPTUM SURGERY  11/09/2019  . PR THYROIDECTOMY=SUBSTERNAL,TRANSCERV Left 06/26/2023   Procedure: THYROIDECTOMY, INCLUDING SUBSTERNAL THYROID; CERVICAL APPROACH;  Surgeon: Adrian Reyes Lunger, Adrian Lewis;   Location: MAIN OR [facility];  Service: ENT    Medications:  Current Outpatient Medications:  .  acetaminophen  (TYLENOL  EXTRA STRENGTH) 500 MG tablet, Take 2 tablets (1,000 mg total) by mouth every six (6) hours as needed for pain., Disp: , Rfl:  .  ascorbic acid, vitamin C, (VITAMIN C) 1000 MG tablet, Take 1 tablet (1,000 mg total) by mouth., Disp: , Rfl:  .  atorvastatin (LIPITOR) 20 MG tablet, Take 1 tablet (20 mg total) by mouth daily., Disp: , Rfl:  .  buPROPion  (WELLBUTRIN  SR) 200 MG 12 hr tablet, Take 1 tablet (200 mg total) by mouth daily., Disp: , Rfl:  .  EPINEPHrine  (EPIPEN ) 0.3 mg/0.3 mL injection, This is for Allergy shots and Bee stings., Disp: , Rfl:  .  ergocalciferol -1,250 mcg, 50,000 unit, (DRISDOL) 1,250 mcg (50,000 unit) capsule, TAKE 1 CAPSULE BY MOUTH 2 TIMES A WEEK, Disp: , Rfl:  .  ferrous fumarate-b12-vitamic C-folic acid (FOLTRIN) 110-0.5 mg capsule, Take 1 capsule by mouth daily before breakfast., Disp: , Rfl:  .  lamoTRIgine  (LAMICTAL ) 100 MG tablet, Take 1 tablet (100 mg total) by mouth daily., Disp: , Rfl:  .  levothyroxine (SYNTHROID) 25 MCG tablet, TAKE 1 TABLET BY MOUTH DAILY ON AN EMPTY STOMACH WITH A GLASS OF WATER AT LAST 30 TO 60 MINUTES BEFORE BREAKFAST, Disp: , Rfl:  .  lithium  (LITHOBID ) 450 MG ER tablet, Take 0.5 tablets (225 mg total) by mouth two (2) times a day., Disp: , Rfl:  .  magnesium oxide (MAG-OX) 400 mg (241.3 mg elemental magnesium) tablet, Take by mouth., Disp: , Rfl:  .  polyethylene glycol (MIRALAX) 17 gram packet, Take 17 g by mouth daily as needed., Disp: , Rfl:  .  potassium citrate (UROCIT-K)  10 mEq SR tablet, 1 tablet with meals, Disp: , Rfl:  .  pregabalin (LYRICA) 50 MG capsule, Take 1 capsule (50 mg total) by mouth two (2) times a day for 7 days., Disp: 14 capsule, Rfl: 0 .  quercetin 500 mg cap, Take 1 capsule by mouth daily., Disp: , Rfl:  .  resveratrol 250 mg cap, Take 250 mg by mouth., Disp: , Rfl:  .  s-adenosylmethionine 400 mg  Tab, Take 400 mg by mouth daily., Disp: , Rfl:  .  senna-docusate (PERICOLACE) 8.6-50 mg, Take 1 tablet by mouth., Disp: , Rfl:  .  telmisartan (MICARDIS) 20 MG tablet, Take 1 tablet (20 mg total) by mouth daily., Disp: , Rfl:  .  ubidecarenone (COENZYME Q10) 60 mg cap, Take 60 mg by mouth., Disp: , Rfl:  .  zaleplon  (SONATA ) 10 MG capsule, Take 1 capsule (10 mg total) by mouth nightly., Disp: , Rfl:   Current Facility-Administered Medications:  .  lidocaine -EPINEPHrine  (XYLOCAINE  W/EPI) 1 %-1:100,000 injection 1 mL, 1 mL, Infiltration, Once,    Allergies: Allergies  Allergen Reactions  . Venom-Honey Bee Anaphylaxis    Has EPI pen for this  . Cat Dander Other (See Comments)    Per pt his nose runs/congestion.  . Clonidine Headache and Other (See Comments)    fatigue  fatigue    Family History: Family History  Problem Relation Age of Onset  . Hypertension Other     Social History: Social History   Tobacco Use  . Smoking status: Never  . Smokeless tobacco: Never  Vaping Use  . Vaping status: Never Used  Substance Use Topics  . Alcohol use: Yes    Alcohol/week: 2.0 standard drinks of alcohol    Types: 2 Glasses of wine per week  . Drug use: Never    Review of Systems: The balance of 11 systems were reviewed and are negative except as noted in HPI or on intake form.   Physical Exam:  Constitutional:  Vitals reviewed in chart, patient has normal appearance. Well nourished, well-developed, no acute distress Voice:  Rough, intermittent breaks, mild aesthenic Respiration:  Breathing comfortably, no stridor. CV: No clubbing/cyanosis/edema in hands.  Eyes:  extraocular motion intact, sclera normal.  Neuro:  Alert and oriented times 3, Cranial nerves 2-12 intact and symmetric bilaterally.  Head and Face:  Skin with no masses or lesions, sinuses nontender to palpation, facial nerve fully intact.  Salivary Glands:  Parotid and submandibular glands normal bilaterally.  Ears:   Normal tympanic membranes to otoscopy, normal hearing to whispered voice.  Nose:  External nose midline, anterior rhinoscopy is normal with limited visualization just to the anterior interior turbinate.  Oral Cavity/Oropharynx/Lips:  Normal mucous membranes, normal floor of mouth/tongue/oropharynx, no masses or lesions are noted.  Good dentition Pharyngeal Walls:  No masses noted. Neck/Lymph:  No lymphadenopathy, no thyroid masses. Well healed transverse neck incision Larynx: Mirror evaluation insufficient to visualize secondary to prominent gag   Procedure Note  Endoscopy Type:  Flexible Fiberoptic Videostroboscopy  Indications/TimeOut:  To better evaluate the patient's symptoms, fiberoptic videostroboscopy is indicated.   A time out identifying the patient, the procedure, the location of the procedure and any concerns was performed prior to beginning the procedure.  Procedure Details:   The patient was placed in the sitting position.  After topical anesthesia and decongestion with oxymetazoline  and 1% lidocaine , the flexible laryngoscope was passed.  The microphone was used to trigger the xenon stroboscopic light source.  -  Nasal cavity  - There was no pus or polyps noted in the nasal cavity. Septum intact -  Nasopharynx/Oropharynx - there was no evidence of mass or lesion. -  Hypopharynx - There were no lesions in the pyriformis, pharyngeal walls, or postcricoid space -  Larynx -  there was mild interarytenoid edema, no erythema. No lesions of the epiglottis, false folds, or aryepiglottic folds -  Vocal Folds -    Mobility: Right VF hypomobility and Left VF immobility  Amplitude: Symmetric bilaterally  Mucosal Wave: Mucosal wave intact bilaterally  Closure: Complete  Lesions/Other findings: bilateral vocal fold atrophy  - Subglottis - Normal  Condition: Stable.  Patient tolerated procedure well.  Complications: None  Voice- Related Quality of Life (V-RQOL) Measure was  administered and interpreted today.  This is a patient questionaire that consists of 10 voice-related statements that the patient rates from a severity of 1-5.  Severity ratings are as follows:  10-excellent, 20-fair to good, 30-poor to fair, 40-poor, 50-worst possible.  This patient scored as follows:     I have trouble speaking loudly or being heard in noisy situations: 4 I run out of air and need to take frequent breaths when talking: 2 I sometimes do not know what will come out when I begin speaking: 1 I am sometimes anxious or frustrated because of my voice: 4 I sometimes get depressed because of my voice: 4 I have trouble using the telephone because of my voice: 4 I have trouble doing my job or practicing my profession because of my voice: 3 I avoid going out socially because of my voice: 4 I have to repeat myself to be understood: 5 I have become less outgoing because of my voice: 4 VRQOL Raw Score: 35 Calculated Score: 37.5   The Glottal Function Index (GFI) was administered today.  This is a four question patient questionnaire in which the patient rates how his/her voice production has impacted every day activities within the past month.  A score of 0 would indicate no problem and a score of 5 indicates a severe problem:   Speaking took extra effort: 4 Throat discomfort or pain after using your voice: 2 Vocal fatigue (voice weakened as you talked): 4 Voice cracks or sounds different: 4 Glottal Function Index Total: 14      Imaging/tests results: CT Neck and Chest 04/08/23 personally reviewed  Voice Evaluation:   A Speech Language Pathology evaluation was ordered and deemed necessary and standard of care.   Assessment:  Mr.Elva DESMAN POLAK is a 78 y.o. 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 While he has some weakness in the voice, any attempt at medialization my risk adequate airway. As he has good glottic closure today, rec behavioral voice therapy. Will have him evaluated here.  Patient was evaluated by speech language pathology today, after discussion of her case and voice analysis with the provider, he was deemed stimulabe for improvement with behavioral voice therapy and will initia   Plan: - Recommended continued voice therapy, evaluated today with plan for continued treatment here at [facility] - do not recommend medialization at this juncture - Follow up in 4-6 months  Follow-up: Return in about 4 months (around 03/30/2024) for In-person 4-6 months.

## 2023-12-19 ENCOUNTER — Telehealth: Payer: Self-pay

## 2023-12-19 NOTE — Telephone Encounter (Signed)
Orthos in [place]

## 2023-12-23 ENCOUNTER — Ambulatory Visit: Payer: Medicare Other | Admitting: Psychiatry

## 2023-12-23 ENCOUNTER — Encounter: Payer: Self-pay | Admitting: Psychiatry

## 2023-12-23 VITALS — BP 128/82 | HR 91 | Temp 98.4°F | Ht 67.0 in | Wt 171.0 lb

## 2023-12-23 DIAGNOSIS — Z79899 Other long term (current) drug therapy: Secondary | ICD-10-CM | POA: Diagnosis not present

## 2023-12-23 DIAGNOSIS — F09 Unspecified mental disorder due to known physiological condition: Secondary | ICD-10-CM

## 2023-12-23 DIAGNOSIS — F3176 Bipolar disorder, in full remission, most recent episode depressed: Secondary | ICD-10-CM | POA: Diagnosis not present

## 2023-12-23 DIAGNOSIS — G4701 Insomnia due to medical condition: Secondary | ICD-10-CM

## 2023-12-23 MED ORDER — BUPROPION HCL ER (SR) 200 MG PO TB12: 200.0000 mg | ORAL_TABLET | Freq: Every morning | ORAL | 1 refills | Status: DC

## 2023-12-23 MED ORDER — LAMOTRIGINE 100 MG PO TABS: 50.0000 mg | ORAL_TABLET | Freq: Every day | ORAL | Status: DC

## 2023-12-23 MED ORDER — ZALEPLON 10 MG PO CAPS: 10.0000 mg | ORAL_CAPSULE | Freq: Every evening | ORAL | 3 refills | Status: DC | PRN

## 2023-12-23 MED ORDER — LITHIUM CARBONATE ER 450 MG PO TBCR: 900.0000 mg | EXTENDED_RELEASE_TABLET | Freq: Every day | ORAL | 1 refills | Status: DC

## 2023-12-23 NOTE — Progress Notes (Unsigned)
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  The patient is currently taking lithium carbonate at a dose of 900 mg daily, split between breakfast and lunch. He is concerned about his lithium level, which was recently measured at 0.6, and is considering substituting lithium orotate, although he acknowledges it is not prescribed pharmaceutically. He emphasizes the importance of maintaining effective lithium levels to prevent depression. He is also on Lamictal (lamotrigine) at 100 mg and Wellbutrin which she is compliant on.  He does not know if the Lamictal is causing any significant effects.  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 He uses various devices such as a foot massagerand TENS therapy, along with vitamin B supplements and alpha-lipoic acid, but none have provided substantial relief. 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  He describes voice issues following the removal of a goiter in the summer, which resulted in damage to a nerve affecting his vocal cords. He has started specialized voice therapy after a consultation in [facility], where surgical intervention was deemed not possible. He notes his voice 'cracks' and lacks projection, making phone conversations challenging.  Regarding sleep, he uses a CPAP  machine and reports generally good sleep quality, though he experiences dry mouth and needs to refill the CPAP water tank during the night. He has been using lorazepam (from a previous prescription), cut into quarters from a 1 mg tablet, for sleep, but acknowledges its potential risks, especially when combined with alcohol. He also mentions using Sonata (Zaleplon) but finds it less effective since he has difficulty falling asleep.  He has reduced alcohol consumption to one glass of cider nightly, adhering to recommended limits.  Patient currently denies any suicidality, homicidality or perceptual disturbances.  Collateral information was obtained from wife Adrian Lewis who was present in session who reports patient overall is doing fairly well on the current medication regimen for his mood symptoms.  He has been getting at least 6 to 7 hours of solid sleep at night, goes to bed at around 10 - 10:30 PM and wakes up by 7- 7:30 AM.  Visit Diagnosis:    ICD-10-CM   1. Bipolar disorder, in full remission, most recent episode depressed (HCC)  F31.76 buPROPion (WELLBUTRIN SR) 200 MG 12 hr tablet    2. Insomnia due to medical condition  G47.01 zaleplon (SONATA) 10 MG capsule   OSA with CPAP, mood disorder    3. Mild cognitive disorder  F09 lamoTRIgine (LAMICTAL) 100 MG tablet    4. High risk medication use  Z79.899 lithium carbonate (ESKALITH) 450 MG ER tablet      Past Psychiatric History: I have reviewed past psychiatric history from progress note on 01/21/2022.  Past trials of medications like Lunesta, Sonata, Belsomra-cost, Wellbutrin XL-did not tolerate higher dosage of 300 mg.  Past Medical History:  Past Medical History:  Diagnosis Date   Abnormal ECG  12/19/2020   Arthritis    Benign essential hypertension 12/19/2020   Bilateral sciatica 10/07/2013   Bipolar disorder II, in full remission, most recent episode depressed 01/21/2022   Circadian rhythm sleep disorder, delayed sleep phase type  01/21/2022   Elevated hemoglobin A1c 08/07/2021   Excessive drinking of alcohol 01/01/2023   01/01/23 - reported 4 hard ciders (4.5% ABV) nightly   Generalized anxiety disorder 06/16/2017   GERD (gastroesophageal reflux disease)    History of hernia repair 05/14/2012   History of kidney stones    Hyperlipidemia, mixed 12/19/2020   Hyponatremia 10/08/2013   Hypothyroidism    Insomnia due to medical condition 06/16/2017   Macular degeneration    Major depressive disorder    Medication overdose 06/16/2017   Mild cognitive impairment of uncertain or unknown etiology 01/01/2023   Nephrolithiasis 01/25/2020   Obstructive sleep apnea 10/07/2013   Osteopenia 04/04/2014   Paresthesia of both feet 12/03/2022   Radiculopathy 02/24/2013   Rising PSA level 08/07/2021   Shortness of breath on exertion 12/19/2020   Tremor 03/19/2022    Past Surgical History:  Procedure Laterality Date   CATARACT EXTRACTION W/ INTRAOCULAR LENS IMPLANT  2011 (APPROX)   RIGHT EYE AND REPAIR DETACHED RETINA   CYSTOSCOPY WITH RETROGRADE PYELOGRAM, URETEROSCOPY AND STENT PLACEMENT Bilateral 02/23/2021   Procedure: CYSTOSCOPY WITH RETROGRADE PYELOGRAM, URETEROSCOPY AND STENT PLACEMENT;  Surgeon: Sebastian Ache, MD;  Location: [facility];  Service: Urology;  Laterality: Bilateral;  75 MINS   EYE SURGERY     HAMMER TOE SURGERY  2012  &  2010  (APPROX)   ONE TOE , EACH FOOT   HERNIA REPAIR Bilateral    Inguinal Hernia Repair   HOLMIUM LASER APPLICATION Bilateral 02/23/2021   Procedure: HOLMIUM LASER APPLICATION;  Surgeon: Sebastian Ache, MD;  Location: [facility];  Service: Urology;  Laterality: Bilateral;   JOINT REPLACEMENT Left 2014   Partial Knee Replacement   KNEE SURGERY Left    x 2  partial and reconstructive   NASAL TURBINATE REDUCTION Bilateral 09/27/2015   Procedure: TURBINATE REDUCTION/SUBMUCOSAL RESECTION;  Surgeon: Geanie Logan, MD;  Location: [facility];  Service: ENT;  Laterality: Bilateral;   REPAIR RIGHT  INGUINAL HERNIA W/ MESH  02-14-2006   SEPTOPLASTY N/A 09/27/2015   Procedure: SEPTOPLASTY;  Surgeon: Geanie Logan, MD;  Location: [facility];  Service: ENT;  Laterality: N/A;   URETEROSCOPY  08/12/2012   Procedure: URETEROSCOPY;  Surgeon: Sebastian Ache, MD;  Location: [facility];  Service: Urology;  Laterality: Left;  1 HR     Family Psychiatric History: I have reviewed family psychiatric history from progress note on 01/21/2022.  Family History:  Family History  Problem Relation Age of Onset   Dementia Mother        with advanced age; ~3s   Heart attack Father    Suicidality Sister    Suicidality Brother    Parkinson's disease Maternal Aunt     Social History: I have reviewed social history from progress note on 01/21/2022. Social History   Socioeconomic History   Marital status: Married    Spouse name: Not on file   Number of children: 2   Years of education: 20   Highest education level: Doctorate  Occupational History   Occupation: Retired    Comment: English professor  Tobacco Use   Smoking status: Never   Smokeless tobacco: Never  Vaping Use   Vaping status: Never Used  Substance and Sexual Activity   Alcohol use: Yes  Alcohol/week: 28.0 standard drinks of alcohol    Types: 28 Standard drinks or equivalent per week    Comment: 4 hard ciders nightly   Drug use: No   Sexual activity: Not Currently  Other Topics Concern   Not on file  Social History Narrative   Right handed   Drinks caffeine   Two story home   Lives with wife in the home   retired   Social Drivers of Health   Financial Resource Strain: Patient Declined (12/11/2023)   Received from [facility]   Overall Financial Resource Strain (CARDIA)    Difficulty of Paying Living Expenses: Patient declined  Food Insecurity: Patient Declined (12/11/2023)   [facility]   Hunger Vital Sign    Worried About Running Out of Food in the Last  Year: Patient declined    Ran Out of Food in the Last Year: Patient declined  Transportation Needs: Patient Declined (12/11/2023)   Received from [facility] - [facility]    In the past 12 months, has lack of transportation kept you from medical appointments or from getting medications?: Patient declined    Lack of Transportation (Non-Medical): Patient declined  Physical Activity: Not on file  Stress: Not on file  Social Connections: Not on file    Allergies:  Allergies  Allergen Reactions   Bee Venom Anaphylaxis    Has EPI pen for this   Cat Dander Other (See Comments)    Per pt his nose runs/congestion.    Clonidine Other (See Comments)    fatigue fatigue fatigue   Grass Pollen(K-O-R-T-Swt Vern) Other (See Comments)    Runny nose   Other Other (See Comments)    Per pt his nose runs/congestion.    Metabolic Disorder Labs: No results found for: "HGBA1C", "MPG" No results found for: "PROLACTIN" No results found for: "CHOL", "TRIG", "HDL", "CHOLHDL", "VLDL", "LDLCALC" Lab Results  Component Value Date   TSH 1.800 11/11/2023   TSH 1.310 11/06/2022    Therapeutic Level Labs: Lab Results  Component Value Date   LITHIUM 0.4 (L) 11/11/2023   LITHIUM 0.4 (L) 10/20/2023   No results found for: "VALPROATE" No results found for: "CBMZ"  Current Medications: Current Outpatient Medications  Medication Sig Dispense Refill   acetaminophen (TYLENOL) 500 MG tablet Take by mouth.     Ascorbic Acid (VITAMIN C) 1000 MG tablet Take 1,000 mg by mouth daily.     b complex vitamins capsule Take 1 capsule by mouth daily.     buPROPion (WELLBUTRIN SR) 200 MG 12 hr tablet Take 1 tablet (200 mg total) by mouth in the morning. 90 tablet 1   Coenzyme Q10 (COQ10 PO) Take 60 mg by mouth daily.     cyanocobalamin (VITAMIN B12) 1000 MCG tablet Take by mouth.     ergocalciferol (VITAMIN D2) 1.25 MG (50000 UT) capsule Take 50,000 Units by mouth 2 (two) times a  week.     lamoTRIgine (LAMICTAL) 100 MG tablet Take 0.5 tablets (50 mg total) by mouth daily.     levothyroxine (SYNTHROID, LEVOTHROID) 25 MCG tablet Take 25 mcg by mouth Daily.      lithium carbonate (ESKALITH) 450 MG ER tablet Take 2 tablets (900 mg total) by mouth daily. 180 tablet 1   magnesium oxide (MAG-OX) 400 MG tablet Take by mouth.     Menaquinone-7 (VITAMIN K2) 100 MCG CAPS Take 1 capsule by mouth daily.     Misc  Natural Products (PROSTATE HEALTH PO) Take 1 capsule by mouth daily.     Multiple Vitamins-Minerals (OCUVITE PRESERVISION PO) Take 1 tablet by mouth daily.     Omega-3 Fatty Acids (FISH OIL) 1000 MG CAPS Take 1,000 mg by mouth daily.     OVER THE COUNTER MEDICATION Take 1 capsule by mouth daily. Adrenogen     potassium citrate (UROCIT-K) 10 MEQ (1080 MG) SR tablet 1 tablet with meals     Probiotic Product (PROBIOTIC DAILY PO) Take 1 capsule by mouth daily.     Quercetin 500 MG CAPS Take 1 capsule by mouth daily.     RESVERATROL PO Take 500 mg by mouth daily.     S-Adenosylmethionine (SAME) 400 MG TABS Take 400 mg by mouth daily.     senna-docusate (SENOKOT-S) 8.6-50 MG tablet Take 1 tablet by mouth 2 (two) times daily. While taking strongest pain meds to prevent constipation. 10 tablet 0   sodium fluoride (FLUORISHIELD) 1.1 % GEL dental gel Place onto teeth.     telmisartan (MICARDIS) 20 MG tablet Take 20 mg by mouth daily.     theophylline (THEO-24) 300 MG 24 hr capsule Take 300 mg by mouth daily. Takes 200 mg for a week , 100 mg for another week and stop     Tryptophan 500 MG CAPS Take 1 capsule by mouth at bedtime.     [START ON 01/09/2024] zaleplon (SONATA) 10 MG capsule Take 1 capsule (10 mg total) by mouth at bedtime as needed for sleep. 30 capsule 3   No current facility-administered medications for this visit.     Musculoskeletal: Strength & Muscle Tone: within normal limits Gait & Station: normal Patient leans: Front  Psychiatric Specialty Exam: Review of  Systems  Psychiatric/Behavioral: Negative.      Blood pressure 128/82, pulse 91, temperature 98.4 F (36.9 C), temperature source Temporal, height 5\' 7"  (1.702 m), weight 171 lb (77.6 kg), SpO2 97%.Body mass index is 26.78 kg/m.  General Appearance: Fairly Groomed  Eye Contact:  Fair  Speech:  Clear and Coherent  Volume:  Normal  Mood:  Euthymic  Affect:  Congruent  Thought Process:  Goal Directed and Descriptions of Associations: Intact  Orientation:  Full (Time, Place, and Person)  Thought Content: Logical   Suicidal Thoughts:  No  Homicidal Thoughts:  No  Memory:  Immediate;   Fair Recent;   Fair Remote;   Fair  Judgement:  Fair  Insight:  Fair  Psychomotor Activity:  Normal  Concentration:  Concentration: Fair and Attention Span: Fair  Recall:  [employer] of Knowledge: Fair  Language: Fair  Akathisia:  No  Handed:  Right  AIMS (if indicated): done  Assets:  Desire for Improvement Housing Social Support Transportation  ADL's:  Intact  Cognition: WNL  Sleep:   overall ok, although it takes 20 to 30 minutes to fall asleep with medication as well as has CPAP issues which causes dry mouth   Screenings: GAD-7    Flowsheet Row Office Visit from 04/01/2023 in [facility]  Total GAD-7 Score 0      PHQ2-9    Flowsheet Row Office Visit from 07/17/2023 in [facility] Visit from 04[facility]01/2023 in [facility] Video Visit from 11/26/2022 in [facility] Video Visit from 10/10/2022 in [facility] Video Visit from 07/17/2022 in [facility]  PHQ-2 Total Score 0 1 0 0 3  PHQ-9 Total Score -- 5 -- -- 9      Flowsheet Row Video Visit from 10/13/2023 in [facility] Video Visit from  08/28/2023 in [facility] Video Visit from 07/03/2023 in [facility]  C-SSRS RISK CATEGORY No Risk No Risk No Risk        Assessment and Plan: Adrian Lewis is a 78 year old Caucasian male who has a history of bipolar disorder type II, insomnia, cognitive disorder likely mild status post thyroidectomy, currently presents for a follow-up appointment, discussed assessment and plan as noted below.  Bipolar Disorder in remission Bipolar disorder managed with lithium carbonate 900 mg daily, effective in preventing depressive episodes. Recent lithium level was 0.6 ( 12/12/2023), within therapeutic range but on the lower end. Lithium orotate not recommended due to lack of prescription and unclear dosing. Emphasized maintaining therapeutic lithium levels to prevent depressive episodes and discussed risks of lithium toxicity and need for regular monitoring. - Continue lithium carbonate 900 mg daily - Monitor lithium levels regularly - Continue Lamictal however will reduce dosage to 50 mg daily with plan to taper it off gradually. - Discuss any changes in mood or side effects  Insomnia-stable Chronic insomnia managed with Sonata (Zaleplon). Reports taking 15-20 minutes to fall asleep with medication, which is within normal range. Lorazepam not recommended due to potential for tolerance, addiction, and serious interactions with alcohol. Emphasized good sleep hygiene and risks of combining lorazepam and alcohol. - Continue Sonata (Zaleplon) 10 mg as prescribed - Avoid lorazepam for sleep - Practice good sleep hygiene, including avoiding alcohol close to bedtime - Consider temazepam if sleep issues persist, but avoid alcohol if used - Continue CPAP therapy - Address dry mouth and CPAP water tank refilling issues  Mild neurocognitive disorder-chronic Patient currently denies any concerns. Patient to continue to follow up  with neurology as needed.  Patient encouraged to continue lithium level repeat on a monthly basis.  Patient agrees to get this done at primary care provider office.   Follow-up - Schedule follow-up appointment in three months via video conference   Collaboration of Care: Collaboration of Care: Other collateral information obtained from wife who was present in session.  As noted above.  Patient/Guardian was advised Release of Information must be obtained prior to any record release in order to collaborate their care with an outside provider. Patient/Guardian was advised if they have not already done so to contact the registration department to sign all necessary forms in order for [place] to release information regarding their care.   Consent: Patient/Guardian gives verbal consent for treatment and assignment of benefits for services provided during this visit. Patient/Guardian expressed understanding and agreed to proceed.   This note was generated in part or whole with voice recognition software. Voice recognition is usually quite accurate but there are transcription errors that can and very often do occur. I apologize for any typographical errors that were not detected and corrected.    Jomarie Longs, MD 12/23/2023, 4:00 PM

## 2023-12-23 NOTE — Patient Instructions (Signed)
Insomnia Insomnia is a sleep disorder that makes it difficult to fall asleep or stay asleep. Insomnia can cause fatigue, low energy, difficulty concentrating, mood swings, and poor performance at work or school. There are three different ways to classify insomnia: Difficulty falling asleep. Difficulty staying asleep. Waking up too early in the morning. Any type of insomnia can be long-term (chronic) or short-term (acute). Both are common. Short-term insomnia usually lasts for 3 months or less. Chronic insomnia occurs at least three times a week for longer than 3 months. What are the causes? Insomnia may be caused by another condition, situation, or substance, such as: Having certain mental health conditions, such as anxiety and depression. Using caffeine, alcohol, tobacco, or drugs. Having gastrointestinal conditions, such as gastroesophageal reflux disease (GERD). Having certain medical conditions. These include: Asthma. Alzheimer's disease. Stroke. Chronic pain. An overactive thyroid gland (hyperthyroidism). Other sleep disorders, such as restless legs syndrome and sleep apnea. Menopause. Sometimes, the cause of insomnia may not be known. What increases the risk? Risk factors for insomnia include: Gender. Females are affected more often than males. Age. Insomnia is more common as people get older. Stress and certain medical and mental health conditions. Lack of exercise. Having an irregular work schedule. This may include working night shifts and traveling between different time zones. What are the signs or symptoms? If you have insomnia, the main symptom is having trouble falling asleep or having trouble staying asleep. This may lead to other symptoms, such as: Feeling tired or having low energy. Feeling nervous about going to sleep. Not feeling rested in the morning. Having trouble concentrating. Feeling irritable, anxious, or depressed. How is this diagnosed? This condition  may be diagnosed based on: Your symptoms and medical history. Your health care provider may ask about: Your sleep habits. Any medical conditions you have. Your mental health. A physical exam. How is this treated? Treatment for insomnia depends on the cause. Treatment may focus on treating an underlying condition that is causing the insomnia. Treatment may also include: Medicines to help you sleep. Counseling or therapy. Lifestyle adjustments to help you sleep better. Follow these instructions at home: Eating and drinking  Limit or avoid alcohol, caffeinated beverages, and products that contain nicotine and tobacco, especially close to bedtime. These can disrupt your sleep. Do not eat a large meal or eat spicy foods right before bedtime. This can lead to digestive discomfort that can make it hard for you to sleep. Sleep habits  Keep a sleep diary to help you and your health care provider figure out what could be causing your insomnia. Write down: When you sleep. When you wake up during the night. How well you sleep and how rested you feel the next day. Any side effects of medicines you are taking. What you eat and drink. Make your bedroom a dark, comfortable place where it is easy to fall asleep. Put up shades or blackout curtains to block light from outside. Use a white noise machine to block noise. Keep the temperature cool. Limit screen use before bedtime. This includes: Not watching TV. Not using your smartphone, tablet, or computer. Stick to a routine that includes going to bed and waking up at the same times every day and night. This can help you fall asleep faster. Consider making a quiet activity, such as reading, part of your nighttime routine. Try to avoid taking naps during the day so that you sleep better at night. Get out of bed if you are still awake after  15 minutes of trying to sleep. Keep the lights down, but try reading or doing a quiet activity. When you feel  sleepy, go back to bed. General instructions Take over-the-counter and prescription medicines only as told by your health care provider. Exercise regularly as told by your health care provider. However, avoid exercising in the hours right before bedtime. Use relaxation techniques to manage stress. Ask your health care provider to suggest some techniques that may work well for you. These may include: Breathing exercises. Routines to release muscle tension. Visualizing peaceful scenes. Make sure that you drive carefully. Do not drive if you feel very sleepy. Keep all follow-up visits. This is important. Contact a health care provider if: You are tired throughout the day. You have trouble in your daily routine due to sleepiness. You continue to have sleep problems, or your sleep problems get worse. Get help right away if: You have thoughts about hurting yourself or someone else. Get help right away if you feel like you may hurt yourself or others, or have thoughts about taking your own life. Go to your nearest emergency room or: Call 911. Call the National Suicide Prevention Lifeline at (437)486-8660 or 988. This is open 24 hours a day. Text the Crisis Text Line at (586)746-2410. Summary Insomnia is a sleep disorder that makes it difficult to fall asleep or stay asleep. Insomnia can be long-term (chronic) or short-term (acute). Treatment for insomnia depends on the cause. Treatment may focus on treating an underlying condition that is causing the insomnia. Keep a sleep diary to help you and your health care provider figure out what could be causing your insomnia. This information is not intended to replace advice given to you by your health care provider. Make sure you discuss any questions you have with your health care provider. Document Revised: 10/22/2021 Document Reviewed: 10/22/2021 Elsevier Patient Education  2024 [employer].

## 2023-12-30 NOTE — Progress Notes (Signed)
 [facility] [address] [address] OUTPATIENT SPEECH PATHOLOGY 12/30/2023   Patient Name: Adrian Lewis Date of Birth:28-Sep-1946 Session Number: 3 Diagnosis:  Encounter Diagnosis  Name Primary?  SABRA Dysphonia Yes       Date of Evaluation: 12/01/23   Referred by: Genell Fairly, MD Reason for Referral: Evaluation Speech, Language, Voice, Cognition     Note Type: Treatment Note  ASSESSMENT:    Next Session Plan: LAB support, excuse me miss review, RV, rote speech, conversational training    OBJECTIVE:    STG 1: Breathing: Pt will demonstrate abdominal breathing for support of phonation with 80% accuracy given minimal cues Pt was 75% accurate for engaging lower abdominal muscles to support airflow during phonation given max fading to mod cues.   STG 2: Resonance: Pt will balance oral/nasal resonance to maximize vocal output with minimal effort/laryngeal hyperfunction with 80% accuracy given minimal cues Pt was 75% accurate for review of /v,z,w/ words and /m/ words, phrases, and sentences in chant and connected speech given max fading to mod cues to increase LAB support and forward focus.   STG 3: Strength/balance: Pt will use balance of vocal fold adduction and airflow to produce voice in structured exercises and spontaneous speech tasks with 80% accuracy given minimal cues Pt was 80% accurate for balanced phonation during semi-occluded vocal tract exercises given mod cues. ( Exercise utilized:tongue trill)  STG 4: Inflection: Pt will vary pitch and/or loudness to demonstrate phonatory flexibility and laryngeal control without laryngeal hyperfunction in sustained phonation exercises and conversational speech with 80% accuracy given minimal cues Not directly targeted  Encouraged implementation of cough/throat clearing suppression strategies. Pt ~70% acc given max cues.      Stimulability: Pt was somewhat stimulable Treatment Recommendations: Continue Treatment  PLAN:  SLP  Follow-up / Frequency: 1x week for Planned Treatment Duration : 1 session per week-every other week x9 over 3 months   Planned Interventions: Respiratory muscle training, Resonant Voice Therapy Semi-occluded Vocal Tract Exercises, Financial Controller, Functional Phonation Tasks  Prognosis:  Excellent   Negative Prognosis Rationale: Time post onset     Positive Prognosis Rationale: Age, Motivation, Good caregiver/family support    Goals:  Patient and Family Goals: Improve vocal function   STG 1: Breathing: Pt will demonstrate abdominal breathing for support of phonation with 80% accuracy given minimal cues   STG 2: Resonance: Pt will balance oral/nasal resonance to maximize vocal output with minimal effort/laryngeal hyperfunction with 80% accuracy given minimal cues   STG 3: Strength/balance: Pt will use balance of vocal fold adduction and airflow to produce voice in structured exercises and spontaneous speech tasks with 80% accuracy given minimal cues   STG 4: Inflection: Pt will vary pitch and/or loudness to demonstrate phonatory flexibility and laryngeal control without laryngeal hyperfunction in sustained phonation exercises and conversational speech with 80% accuracy given minimal cues                   Time Frame: 3  Duration: months  LTG #1: At the completion of Tx, Pt will demonstrate ability to perform voice therapy techniques independently to maintain optimal laryngeal function (+/-)            Time Frame: 3  Duration: months     SUBJECTIVE:  Pt reports positive feedback from others in regards to voice this week.   Pain?: No     Precautions: Aspiration   Education Provided: Patient, Family, Other caregiver, Strategies to maximize voicing, Importance of Therapy, Home  Exercise Program   Response to Education: Understanding verbalized      Session Duration : 45    Today's Charges (noted here with $$): SLP Treatment Charges $$ 92507-Treatment S/L/V - individual  [mins]: 45       The patient reports they are physically located in [place]  and is currently: at home. I conducted a audio/video visit. I spent 45 minutes on the video call with the patient. I spent an additional 5 minutes on pre- and post-visit activities on the date of service .    I attest that I have reviewed the above information. Signed: Lillian DELENA Parkins, SLP 12/30/2023 9:57 AM

## 2024-01-02 ENCOUNTER — Ambulatory Visit: Payer: Medicare Other

## 2024-01-02 NOTE — Progress Notes (Signed)
 Patient presents today to pick up custom molded foot orthotics, diagnosed with Arthritis BIL feet by Dr. Verta.   Orthotics were dispensed and fit was satisfactory. Reviewed instructions for break-in and wear. Written instructions given to patient.  Patient will follow up as needed. Lolita Schultze Cped, CFo, CFm

## 2024-01-14 ENCOUNTER — Ambulatory Visit: Payer: Medicare Other | Admitting: Podiatry

## 2024-01-19 ENCOUNTER — Telehealth: Payer: Self-pay

## 2024-01-19 DIAGNOSIS — Z79899 Other long term (current) drug therapy: Secondary | ICD-10-CM

## 2024-01-19 NOTE — Telephone Encounter (Signed)
 pt wife called states that per dr. Terance Hart office they will not do no more than every 6 months to check lithium levels. if you want it done every month then you need to put a standing order into the [facility] on [address] to have done.

## 2024-01-19 NOTE — Telephone Encounter (Signed)
 when i go in to print the order the account # is not on the order. that is possibly the issue that they are having. I'll put them in your basket to let you see.

## 2024-01-19 NOTE — Telephone Encounter (Signed)
 That is exactly what we have talked about in a recent meeting that this has been an issue.

## 2024-01-19 NOTE — Telephone Encounter (Signed)
 We did standing order before and [facility] had trouble , when patient went there , they said they did not have the order although we had it ordered . Please check with patient if he wants to come pick up lab slips from [place] for the next 6 months - hard copy .

## 2024-01-19 NOTE — Telephone Encounter (Signed)
 can you get that ready

## 2024-01-19 NOTE — Telephone Encounter (Signed)
 I have printed out hard copy for lithium level for patient, multiple. When I tried to order standing order for lithium level for every month through the system there are technical issues.  Patient to get lithium level and BUN creatinine completed in 6 months from now at primary care provider's office.  Until then he can repeat lithium level every month unless there are any changes.

## 2024-01-20 NOTE — Telephone Encounter (Signed)
 wife wants them mailed. they labwork orders were mailed out today

## 2024-01-21 ENCOUNTER — Encounter: Payer: Self-pay | Admitting: Podiatry

## 2024-01-21 ENCOUNTER — Ambulatory Visit (INDEPENDENT_AMBULATORY_CARE_PROVIDER_SITE_OTHER): Payer: Medicare Other | Admitting: Podiatry

## 2024-01-21 DIAGNOSIS — D2372 Other benign neoplasm of skin of left lower limb, including hip: Secondary | ICD-10-CM | POA: Diagnosis not present

## 2024-01-21 DIAGNOSIS — M19071 Primary osteoarthritis, right ankle and foot: Secondary | ICD-10-CM

## 2024-01-21 DIAGNOSIS — M79676 Pain in unspecified toe(s): Secondary | ICD-10-CM

## 2024-01-21 DIAGNOSIS — M19072 Primary osteoarthritis, left ankle and foot: Secondary | ICD-10-CM | POA: Diagnosis not present

## 2024-01-21 DIAGNOSIS — B351 Tinea unguium: Secondary | ICD-10-CM | POA: Diagnosis not present

## 2024-01-21 MED ORDER — TRIAMCINOLONE ACETONIDE 40 MG/ML IJ SUSP
40.0000 mg | Freq: Once | INTRAMUSCULAR | Status: AC
  Administered 2024-01-21: 40 mg

## 2024-01-21 NOTE — Progress Notes (Signed)
 He presents today complaining of painful elongated toenails and painful calluses to the forefoot bilaterally and painful bones.  He states that these joints are just killing me I do not think is the neuropathy so much as it is the arthritis.  Objective: Vital signs are stable alert oriented x 3 pulses are palpable.  With overlapping toes plantarflexed metatarsals resulting in capsulitis of the forefoot with pain on palpation.  Toenails are long thick yellow dystrophic clinical mycotic.  Assessment: Pain in limb secondary to onychomycosis and osteoarthritis and capsulitis.  Plan: I injected the forefoot bilaterally today to help alleviate his symptoms.  I debrided his nails for him today.  He will follow-up with [place] in a couple of months

## 2024-02-14 LAB — LITHIUM LEVEL: Lithium Lvl: 0.8 mmol/L (ref 0.5–1.2)

## 2024-02-16 ENCOUNTER — Telehealth: Payer: Self-pay | Admitting: Psychiatry

## 2024-02-16 NOTE — Telephone Encounter (Signed)
 Lithium level reviewed dated 02/13/2024-0.8-therapeutic. Patient to continue current medication regimen.

## 2024-02-17 NOTE — Telephone Encounter (Signed)
 pt and pt wife was notified

## 2024-02-18 ENCOUNTER — Other Ambulatory Visit

## 2024-03-20 LAB — LITHIUM LEVEL: Lithium Lvl: 0.7 mmol/L (ref 0.5–1.2)

## 2024-03-22 ENCOUNTER — Encounter: Payer: Self-pay | Admitting: Psychiatry

## 2024-03-22 ENCOUNTER — Telehealth (INDEPENDENT_AMBULATORY_CARE_PROVIDER_SITE_OTHER): Payer: Self-pay | Admitting: Psychiatry

## 2024-03-22 DIAGNOSIS — F09 Unspecified mental disorder due to known physiological condition: Secondary | ICD-10-CM | POA: Diagnosis not present

## 2024-03-22 DIAGNOSIS — G4701 Insomnia due to medical condition: Secondary | ICD-10-CM

## 2024-03-22 DIAGNOSIS — F3181 Bipolar II disorder: Secondary | ICD-10-CM

## 2024-03-22 DIAGNOSIS — Z79899 Other long term (current) drug therapy: Secondary | ICD-10-CM

## 2024-03-22 MED ORDER — LAMOTRIGINE 100 MG PO TABS: 100.0000 mg | ORAL_TABLET | Freq: Every day | ORAL | Status: DC

## 2024-03-22 NOTE — Progress Notes (Signed)
 Virtual Visit via Video Note  I connected with Adrian Lewis on 03/22/24 at  3:30 PM EDT by a video enabled telemedicine application and verified that I am speaking with the correct person using two identifiers.  Location Provider Location : [facility] Patient Location : Home  Participants: Patient , Provider    I discussed the limitations of evaluation and management by telemedicine and the availability of in person appointments. The patient expressed understanding and agreed to proceed   I discussed the assessment and treatment plan with the patient. The patient was provided an opportunity to ask questions and all were answered. The patient agreed with the plan and demonstrated an understanding of the instructions.   The patient was advised to call back or seek an in-person evaluation if the symptoms worsen or if the condition fails to improve as anticipated.   BH MD OP Progress Note  03/22/2024 4:03 PM Adrian Lewis  MRN:  [id number]  Chief Complaint:  Chief Complaint  Patient presents with   Follow-up   Depression   Insomnia   Medication Refill   Discussed the use of AI scribe software for clinical note transcription with the patient, who gave verbal consent to proceed.  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    He is experiencing periodic depressive symptoms and believes his current lithium  dosage of 900 mg is inadequate. He recalls previously taking 1350 mg before his surgery in August, which he felt was more effective. His current lithium  level is  0.7, within the therapeutic range. He is also taking Lamictal  at 100 mg, which he finds tolerable at this dose. He previously experienced jitteriness and a 'wired' feeling, currently denies any side effects.  He continues to be compliant on the Wellbutrin .  Denies side effects.  Patient reports sleep is overall good.  Does use Sonata  for sleep as needed.  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 He has tried various treatments, including experimental therapy with Dr. Loreli, but nothing has provided long-term relief. He uses a Japanese massage machine, red light boots, and TENS stimulation for temporary relief. He does not use any painkillers.  He maintains an active lifestyle, working on his farm daily despite the neuropathy pain. He reports sleeping well at night. He has regular lithium  lab tests done monthly, and his lab results are electronically available at [facility].  He did not express any suicidality, homicidality or perceptual disturbances.  Visit Diagnosis:    ICD-10-CM   1. Bipolar 2 disorder, major depressive episode (HCC)  F31.81    In partial remission    2. Insomnia due to medical condition  G47.01    OSA on CPAP, bipolar disorder, pain    3. Mild cognitive disorder  F09 lamoTRIgine  (LAMICTAL ) 100 MG tablet    4. High risk medication use  Z79.899       Past Psychiatric History: I have reviewed past psychiatric history from progress note on 01/21/2022.  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   Elevated hemoglobin A1c 08/07/2021    Excessive drinking of alcohol 01/01/2023   01/01/23 - reported 4 hard ciders (4.5% ABV) nightly   Generalized anxiety disorder 06/16/2017   GERD (gastroesophageal reflux disease)    History of hernia repair 05/14/2012   History of kidney stones    Hyperlipidemia, mixed 12/19/2020   Hyponatremia 10/08/2013   Hypothyroidism    Insomnia due to medical condition 06/16/2017   Macular degeneration    Major depressive disorder    Medication overdose 06/16/2017   Mild cognitive impairment of uncertain or unknown etiology 01/01/2023   Nephrolithiasis 01/25/2020   Obstructive sleep apnea 10/07/2013   Osteopenia 04/04/2014   Paresthesia of both feet 12/03/2022   Radiculopathy 02/24/2013   Rising PSA level 08/07/2021   Shortness of breath on exertion 12/19/2020   Tremor 03/19/2022    Past Surgical History:  Procedure Laterality Date   CATARACT EXTRACTION W/ INTRAOCULAR LENS IMPLANT  2011 (APPROX)   RIGHT EYE AND REPAIR DETACHED RETINA   CYSTOSCOPY WITH RETROGRADE PYELOGRAM, URETEROSCOPY AND STENT PLACEMENT Bilateral 02/23/2021   Procedure: CYSTOSCOPY WITH RETROGRADE PYELOGRAM, URETEROSCOPY AND STENT PLACEMENT;  Surgeon: Alvaro Hummer, MD;  Location: [facility];  Service: Urology;  Laterality: Bilateral;  75 MINS   EYE SURGERY     HAMMER TOE SURGERY  2012  &  2010  (APPROX)   ONE TOE , EACH FOOT   HERNIA REPAIR Bilateral    Inguinal Hernia Repair   HOLMIUM LASER APPLICATION Bilateral 02/23/2021   Procedure: HOLMIUM LASER APPLICATION;  Surgeon: Alvaro Hummer, MD;  Location: [facility];  Service: Urology;  Laterality: Bilateral;   JOINT REPLACEMENT Left 2014   Partial Knee Replacement   KNEE SURGERY Left    x 2  partial and reconstructive   NASAL TURBINATE REDUCTION Bilateral 09/27/2015   Procedure: TURBINATE REDUCTION/SUBMUCOSAL RESECTION;  Surgeon: Deward Dolly, MD;  Location: [facility];  Service: ENT;  Laterality: Bilateral;   REPAIR RIGHT INGUINAL HERNIA W/ MESH  02-14-2006   SEPTOPLASTY N/A  09/27/2015   Procedure: SEPTOPLASTY;  Surgeon: Deward Dolly, MD;  Location: [facility];  Service: ENT;  Laterality: N/A;   URETEROSCOPY  08/12/2012   Procedure: URETEROSCOPY;  Surgeon: Hummer Alvaro, MD;  Location: [facility];  Service: Urology;  Laterality: Left;  1 HR     Family Psychiatric History: I have reviewed family psychiatric history from progress note on 01/21/2022.  Family History:  Family History  Problem Relation Age of Onset   Dementia Mother        with advanced age; ~58s   Heart attack Father    Suicidality Sister    Suicidality Brother    Parkinson's disease Maternal Aunt     Social History: I have reviewed social history from progress note on 01/21/2022. Social History   Socioeconomic History   Marital status: Married    Spouse name: Not on file   Number of children: 2   Years of education: 20   Highest education level: Doctorate  Occupational History   Occupation: Retired    Comment: English professor  Tobacco Use   Smoking status: Never   Smokeless tobacco: Never  Vaping Use   Vaping status: Never Used  Substance and Sexual Activity   Alcohol use:  Yes    Alcohol/week: 28.0 standard drinks of alcohol    Types: 28 Standard drinks or equivalent per week    Comment: 4 hard ciders nightly   Drug use: No   Sexual activity: Not Currently  Other Topics Concern   Not on file  Social History Narrative   Right handed   Drinks caffeine   Two story home   Lives with wife in the home   retired   Social Drivers of Health   Financial Resource Strain: Patient Declined (12/11/2023)   Received from [facility]   Overall Financial Resource Strain (CARDIA)    Difficulty of Paying Living Expenses: Patient declined  Food Insecurity: Patient Declined (12/11/2023)   Received from [facility]   Hunger Vital Sign    Worried About Running Out of Food in the Last Year: Patient declined    Ran Out of Food in the Last  Year: Patient declined  Transportation Needs: Patient Declined (12/11/2023)   Received from [facility] - Transportation    In the past 12 months, has lack of transportation kept you from medical appointments or from getting medications?: Patient declined    Lack of Transportation (Non-Medical): Patient declined  Physical Activity: Not on file  Stress: Not on file  Social Connections: Not on file    Allergies:  Allergies  Allergen Reactions   Bee Venom Anaphylaxis    Has EPI pen for this   Cat Dander Other (See Comments)    Per pt his nose runs/congestion.    Clonidine Other (See Comments)    fatigue fatigue fatigue   Grass Pollen(K-O-R-T-Swt Vern) Other (See Comments)    Runny nose   Other Other (See Comments)    Per pt his nose runs/congestion.    Metabolic Disorder Labs: No results found for: HGBA1C, MPG No results found for: PROLACTIN No results found for: CHOL, TRIG, HDL, CHOLHDL, VLDL, LDLCALC Lab Results  Component Value Date   TSH 1.800 11/11/2023   TSH 1.310 11/06/2022    Therapeutic Level Labs: Lab Results  Component Value Date   LITHIUM  0.7 03/19/2024   LITHIUM  0.8 02/13/2024   No results found for: VALPROATE No results found for: CBMZ  Current Medications: Current Outpatient Medications  Medication Sig Dispense Refill   acetaminophen  (TYLENOL ) 500 MG tablet Take by mouth.     Ascorbic Acid (VITAMIN C) 1000 MG tablet Take 1,000 mg by mouth daily.     b complex vitamins capsule Take 1 capsule by mouth daily.     buPROPion  (WELLBUTRIN  SR) 200 MG 12 hr tablet Take 1 tablet (200 mg total) by mouth in the morning. 90 tablet 1   Coenzyme Q10 (COQ10 PO) Take 60 mg by mouth daily.     cyanocobalamin (VITAMIN B12) 1000 MCG tablet Take by mouth.     ergocalciferol  (VITAMIN D2) 1.25 MG (50000 UT) capsule Take 50,000 Units by mouth 2 (two) times a week.     lamoTRIgine  (LAMICTAL ) 100 MG tablet Take 1 tablet (100  mg total) by mouth daily.     levothyroxine (SYNTHROID, LEVOTHROID) 25 MCG tablet Take 25 mcg by mouth Daily.      lithium  carbonate (ESKALITH ) 450 MG ER tablet Take 2 tablets (900 mg total) by mouth daily. 180 tablet 1   magnesium oxide (MAG-OX) 400 MG tablet Take by mouth.     Menaquinone-7 (VITAMIN K2) 100 MCG CAPS Take 1 capsule by mouth daily.  Misc Natural Products (PROSTATE HEALTH PO) Take 1 capsule by mouth daily.     Multiple Vitamins-Minerals (OCUVITE PRESERVISION PO) Take 1 tablet by mouth daily.     Omega-3 Fatty Acids (FISH OIL) 1000 MG CAPS Take 1,000 mg by mouth daily.     OVER THE COUNTER MEDICATION Take 1 capsule by mouth daily. Adrenogen     potassium citrate (UROCIT-K) 10 MEQ (1080 MG) SR tablet 1 tablet with meals     Probiotic Product (PROBIOTIC DAILY PO) Take 1 capsule by mouth daily.     Quercetin 500 MG CAPS Take 1 capsule by mouth daily.     RESVERATROL PO Take 500 mg by mouth daily.     S-Adenosylmethionine (SAME) 400 MG TABS Take 400 mg by mouth daily.     senna-docusate (SENOKOT-S) 8.6-50 MG tablet Take 1 tablet by mouth 2 (two) times daily. While taking strongest pain meds to prevent constipation. 10 tablet 0   sodium fluoride (FLUORISHIELD) 1.1 % GEL dental gel Place onto teeth.     telmisartan (MICARDIS) 20 MG tablet Take 20 mg by mouth daily.     theophylline (THEO-24) 300 MG 24 hr capsule Take 300 mg by mouth daily. Takes 200 mg for a week , 100 mg for another week and stop     Tryptophan 500 MG CAPS Take 1 capsule by mouth at bedtime.     zaleplon  (SONATA ) 10 MG capsule Take 1 capsule (10 mg total) by mouth at bedtime as needed for sleep. 30 capsule 3   No current facility-administered medications for this visit.     Musculoskeletal: Strength & Muscle Tone:  UTA Gait & Station:  Seated Patient leans: N/A  Psychiatric Specialty Exam: Review of Systems  Psychiatric/Behavioral:  Positive for dysphoric mood.     There were no vitals taken for this  visit.There is no height or weight on file to calculate BMI.  General Appearance: Casual  Eye Contact:  Fair  Speech:  Clear and Coherent  Volume:  Normal  Mood:  Dysphoric at times per report although current medications are helpful for  depression symptoms  Affect:  Appropriate  Thought Process:  Goal Directed and Descriptions of Associations: Intact  Orientation:  Full (Time, Place, and Person)  Thought Content: Logical   Suicidal Thoughts:  No  Homicidal Thoughts:  No  Memory:  Immediate;   Fair Recent;   Fair Remote;   Fair  Judgement:  Fair  Insight:  Fair  Psychomotor Activity:  Normal  Concentration:  Concentration: Fair and Attention Span: Fair  Recall:  [employer] of Knowledge: Fair  Language: Fair  Akathisia:  No  Handed:  Right  AIMS (if indicated): not done  Assets:  Desire for Improvement Housing Social Support Transportation  ADL's:  Intact  Cognition: WNL  Sleep:  Fair   Screenings: GAD-7    Garment/textile Technologist Visit from 12/23/2023 in [facility] Office Visit from 04/01/2023 in [facility]  Total GAD-7 Score 4 0      PHQ2-9    Flowsheet Row Office Visit from 12/23/2023 in [facility] Office Visit from 07/17/2023 in [facility] Visit from 04/01/2023 in [facility] Video Visit from 11/26/2022 in [facility] Video Visit from 10/10/2022 in [facility]  PHQ-2 Total Score 2 0 1 0 0  PHQ-9 Total Score 5 -- 5 -- --      Flowsheet Row Video Visit from 03/22/2024 in [facility] Office Visit from 12/23/2023 in [facility] Video Visit from 10/13/2023 in [facility]  C-SSRS RISK CATEGORY No Risk No Risk No Risk        Assessment and Plan:Moishy J Ozga is a 78 year old Caucasian male, has a history of bipolar disorder, insomnia, cognitive disorder likely mild, history of thyroidectomy, history of cardiac problems presented for a follow-up appointment, discussed assessment and plan as noted below.  Assessment & Plan Bipolar disorder type II depression-stable Intermittent depression despite therapeutic lithium  level of 0.7. Reports periodic depressive episodes and believes increasing lithium  to previous dose of 1350 mg may help. Current dose is 900 mg (2 tablets). Not interested in increasing Lamictal  due to previous side effects of jitteriness and feeling wired. Prefers to avoid atypical antipsychotics (Abilify, Vraylar, Rexulti, Latuda) due to potential cardiac side effects and desire to simplify life. Cardiology clearance required for any medication changes due to cardiac side effects. - Consult Ms.Alan Mam regarding potential increase in lithium  dose and alternative antidepressants. - I have sent communication to cardiology, awaiting response.  Patient also agrees to reach out. - Monitor depression symptoms and contact provider if symptoms persist. - Consider referral to new cardiologist if depression symptoms persist and cardiology clearance is needed. - Continue Lithium  carbonate 900 mg daily - Continue Bupropion  SR 200 mg daily in the morning. - Continue Lamictal  100 mg daily, patient stayed on the 100 mg dosage although this was reduced to 50 mg last visit due to side effect concerns.  Insomnia-stable Chronic insomnia currently managed with Sonata .  Reports overall sleep is good.  Emphasized good sleep hygiene.  Does have a history of sleep apnea currently on CPAP. - Continue Sonata  10 mg daily at bedtime. - Continue to practice good sleep hygiene including avoiding alcohol close to bedtime. - Continue CPAP  therapy. - Reviewed  PMP AWARxE   High risk medication use-patient will benefit from continued monitoring of lithium  level.  Most recent lithium  level dated 03/19/2024-reviewed and discussed-0.7-therapeutic. Patient will benefit from repeat BUN/creatinine and TSH.  Patient reports he had labs completed this morning and will fax the results to this provider.  Will consider ordering these labs if unable to obtain most recent report.  Follow-up Follow-up in clinic in 2 to 3 months or sooner if needed.  Collaboration of Care: Collaboration of Care: Other patient encouraged to follow up with cardiology.  Patient/Guardian was advised Release of Information must be obtained prior to any record release in order to collaborate their care with an outside provider. Patient/Guardian was advised if they have not already done so to contact the registration department to sign all necessary forms in order for us  to release information regarding their care.   Consent: Patient/Guardian gives verbal consent for treatment and assignment of benefits for services provided during this visit. Patient/Guardian expressed understanding and agreed to proceed.  This note was generated in part or whole with voice recognition software. Voice recognition is usually quite accurate but there are transcription errors that can and very often do occur. I apologize for any typographical errors that were not detected and corrected.     Jasn Xia, MD 03/24/2024, 8:29 AM

## 2024-03-26 ENCOUNTER — Ambulatory Visit

## 2024-03-26 ENCOUNTER — Telehealth: Payer: Self-pay | Admitting: Psychiatry

## 2024-03-26 NOTE — Telephone Encounter (Signed)
 Received response back from cardiology that it is okay to go up on the lithium  dosage as long as EKGs monitored and that after increasing lithium  dose an EKG is required in a week from the dosage increase. Will have staff contact patient to discuss.

## 2024-03-26 NOTE — Progress Notes (Signed)
 Patient was here for me to check fit looks as if patient is receiving adequate support from [facility] foot orthotics  Patient will call if any problems arise   Britton Cane CPed, CFo, CFm

## 2024-03-29 NOTE — Telephone Encounter (Signed)
 It was the PA at the cardiology office , Patient provided the contact name last visit - Shereen Dike .

## 2024-03-29 NOTE — Telephone Encounter (Signed)
 spoke with pt wife per pt order (he had stuff all over his hands and asked if i could speak with his wife) she was given the information she states that the PA he was seening in cardiologist left and he does not have an appt with another cardiologist for about a year out.  She states that he see his pcp dr. Vila Grayer this week.   She wanted to know who you had spoke with?

## 2024-03-30 NOTE — Telephone Encounter (Signed)
 Pt wife was notified

## 2024-03-31 ENCOUNTER — Encounter: Payer: Self-pay | Admitting: Podiatry

## 2024-03-31 ENCOUNTER — Ambulatory Visit (INDEPENDENT_AMBULATORY_CARE_PROVIDER_SITE_OTHER): Admitting: Podiatry

## 2024-03-31 DIAGNOSIS — M79676 Pain in unspecified toe(s): Secondary | ICD-10-CM | POA: Diagnosis not present

## 2024-03-31 DIAGNOSIS — D2372 Other benign neoplasm of skin of left lower limb, including hip: Secondary | ICD-10-CM

## 2024-03-31 DIAGNOSIS — B351 Tinea unguium: Secondary | ICD-10-CM

## 2024-03-31 NOTE — Progress Notes (Signed)
 He presents today with several concerns.  He is questioning as to whether or not we can inject around the second metatarsal phalangeal joints or whether or not it is too early.  He is also complaining of severely painful toe third left with elongated nails bilaterally.  He does relate that his neuropathy treatment by [facility] neurology has made him feel better.  Objective: Vital signs are stable he is alert oriented x 3 pulses are palpable.  Digital deformities resulted in a distal clavus which is exquisitely painful third digit left foot.  There is no erythema edema cellulitis drainage or odor his nails are thick yellow dystrophic like mycotic he does have tenderness on palpation around the second metatarsophalangeal joints.  Assessment: Painful nail dystrophy bilaterally painful benign skin lesions.  And painful plantarflexed second metatarsals bilateral.  Plan: Discussed etiology pathology conservative surgical therapies at this point it is about a month too soon for another set of injections.  However we did debride all benign skin lesions we also debrided toenails 1 through 5 bilaterally.  I will follow-up with him in the near future for injections around the second metatarsal phalangeal joint.

## 2024-04-01 ENCOUNTER — Ambulatory Visit: Admitting: Internal Medicine

## 2024-04-24 LAB — LITHIUM LEVEL: Lithium Lvl: 0.7 mmol/L (ref 0.5–1.2)

## 2024-04-26 ENCOUNTER — Ambulatory Visit: Payer: Self-pay | Admitting: Psychiatry

## 2024-04-28 ENCOUNTER — Ambulatory Visit: Admitting: Podiatry

## 2024-05-10 ENCOUNTER — Encounter: Payer: Self-pay | Admitting: Podiatry

## 2024-05-10 ENCOUNTER — Ambulatory Visit (INDEPENDENT_AMBULATORY_CARE_PROVIDER_SITE_OTHER): Admitting: Podiatry

## 2024-05-10 DIAGNOSIS — M19071 Primary osteoarthritis, right ankle and foot: Secondary | ICD-10-CM | POA: Diagnosis not present

## 2024-05-10 DIAGNOSIS — M19072 Primary osteoarthritis, left ankle and foot: Secondary | ICD-10-CM | POA: Diagnosis not present

## 2024-05-10 DIAGNOSIS — M79676 Pain in unspecified toe(s): Secondary | ICD-10-CM | POA: Diagnosis not present

## 2024-05-10 DIAGNOSIS — B351 Tinea unguium: Secondary | ICD-10-CM | POA: Diagnosis not present

## 2024-05-10 DIAGNOSIS — D2372 Other benign neoplasm of skin of left lower limb, including hip: Secondary | ICD-10-CM | POA: Diagnosis not present

## 2024-05-10 MED ORDER — TRIAMCINOLONE ACETONIDE 40 MG/ML IJ SUSP: 40.0000 mg | Freq: Once | INTRAMUSCULAR | Status: AC

## 2024-05-10 NOTE — Progress Notes (Signed)
 He presents today complaining of painful arthritis bilateral forefoot.  He is also wants me to check the end of the third toe left.  He is also wanting me to trim calluses and his toenails.  Objective: Vital signs are stable alert oriented x 3.  Pulses are palpable.  There is no erythema edema cellulitis drainage or odor though he does have pain on palpation to the 3rd and 4th metatarsal phalangeal joint area of the left foot similarly on the right foot.  His toenails are long thick yellow dystrophic like mycotic and he does have multiple benign skin lesions notably to the third toe with plantar aspect of the forefoot.  Assessment: Pain in limb secondary to onychomycosis.  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  Pain in limb secondary to benign skin lesions.  Plan: Discussed etiology pathology conservative surgical therapies at this point I injected around his third fourth metatarsal phalangeal joints for the arthritis and capsulitis.  I injected him with Kenalog  and local anesthetic.  Debrided toenails 1 through 5 bilateral and debrided all benign hyperkeratotic lesions.  He has severe digital deformities and significant osteoarthritis of the midfoot bilaterally.  I did recommend that he follow-up for his next visit in the near future with Dr. Michalene Agee just for new x-rays and a discussion as to whether or not anything surgically can be done.  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

## 2024-05-17 ENCOUNTER — Ambulatory Visit (INDEPENDENT_AMBULATORY_CARE_PROVIDER_SITE_OTHER): Admitting: Podiatry

## 2024-05-17 ENCOUNTER — Telehealth: Payer: Self-pay

## 2024-05-17 ENCOUNTER — Encounter: Payer: Self-pay | Admitting: Podiatry

## 2024-05-17 DIAGNOSIS — M7741 Metatarsalgia, right foot: Secondary | ICD-10-CM

## 2024-05-17 DIAGNOSIS — M2042 Other hammer toe(s) (acquired), left foot: Secondary | ICD-10-CM

## 2024-05-17 DIAGNOSIS — M19071 Primary osteoarthritis, right ankle and foot: Secondary | ICD-10-CM

## 2024-05-17 DIAGNOSIS — M19072 Primary osteoarthritis, left ankle and foot: Secondary | ICD-10-CM

## 2024-05-17 DIAGNOSIS — M2041 Other hammer toe(s) (acquired), right foot: Secondary | ICD-10-CM

## 2024-05-17 DIAGNOSIS — M7742 Metatarsalgia, left foot: Secondary | ICD-10-CM

## 2024-05-17 DIAGNOSIS — G4701 Insomnia due to medical condition: Secondary | ICD-10-CM

## 2024-05-17 MED ORDER — ZALEPLON 10 MG PO CAPS: 10.0000 mg | ORAL_CAPSULE | Freq: Every evening | ORAL | 3 refills | Status: DC | PRN

## 2024-05-17 NOTE — Patient Instructions (Signed)
Call [facility] to schedule your CT at the below locations.  Please allow at least 1 business day after your visit to process the referral.  It may take longer depending on approval from insurance.  Please let me know if you have issues or problems scheduling the CT   [facility] [phone] [address] [place],  [address]  [facility] [place] [phone] [address] [place],  [address]  

## 2024-05-17 NOTE — Telephone Encounter (Signed)
 I have sent Sonata  to pharmacy as requested.

## 2024-05-17 NOTE — Telephone Encounter (Signed)
 Received fax requesting a refill for the patients zaleplon  (SONATA ) 10 MG capsule Spoke to Effie at the pharmacy she stated that the patient does not have any remaining refills on file please advise  Last visit 04-26-24 Next visit 06-21-24   Preferred Pharmacies   [id number] - [place], [place] - [address] Phone: [phone]  Fax: [phone]

## 2024-05-17 NOTE — Progress Notes (Signed)
 Subjective:  Patient ID: Adrian Lewis, male    DOB: 04/11/1946,  MRN: [id number]  Chief Complaint  Patient presents with   Arthritis    I'm here to see Dr. Silva.  Dr. Verta referred me to him.  I have terrible Neuropathy and Arthritis of my feet.  I'm in constant pain.    Discussed the use of AI scribe software for clinical note transcription with the patient, who gave verbal consent to proceed.  History of Present Illness Adrian Lewis is a 78 year old male who presents with severe neuropathy and arthritis in his feet. He was referred by Dr. Verta for evaluation of his foot pain and neuropathy.  He has been experiencing severe neuropathy and arthritis in his feet for about a year, starting around April or May of the previous year. The pain is constant, especially when warm, and limits his ability to walk, affecting his gait and balance. The pain is primarily in the balls of his feet, with the neuropathy being intermittent but intense.  He has tried various treatments for neuropathy without much success. Cortisone shots received last week did not help much. He also underwent scrambler therapy for twelve sessions, which initially provided some relief but the symptoms returned. The therapy is expensive and not covered by insurance, making it difficult to continue.  He denies having diabetes and has no history of heart attacks or strokes. 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  He does not take any pain medication and has concerns about the progression of his condition. The arthritis pain is currently worse than the neuropathy. He has had bones fused in his left foot previously, which was successful but required a difficult recovery period of non-weight bearing  for six weeks.      Objective:    Physical Exam VASCULAR: DP and PT pulses palpable. Foot is warm and well-perfused. Capillary fill time is brisk. DERMATOLOGIC: Normal skin turgor, texture, and temperature. No open lesions, rashes, or ulcerations. NEUROLOGIC: Decreased peripheral sensation in feet. No paresthesias on examination. ORTHOPEDIC: Pain and swelling in the midfoot and forefoot on the MTPJs with tender metatarsal heads. Digital contractures are semi-reducible with severe contractures on the right foot.     No images are attached to the encounter.    Results RADIOLOGY Foot X-ray: Severe osteoarthritis of the mid tarsal joint, digital contractures, interphalangeal and metatarsophalangeal joint (MTPJ) mild arthritic changes (11/2022)   Assessment:   1. Arthritis of midtarsal joint of left foot   2. Arthritis of midtarsal joint of right foot   3. Metatarsalgia of both feet   4. Hammertoe of left foot   5. Hammertoe of right foot      Plan:  Patient was evaluated and treated and all questions answered.  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 Radiographs from January 2024 show severe osteoarthritis of the mid tarsal joint, digital contractures, and mild arthritic changes in the interphalangeal and MTP joints. The condition has significantly altered gait and balance, raising concerns for long-term mobility. Previous cortisone injections have provided limited relief, and he is considering surgical  options. Discussed potential surgical interventions, including hammer toe surgery and joint fusion, which would require a significant recovery period with non-weight bearing and use of a knee scooter. He is hesitant about surgery due to the recovery demands and is considering timing  for potential surgery in the fall or winter. Discussed the risks of surgery, including a two to three-month recovery period, the need for non-weight bearing, and the potential for loss of motion in the big toe. The CT scan will help map out the joints involved but will not change the recovery prognosis. - Order CT scans of both feet to evaluate joint levels and plan surgical intervention. - Discuss surgical options, including hammer toe surgery and joint fusion. Likely looking at PIPJ fusion, met head resections, 1st MTP and TMT fusions - Consider timing for potential surgery in the fall or winter. - Encourage use of a knee scooter to assess feasibility for post-surgical recovery.  Peripheral neuropathy Chronic peripheral neuropathy with intermittent, intense pain and stiffness, primarily affecting the balls of the feet. The neuropathy is not associated with diabetes and has been resistant to various treatments, including cortisone injections and scrambler therapy. He reports that scrambler therapy provided temporary relief but is costly and not covered by insurance. Surgical options for arthritis will not alleviate neuropathy symptoms, as there are no surgical interventions for neuropathy itself. Discussed the limitations of surgical intervention for neuropathy symptoms and the need to manage pain levels. - Discuss the limitations of surgical intervention for neuropathy symptoms.      Return in about 4 weeks (around 06/14/2024) for CT results, surgery planning.

## 2024-05-19 ENCOUNTER — Encounter: Payer: Self-pay | Admitting: Internal Medicine

## 2024-05-19 ENCOUNTER — Ambulatory Visit (INDEPENDENT_AMBULATORY_CARE_PROVIDER_SITE_OTHER): Admitting: Internal Medicine

## 2024-05-19 VITALS — BP 118/80 | HR 85 | Temp 98.1°F | Ht 67.0 in | Wt 174.4 lb

## 2024-05-19 DIAGNOSIS — G4733 Obstructive sleep apnea (adult) (pediatric): Secondary | ICD-10-CM

## 2024-05-19 NOTE — Progress Notes (Signed)
 Name: Adrian Lewis MRN: [id number] DOB: May 28, 1946    CHIEF COMPLAINT:  Follow-up assessment for sleep apnea Patient has diagnosis of moderate sleep apnea AHI of 22 Previous CPAP download from January 2024 reports 100% compliance with AHI of 1.2     HISTORY OF PRESENT ILLNESS:  Discussed sleep data and reviewed with patient.  Encouraged proper weight management.  Discussed driving precautions and its relationship with hypersomnolence.  Discussed sleep hygiene, and benefits of a fixed sleep waked time.  The importance of getting eight or more hours of sleep discussed with patient.  Discussed limiting the use of the computer and television before bedtime.  Decrease naps during the day, so night time sleep will become enhanced.  Limit caffeine, and sleep deprivation.   Patient uses and benefits from therapy Using CPAP nightly and with naps Pressure setting is comfortable and is sleeping well.   Current download shows excellent compliance Patient has 100% for days 100% for greater than 4 hours with AHI reduced to 2.2 Patient uses and benefits from therapy Advised to use it during his nap time as well  No evidence of heart failure at this time No evidence or signs of infection at this time No respiratory distress No fevers, chills, nausea, vomiting, diarrhea No evidence of lower extremity edema No evidence hemoptysis    PAST MEDICAL HISTORY :   has a past medical history of Abnormal ECG (12/19/2020), Arthritis, Benign essential hypertension (12/19/2020), Bilateral sciatica (10/07/2013), Bipolar disorder II, in full remission, most recent episode depressed (01/21/2022), Circadian rhythm sleep disorder, 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), Elevated hemoglobin A1c (08/07/2021), Excessive drinking of alcohol (01/01/2023), Generalized anxiety disorder (06/16/2017), GERD (gastroesophageal reflux disease), History of hernia repair (05/14/2012), History of kidney stones,  Hyperlipidemia, mixed (12/19/2020), Hyponatremia (10/08/2013), Hypothyroidism, 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  has a past surgical history that includes REPAIR RIGHT INGUINAL HERNIA W/ MESH (02-14-2006); Hammer toe surgery (2012  &  2010  (APPROX)); Cataract extraction w/ intraocular lens implant (2011 (APPROX)); Ureteroscopy (08/12/2012); Knee surgery (Left); Eye surgery; Hernia repair (Bilateral); Joint replacement (Left, 2014); Septoplasty (N/A, 09/27/2015); Nasal turbinate reduction (Bilateral, 09/27/2015); Cystoscopy with retrograde pyelogram, ureteroscopy and stent placement (Bilateral, 02/23/2021); and Holmium laser application (Bilateral, 02/23/2021). Prior to Admission medications   Medication Sig Start Date End Date Taking? Authorizing Provider  Ascorbic Acid (VITAMIN C) 1000 MG tablet Take 1,000 mg by mouth daily.    [provider]  b complex vitamins capsule Take 1 capsule by mouth daily.    [provider]  buPROPion  (WELLBUTRIN  XL) 300 MG 24 hr tablet Take 1 tablet (300 mg total) by mouth daily. Stop Wellbutrin  SR 200 MG 04/01/23   Eappen, Saramma, MD  Coenzyme Q10 (COQ10 PO) Take 60 mg by mouth daily.    [provider]  ergocalciferol (VITAMIN D2) 1.25 MG (50000 UT) capsule Take 50,000 Units by mouth 2 (two) times a week.    [provider]  eszopiclone  (LUNESTA ) 2 MG TABS tablet Take 2 mg by mouth at bedtime as needed for sleep. Take immediately before bedtime    [provider]  lamoTRIgine  (LAMICTAL ) 100 MG tablet Take 1  tablet (100 mg total) by mouth daily. 04/01/23   Eappen, Saramma,  MD  levothyroxine (SYNTHROID, LEVOTHROID) 25 MCG tablet Take 25 mcg by mouth Daily.  03/19/12   [provider]  lithium  carbonate (ESKALITH ) 450 MG ER tablet Take 1 tablet (450 mg total) by mouth 2 (two) times daily. Dose change 11/26/22   Eappen, Saramma, MD  magnesium oxide (MAG-OX) 400 MG tablet Take by mouth.    [provider]  Menaquinone-7 (VITAMIN K2) 100 MCG CAPS Take 1 capsule by mouth daily.    [provider]  Misc Natural Products (PROSTATE HEALTH PO) Take 1 capsule by mouth daily.    [provider]  Multiple Vitamins-Minerals (OCUVITE PRESERVISION PO) Take 1 tablet by mouth daily.    [provider]  Omega-3 Fatty Acids (FISH OIL) 1000 MG CAPS Take 1,000 mg by mouth daily.    [provider]  OVER THE COUNTER MEDICATION Take 1 capsule by mouth daily. Adrenogen    [provider]  potassium citrate (UROCIT-K) 10 MEQ (1080 MG) SR tablet 1 tablet with meals 12/23/12   [provider]  Probiotic Product (PROBIOTIC DAILY PO) Take 1 capsule by mouth daily.    [provider]  Quercetin 500 MG CAPS Take 1 capsule by mouth daily.    [provider]  RESVERATROL PO Take 500 mg by mouth daily.    [provider]  S-Adenosylmethionine (SAME) 400 MG TABS Take 400 mg by mouth daily.    [provider]  senna-docusate (SENOKOT-S) 8.6-50 MG tablet Take 1 tablet by mouth 2 (two) times daily. While taking strongest pain meds to prevent constipation. 02/23/21   Alvaro Ricardo KATHEE Mickey., MD  sodium fluoride (FLUORISHIELD) 1.1 % GEL dental gel Place onto teeth. 02/24/19   [provider]  telmisartan (MICARDIS) 20 MG tablet Take 40 mg by mouth daily. 10/15/22   [provider]  Tryptophan 500 MG CAPS Take 1 capsule by mouth at bedtime.    [provider]  zaleplon  (SONATA ) 10 MG capsule Take 1 capsule (10 mg total) by  mouth at bedtime as needed for sleep. 02/13/23 05/14/23  Eappen, Saramma, MD   Allergies  Allergen Reactions   Bee Venom Anaphylaxis    Has EPI pen for this   Cat Dander Other (See Comments)    Per pt his nose runs/congestion.    Clonidine Other (See Comments)    fatigue fatigue fatigue   Grass Pollen(K-O-R-T-Swt Vern) Other (See Comments)    Runny nose   Other Other (See Comments)    Per pt his nose runs/congestion.    FAMILY HISTORY:  family history includes Dementia in his mother; Heart attack in his father; Parkinson's disease in his maternal aunt; Suicidality in his brother and sister. SOCIAL HISTORY:  reports that he has never smoked. He has never used smokeless tobacco. He reports current alcohol use of about 28.0 standard drinks of alcohol per week. He reports that he does not use drugs.   BP 118/80 (BP Location: Left Arm, Patient Position: Sitting, Cuff Size: Normal)   Pulse 85   Temp 98.1 F (36.7 C) (Oral)   Ht 5' 7 (1.702 m)   Wt 174 lb 6.4 oz (79.1 kg)   SpO2 97%   BMI 27.31 kg/m      Review of Systems: Gen:  Denies  fever, sweats, chills weight loss  HEENT: Denies blurred vision, double vision, ear pain, eye pain, hearing loss, nose bleeds, sore throat Cardiac:  No dizziness, chest pain or heaviness, chest tightness,edema, No JVD Resp:  No cough, -sputum production, -shortness of breath,-wheezing, -hemoptysis,  Other:  All other systems negative   Physical Examination:   General Appearance: No distress  EYES PERRLA, EOM intact.   NECK Supple, No JVD Pulmonary: normal breath sounds, No wheezing.  CardiovascularNormal S1,S2.  No m/r/g.   Abdomen: Benign, Soft, non-tender. Neurology UE/LE 5/5 strength, no focal deficits Ext pulses intact, cap refill intact ALL OTHER ROS ARE NEGATIVE    ASSESSMENT AND PLAN SYNOPSIS 78 year old male seen today for follow-up assessment for  sleep apnea with AHI of 22   Assessment of OSA Previous AHI  22 Continue CPAP as prescribed  Excellent compliance report Reviewed compliance report in detail with patient Patient definitely benefits the use of CPAP therapy as prescribed Using CPAP nightly and with naps Pressure setting is comfortable and is sleeping well. CPAP prescription V Auto 6-25cm h20 with 4cm h20 pressure support AHI reduced to 2.2  No evidence of acute heart failure at this time No respiratory distress No fevers, chills, nausea, vomiting, diarrhea No evidence hemoptysis  Patient Instructions Continue to use CPAP every night, minimum of 4-6 hours a night.  Change equipment every 30 days or as directed by DME.  Wash your tubing with warm soap and water daily, hang to dry. Wash humidifier portion weekly. Use bottled, distilled water and change daily   Be aware of reduced alertness and do not drive or operate heavy machinery if experiencing this or drowsiness.  Exercise encouraged, as tolerated. Encouraged proper weight management.  Important to get eight or more hours of sleep  Limiting the use of the computer and television before bedtime.  Decrease naps during the day, so night time sleep will become enhanced.  Limit caffeine, and sleep deprivation.  HTN, stroke, uncontrolled diabetes and heart failure are potential risk factors.  Risk of untreated sleep apnea including cardiac arrhthymias, stroke, DM, pulm HTN.  Full face mask Patient uses and benefits from therapy  Deconditioned state -Recommend increased daily activity and exercise    MEDICATION ADJUSTMENTS/LABS AND TESTS ORDERED: Continue CPAP as prescribed Contact DME company on details of possible new machine and bigger reservoir for distilled water Avoid Allergens and Irritants Avoid secondhand smoke Avoid SICK contacts Recommend  Masking  when appropriate Recommend Keep up-to-date with vaccinations    CURRENT MEDICATIONS REVIEWED AT LENGTH WITH PATIENT TODAY   Patient  satisfied with Plan of  action and management. All questions answered   Follow up 6 months   I spent a total of 42 minutes reviewing chart data, face-to-face evaluation with the patient, counseling and coordination of care as detailed above.      Nickolas Alm Cellar, M.D.  [facility]  Medical Director [facility] Medical Director [facility] Cardio-Pulmonary Department

## 2024-05-19 NOTE — Patient Instructions (Addendum)
 Excellent Job A+ GOLD STAR!!  Continue CPAP as prescribed  Patient Instructions Continue to use CPAP every night, minimum of 4-6 hours a night.  Change equipment every 30 days or as directed by DME.  Wash your tubing with warm soap and water daily, hang to dry. Wash humidifier portion weekly. Use bottled, distilled water and change daily   Be aware of reduced alertness and do not drive or operate heavy machinery if experiencing this or drowsiness.  Exercise encouraged, as tolerated. Encouraged proper weight management.  Important to get eight or more hours of sleep  Limiting the use of the computer and television before bedtime.  Decrease naps during the day, so night time sleep will become enhanced.  Limit caffeine, and sleep deprivation.    Avoid Allergens and Irritants Avoid secondhand smoke Avoid SICK contacts Recommend  Masking  when appropriate Recommend Keep up-to-date with vaccinations  Call [employer] to find out more info on current CPAP machine

## 2024-05-21 ENCOUNTER — Ambulatory Visit
Admission: RE | Admit: 2024-05-21 | Discharge: 2024-05-21 | Disposition: A | Discharge status: Home / Self Care | Source: Ambulatory Visit | Attending: Podiatry | Admitting: Podiatry

## 2024-05-21 ENCOUNTER — Ambulatory Visit
Admission: RE | Admit: 2024-05-21 | Discharge: 2024-05-21 | Disposition: A | Discharge status: Home / Self Care | Source: Ambulatory Visit | Attending: Podiatry

## 2024-05-21 DIAGNOSIS — M19071 Primary osteoarthritis, right ankle and foot: Secondary | ICD-10-CM

## 2024-05-21 DIAGNOSIS — M19072 Primary osteoarthritis, left ankle and foot: Secondary | ICD-10-CM

## 2024-05-27 LAB — LITHIUM LEVEL: Lithium Lvl: 0.6 mmol/L (ref 0.5–1.2)

## 2024-06-14 ENCOUNTER — Ambulatory Visit (INDEPENDENT_AMBULATORY_CARE_PROVIDER_SITE_OTHER): Admitting: Podiatry

## 2024-06-14 VITALS — Ht 67.0 in | Wt 174.0 lb

## 2024-06-14 DIAGNOSIS — E559 Vitamin D deficiency, unspecified: Secondary | ICD-10-CM

## 2024-06-14 DIAGNOSIS — M7741 Metatarsalgia, right foot: Secondary | ICD-10-CM

## 2024-06-14 DIAGNOSIS — M2042 Other hammer toe(s) (acquired), left foot: Secondary | ICD-10-CM | POA: Diagnosis not present

## 2024-06-14 DIAGNOSIS — M19072 Primary osteoarthritis, left ankle and foot: Secondary | ICD-10-CM

## 2024-06-14 DIAGNOSIS — M7742 Metatarsalgia, left foot: Secondary | ICD-10-CM

## 2024-06-14 NOTE — Progress Notes (Signed)
 Subjective:  Patient ID: Adrian Lewis, male    DOB: December 17, 1945,  MRN: [id number]  Chief Complaint  Patient presents with   Surgery Scheduling    RM 8 Patient is here CT results surgery planning.    Discussed the use of AI scribe software for clinical note transcription with the patient, who gave verbal consent to proceed.  History of Present Illness Adrian Lewis is a 78 year old male who presents with severe neuropathy and arthritis in his feet. He was referred by Dr. Verta for evaluation of his foot pain and neuropathy.  He has been experiencing severe neuropathy and arthritis in his feet for about a year, starting around April or May of the previous year. The pain is constant, especially when warm, and limits his ability to walk, affecting his gait and balance. The pain is primarily in the balls of his feet, with the neuropathy being intermittent but intense.  He has tried various treatments for neuropathy without much success. Cortisone shots received last week did not help much. He also underwent scrambler therapy for twelve sessions, which initially provided some relief but the symptoms returned. The therapy is expensive and not covered by insurance, making it difficult to continue.  He denies having diabetes and has no history of heart attacks or strokes. 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  He does not take any pain medication and has concerns about the progression of his condition. The arthritis pain is currently worse than the neuropathy. He has had bones fused in his left foot previously, which was successful but required a difficult recovery period of non-weight bearing for six weeks.    Interval history: He completed the CT scan.  No change in  symptoms.  Objective:    Physical Exam VASCULAR: DP and PT pulses palpable. Foot is warm and well-perfused. Capillary fill time is brisk. DERMATOLOGIC: Normal skin turgor, texture, and temperature. No open lesions, rashes, or ulcerations. NEUROLOGIC: Decreased peripheral sensation in feet. No paresthesias on examination. ORTHOPEDIC: Pain and swelling in the midfoot and forefoot on the MTPJs with tender metatarsal heads. Digital contractures are semi-reducible with severe contractures on the right foot.  Palpable spurring and tenderness in the dorsal midfoot first TMT joint   No images are attached to the encounter.    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 Metatarsalgia of both feet   2. Vitamin D  deficiency   3. Arthritis of midtarsal joint of left foot   4. Hammertoe of left foot      Plan:  Patient was evaluated and treated and all questions answered.  Assessment and Plan Assessment & Plan Severe osteoarthritis of the foot, metatarsalgia, hammertoes We reviewed the results of the CT scan.  We discussed his symptoms and again discussed that his neuropathic pain and symptoms are likely not to improve, however there are surgical options for his arthritic changes and metatarsalgia.  Surgically we discussed first TMT first MPJ fusions, pan metatarsal head resection and hammertoe corrections of the 2nd, 3rd, 4th and 5th  hammertoes and metatarsals.  We discussed the risk benefits and potential complications including but not limited to  pain, swelling, infection, scar, numbness which may be temporary or permanent, chronic pain, stiffness, nerve pain or damage, wound healing problems, bone healing problems including delayed or  non-union.  We discussed the including initial nonweightbearing.  In a splint for 2 weeks followed by nonweightbearing in a boot for 4 weeks at which point after the pins are removed he will begin weightbearing gradually.  I expect 10 to 12 weeks for full recovery and advised not proceeding with right foot surgery until 4 months after the first surgery.  Recommended checking vitamin D  level as well and order was placed for this.  Will need H&P and clearance from his PCP and cardiologist.  All questions addressed.  Informed consent signed reviewed.     Surgical plan:  Procedure: - Left first MTP, TMT arthrodesis, metatarsal head resection 2, 3, 4, 5, hammertoe correction 2, 3, 4, 5  Location: - [facility]  Anesthesia plan: - General With regional block  Postoperative pain plan: - Tylenol  1000 mg every 6 hours,   oxycodone  5 mg 1-2 tabs every 6 hours only as needed  DVT prophylaxis: - Xarelto 10 mg nightly  WB Restrictions / DME needs: - Nonweightbearing in splint postop    No follow-ups on file.

## 2024-06-15 LAB — VITAMIN D 25 HYDROXY (VIT D DEFICIENCY, FRACTURES): Vit D, 25-Hydroxy: 47 ng/mL (ref 30.0–100.0)

## 2024-06-15 LAB — LITHIUM LEVEL: Lithium Lvl: 0.9 mmol/L (ref 0.5–1.2)

## 2024-06-21 ENCOUNTER — Telehealth: Admitting: Psychiatry

## 2024-06-21 ENCOUNTER — Ambulatory Visit: Payer: Self-pay | Admitting: Podiatry

## 2024-06-22 ENCOUNTER — Telehealth: Payer: Self-pay | Admitting: Podiatry

## 2024-06-22 NOTE — Telephone Encounter (Signed)
 Received surgical consent forms  Called pt and he is not ready to schedule the surgery, he is still wanting to think about it and will call us  back to get scheduled.

## 2024-06-25 ENCOUNTER — Telehealth: Admitting: Psychiatry

## 2024-07-14 ENCOUNTER — Ambulatory Visit (INDEPENDENT_AMBULATORY_CARE_PROVIDER_SITE_OTHER): Admitting: Urology

## 2024-07-14 ENCOUNTER — Encounter: Payer: Self-pay | Admitting: Urology

## 2024-07-14 VITALS — BP 126/71 | HR 73 | Ht 67.0 in | Wt 170.0 lb

## 2024-07-14 DIAGNOSIS — R399 Unspecified symptoms and signs involving the genitourinary system: Secondary | ICD-10-CM

## 2024-07-14 DIAGNOSIS — N200 Calculus of kidney: Secondary | ICD-10-CM

## 2024-07-14 DIAGNOSIS — N529 Male erectile dysfunction, unspecified: Secondary | ICD-10-CM

## 2024-07-14 DIAGNOSIS — N401 Enlarged prostate with lower urinary tract symptoms: Secondary | ICD-10-CM | POA: Diagnosis not present

## 2024-07-14 DIAGNOSIS — R3912 Poor urinary stream: Secondary | ICD-10-CM

## 2024-07-14 DIAGNOSIS — R972 Elevated prostate specific antigen [PSA]: Secondary | ICD-10-CM | POA: Diagnosis not present

## 2024-07-14 LAB — URINALYSIS, COMPLETE
Bilirubin, UA: NEGATIVE
Glucose, UA: NEGATIVE
Ketones, UA: NEGATIVE
Nitrite, UA: NEGATIVE
Protein,UA: NEGATIVE
RBC, UA: NEGATIVE
Specific Gravity, UA: 1.01 (ref 1.005–1.030)
Urobilinogen, Ur: 0.2 mg/dL (ref 0.2–1.0)
pH, UA: 7 (ref 5.0–7.5)

## 2024-07-14 LAB — MICROSCOPIC EXAMINATION: Bacteria, UA: NONE SEEN

## 2024-07-14 MED ORDER — TAMSULOSIN HCL 0.4 MG PO CAPS: 0.4000 mg | ORAL_CAPSULE | Freq: Every day | ORAL | 11 refills | Status: DC

## 2024-07-14 NOTE — Progress Notes (Signed)
 07/14/24 11:17 AM   Adrian Lewis Adrian Lewis November 22, 1946 [id number]  CC: Elevated PSA, nephrolithiasis, urinary symptoms, ED  HPI: 78 year old male transferring his care from Dr. Alvaro at [facility] urology secondary to ease of location and travel.  He has been followed by them long-term for recurrent nephrolithiasis, most recent ureteroscopy was in 2022.  He has a number of concerns today.  Recent PSA from May 2025 was 8.13, increased from 5 in September 2022, and 3.4 in January 2020.  He states he be adamantly opposed to any type of surgery or radiation if he did have prostate cancer.  He has done well from a kidney stone perspective, takes potassium citrate for prevention.  He also has bothersome urinary symptoms with weak stream, postvoid dribbling, has had some improvement over the last few weeks with over-the-counter supplements like lycopene and saw palmetto.  He denies any dysuria or gross hematuria.  He has also had long-term problems with ED, not interested in further treatment at this time.  Urinalysis today is benign, PVR is normal at .   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   Elevated hemoglobin A1c 08/07/2021   Excessive drinking of alcohol 01/01/2023   01/01/23 - reported 4 hard ciders (4.5% ABV) nightly   Generalized anxiety disorder 06/16/2017   GERD (gastroesophageal reflux disease)    History of hernia repair 05/14/2012   History of kidney stones    Hyperlipidemia, mixed 12/19/2020   Hyponatremia 10/08/2013   Hypothyroidism    Insomnia due to medical condition 06/16/2017   Macular degeneration    Major depressive disorder    Medication overdose 06/16/2017   Mild cognitive  impairment of uncertain or unknown etiology 01/01/2023   Nephrolithiasis 01/25/2020   Obstructive sleep apnea 10/07/2013   Osteopenia 04/04/2014   Paresthesia of both feet 12/03/2022   Peripheral neuropathy    in feet   Radiculopathy 02/24/2013   Rising PSA level 08/07/2021   Shortness of breath on exertion 12/19/2020   Tremor 03/19/2022    Surgical History: Past Surgical History:  Procedure Laterality Date   CATARACT EXTRACTION W/ INTRAOCULAR LENS IMPLANT  2011 (APPROX)   RIGHT EYE AND REPAIR DETACHED RETINA   CYSTOSCOPY WITH RETROGRADE PYELOGRAM, URETEROSCOPY AND STENT PLACEMENT Bilateral 02/23/2021   Procedure: CYSTOSCOPY WITH RETROGRADE PYELOGRAM, URETEROSCOPY AND STENT PLACEMENT;  Surgeon: Alvaro Hummer, MD;  Location: [facility];  Service: Urology;  Laterality: Bilateral;  75 MINS   EYE SURGERY     goiter removal  06/25/2023   HAMMER TOE SURGERY  2012  &  2010  (APPROX)   ONE TOE , EACH FOOT   HERNIA REPAIR Bilateral    Inguinal Hernia Repair   HOLMIUM LASER APPLICATION Bilateral 02/23/2021   Procedure: HOLMIUM LASER APPLICATION;  Surgeon: Alvaro Hummer, MD;  Location: [facility];  Service: Urology;  Laterality: Bilateral;   JOINT REPLACEMENT Left 2014   Partial Knee Replacement   KNEE SURGERY Left    x 2  partial and reconstructive   NASAL TURBINATE REDUCTION Bilateral 09/27/2015   Procedure: TURBINATE REDUCTION/SUBMUCOSAL RESECTION;  Surgeon: Deward Dolly, MD;  Location: [facility];  Service: ENT;  Laterality: Bilateral;   REPAIR RIGHT INGUINAL HERNIA W/ MESH  02/14/2006  SEPTOPLASTY N/A 09/27/2015   Procedure: SEPTOPLASTY;  Surgeon: Deward Dolly, MD;  Location: [facility];  Service: ENT;  Laterality: N/A;   URETEROSCOPY  08/12/2012   Procedure: URETEROSCOPY;  Surgeon: Ricardo Likens, MD;  Location: [facility];  Service: Urology;  Laterality: Left;  1 HR     Family History: Family History  Problem Relation Age of Onset   Dementia Mother        with  advanced age; ~21s   Heart attack Father    Suicidality Sister    Suicidality Brother    Parkinson's disease Maternal Aunt     Social History:  reports that he has never smoked. He has never used smokeless tobacco. He reports current alcohol use of about 28.0 standard drinks of alcohol per week. He reports that he does not use drugs.  Physical Exam: BP 126/71 (BP Location: Left Arm, Patient Position: Sitting, Cuff Size: Large)   Pulse 73   Ht 5' 7 (1.702 m)   Wt 170 lb (77.1 kg)   BMI 26.63 kg/m    Constitutional:  Alert and oriented, No acute distress. Cardiovascular: No clubbing, cyanosis, or edema. Respiratory: Normal respiratory effort, no increased work of breathing. GI: Abdomen is soft, nontender, nondistended, no abdominal masses   Laboratory Data: Reviewed, see HPI   Assessment & Plan:   78 year old male [facility] for a number of issues including nephrolithiasis, new urinary symptoms, and new elevated PSA.  I reviewed the outside notes from [facility] urology extensively.  In terms of nephrolithiasis, continue potassium citrate, prevention strategies discussed, recommended stopping vitamin C supplementation.  Can continue yearly KUB.  Regarding PSA screening, we reviewed the risks and benefits of screening and that routine screening is not recommended in men over age 47.  Since he is opposed to any type of intervention even if he did have prostate cancer, he preferred more of a watchful waiting approach with a repeat PSA with PCP in 1 year.  I also offered alternative options like PSA reflex to free or prostate health index score.   Finally, in terms of urinary symptoms, he is bothered enough to try medications at this point.  Risks and benefits of Flomax  were discussed, and medication was sent in.  Can continue over-the-counter supplementation as well, discussed avoiding bladder irritants.  Trial of Flomax  for urinary symptoms RTC 2  months IPSS, PVR Patient prefers repeat PSA next year with PCP for more of a watchful waiting approach regarding elevated PSA   Redell Burnet, MD 07/14/2024  [facility] [address] [place], [place] [address] [phone]

## 2024-07-15 ENCOUNTER — Other Ambulatory Visit: Payer: Self-pay | Admitting: Psychiatry

## 2024-07-16 ENCOUNTER — Ambulatory Visit: Payer: Self-pay | Admitting: Psychiatry

## 2024-07-16 LAB — LITHIUM LEVEL: Lithium Lvl: 0.7 mmol/L (ref 0.5–1.2)

## 2024-07-19 ENCOUNTER — Other Ambulatory Visit: Payer: Self-pay | Admitting: Otolaryngology

## 2024-07-19 DIAGNOSIS — R633 Feeding difficulties, unspecified: Secondary | ICD-10-CM

## 2024-07-19 DIAGNOSIS — R1310 Dysphagia, unspecified: Secondary | ICD-10-CM

## 2024-07-19 LAB — CULTURE, URINE COMPREHENSIVE

## 2024-07-27 ENCOUNTER — Encounter: Payer: Self-pay | Admitting: Psychiatry

## 2024-07-27 ENCOUNTER — Telehealth (INDEPENDENT_AMBULATORY_CARE_PROVIDER_SITE_OTHER): Admitting: Psychiatry

## 2024-07-27 ENCOUNTER — Telehealth: Payer: Self-pay

## 2024-07-27 DIAGNOSIS — G4701 Insomnia due to medical condition: Secondary | ICD-10-CM

## 2024-07-27 DIAGNOSIS — R52 Pain, unspecified: Secondary | ICD-10-CM

## 2024-07-27 DIAGNOSIS — G4733 Obstructive sleep apnea (adult) (pediatric): Secondary | ICD-10-CM

## 2024-07-27 DIAGNOSIS — F3181 Bipolar II disorder: Secondary | ICD-10-CM | POA: Diagnosis not present

## 2024-07-27 DIAGNOSIS — F09 Unspecified mental disorder due to known physiological condition: Secondary | ICD-10-CM

## 2024-07-27 DIAGNOSIS — Z79899 Other long term (current) drug therapy: Secondary | ICD-10-CM

## 2024-07-27 DIAGNOSIS — F3176 Bipolar disorder, in full remission, most recent episode depressed: Secondary | ICD-10-CM

## 2024-07-27 NOTE — Telephone Encounter (Signed)
 Spoke to patients wife informed her of the standing orders for [facility] she stated that she would be by the office to pick the orders up on Wednesday 07-28-24

## 2024-07-27 NOTE — Progress Notes (Unsigned)
 Virtual Visit via Video Note  I connected with Adrian Lewis on 07/27/24 at 10:30 AM EDT by a video enabled telemedicine application and verified that I am speaking with the correct person using two identifiers.  Location Provider Location : [facility] Patient Location : Home  Participants: Patient , Provider   I discussed the limitations of evaluation and management by telemedicine and the availability of in person appointments. The patient expressed understanding and agreed to proceed.   I discussed the assessment and treatment plan with the patient. The patient was provided an opportunity to ask questions and all were answered. The patient agreed with the plan and demonstrated an understanding of the instructions.   The patient was advised to call back or seek an in-person evaluation if the symptoms worsen or if the condition fails to improve as anticipated.  [facility] MD OP Progress Note  07/27/2024 10:44 AM Adrian Lewis  MRN:  [id number]  Chief Complaint:  Chief Complaint  Patient presents with   Follow-up   Depression   Medication Refill   Discussed the use of AI scribe software for clinical note transcription with the patient, who gave verbal consent to proceed.  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  He reports persistent daytime tiredness and sleepiness, primarily attributing these symptoms to his blood pressure medication, telmisartan. Over the past year, he has noticed a progressive increase in fatigue, with less daytime energy than previously. Due to this fatigue, he typically takes a nap for about 1 hour after lunch.   He continues to have ongoing  difficulty with sleep initiation, as he describes himself as nocturnal and often does not fall asleep until midnight or later, sometimes as late as 1:15 AM. Once asleep, he sleeps soundly for 6 to 7 hours and tends to sleep late into the morning. When he is unable to fall asleep, he gets up to read. He expresses a desire to be more awake during the day and to fall asleep earlier at night but has not found a solution for this reversal of his circadian rhythm.  He has sonata  available as needed and would like to continue this medication.  He continues to use lithium , with recent levels in the therapeutic range (0.7), and finds lithium  to be a very good medication for him. He also confirms ongoing use of Wellbutrin , Lamictal , Synthroid. He reports that he is taking all his medications as prescribed and has not experienced any new side effects or changes in medications from other doctors since the last visit.  He denies any thoughts of hurting himself or others. As he contemplates the later stage of his life, he shares that he has become more spiritually aware, which he finds consoling and encouraging, and states that he would never consider self-harm or harming others.  He continues to have some ongoing short-term memory difficulties, but he does not feel these have worsened. To keep track of appointments, he uses a schedule book and states that he does not miss appointments.   Lives with spouse. [place] and spends 4 to 6 hours outdoors daily.    Visit Diagnosis:    ICD-10-CM   1. Bipolar disorder, in full remission, most recent episode depressed (HCC)  F31.76  Lithium  level    Lithium  level    Lithium  level   type 2    2. Insomnia due to medical condition  G47.01    OSA on CPAP bipolar disorder, pain    3. Mild cognitive disorder  F09     4. High risk medication use  Z79.899 Lithium  level    Lithium  level    Lithium  level    BUN+Creat    CANCELED: Lithium  level       Past Psychiatric History: I have reviewed past psychiatric history from progress note on 01/21/2022.  Past trials of medications like Lunesta , Sonata , Belsomra -cost, Wellbutrin  XL-did not tolerate higher dosage of 300 mg.  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   Elevated hemoglobin A1c 08/07/2021   Excessive drinking of alcohol 01/01/2023   01/01/23 - reported 4 hard ciders (4.5% ABV) nightly   Generalized anxiety disorder 06/16/2017   GERD (gastroesophageal reflux disease)    History of hernia repair 05/14/2012   History of kidney stones    Hyperlipidemia, mixed 12/19/2020   Hyponatremia 10/08/2013   Hypothyroidism    Insomnia due to medical condition 06/16/2017   Macular degeneration    Major depressive disorder    Medication overdose 06/16/2017   Mild cognitive impairment of uncertain or unknown etiology 01/01/2023   Nephrolithiasis 01/25/2020   Obstructive sleep apnea 10/07/2013   Osteopenia 04/04/2014   Paresthesia of both feet 12/03/2022   Peripheral neuropathy    in feet   Radiculopathy 02/24/2013   Rising PSA level 08/07/2021   Shortness of breath on exertion 12/19/2020   Tremor 03/19/2022    Past Surgical History:  Procedure Laterality Date   CATARACT EXTRACTION W/ INTRAOCULAR LENS IMPLANT  2011 (APPROX)   RIGHT EYE AND REPAIR DETACHED RETINA   CYSTOSCOPY WITH RETROGRADE PYELOGRAM, URETEROSCOPY AND STENT PLACEMENT Bilateral 02/23/2021   Procedure: CYSTOSCOPY WITH RETROGRADE PYELOGRAM, URETEROSCOPY AND STENT PLACEMENT;  Surgeon: Alvaro Hummer, MD;  Location: [facility];  Service: Urology;  Laterality: Bilateral;  75 MINS   EYE SURGERY     goiter  removal  06/25/2023   HAMMER TOE SURGERY  2012  &  2010  (APPROX)   ONE TOE , EACH FOOT   HERNIA REPAIR Bilateral    Inguinal Hernia Repair   HOLMIUM LASER APPLICATION Bilateral 02/23/2021   Procedure: HOLMIUM LASER APPLICATION;  Surgeon: Alvaro Hummer, MD;  Location: [facility];  Service: Urology;  Laterality: Bilateral;   JOINT REPLACEMENT Left 2014   Partial Knee Replacement   KNEE SURGERY Left    x 2  partial and reconstructive   NASAL TURBINATE REDUCTION Bilateral 09/27/2015   Procedure: TURBINATE REDUCTION/SUBMUCOSAL RESECTION;  Surgeon: Deward Dolly, MD;  Location: [facility];  Service: ENT;  Laterality: Bilateral;   REPAIR RIGHT INGUINAL HERNIA W/ MESH  02/14/2006   SEPTOPLASTY N/A 09/27/2015   Procedure: SEPTOPLASTY;  Surgeon: Deward Dolly, MD;  Location: [facility];  Service: ENT;  Laterality: N/A;   URETEROSCOPY  08/12/2012   Procedure: URETEROSCOPY;  Surgeon: Hummer Alvaro, MD;  Location: [facility];  Service: Urology;  Laterality: Left;  1 HR     Family Psychiatric History: I have reviewed family psychiatric history  from progress note on 01/22/2024.  Family History:  Family History  Problem Relation Age of Onset   Dementia Mother        with advanced age; ~74s   Heart attack Father    Suicidality Sister    Suicidality Brother    Parkinson's disease Maternal Aunt     Social History: I have reviewed social history from progress note on 01/21/2022. Social History   Socioeconomic History   Marital status: Married    Spouse name: Not on file   Number of children: 2   Years of education: 20   Highest education level: Doctorate  Occupational History   Occupation: Retired    Comment: English professor  Tobacco Use   Smoking status: Never   Smokeless tobacco: Never  Vaping Use   Vaping status: Never Used  Substance and Sexual Activity   Alcohol use: Yes    Alcohol/week: 28.0 standard drinks of alcohol    Types: 28 Standard drinks or equivalent per  week    Comment: 4 hard ciders nightly   Drug use: No   Sexual activity: Not Currently  Other Topics Concern   Not on file  Social History Narrative   Right handed   Drinks caffeine   Two story home   Lives with wife in the home   retired   Social Drivers of Corporate investment banker Strain: Patient Declined (12/11/2023)   Received [facility]   Overall Financial Resource Strain (CARDIA)    Difficulty of Paying Living Expenses: Patient declined  Food Insecurity: Patient Declined (12/11/2023)   [facility]   Hunger Vital Sign    Within the past 12 months, you worried that your food would run out before you got the money to buy more.: Patient declined    Within the past 12 months, the food you bought just didn't last and you didn't have money to get more.: Patient declined  Transportation Needs: Patient Declined (12/11/2023)   Received from [facility] - Transportation    In the past 12 months, has lack of transportation kept you from medical appointments or from getting medications?: Patient declined    Lack of Transportation (Non-Medical): Patient declined  Physical Activity: Not on file  Stress: Not on file  Social Connections: Not on file    Allergies:  Allergies  Allergen Reactions   Bee Venom Anaphylaxis    Has EPI pen for this   Cat Dander Other (See Comments)    Per pt his nose runs/congestion.    Clonidine Other (See Comments)    fatigue fatigue fatigue   Grass Pollen(K-O-R-T-Swt Vern) Other (See Comments)    Runny nose   Other Other (See Comments)    Per pt his nose runs/congestion.    Metabolic Disorder Labs: No results found for: HGBA1C, MPG No results found for: PROLACTIN No results found for: CHOL, TRIG, HDL, CHOLHDL, VLDL, LDLCALC Lab Results  Component Value Date   TSH 1.800 11/11/2023   TSH 1.310 11/06/2022    Therapeutic Level Labs: Lab  Results  Component Value Date   LITHIUM  0.7 07/15/2024   LITHIUM  0.9 06/14/2024   No results found for: VALPROATE No results found for: CBMZ  Current Medications: Current Outpatient Medications  Medication Sig Dispense Refill   acetaminophen  (TYLENOL ) 500 MG tablet Take by mouth.     acetaminophen -codeine (TYLENOL  #3) 300-30 MG tablet Take 1 tablet by mouth every 6 (six)  hours as needed.     Ascorbic Acid (VITAMIN C) 1000 MG tablet Take 1,000 mg by mouth daily.     atorvastatin (LIPITOR) 20 MG tablet Take 20 mg by mouth daily.     b complex vitamins capsule Take 1 capsule by mouth daily.     buPROPion  (WELLBUTRIN  SR) 200 MG 12 hr tablet Take 1 tablet (200 mg total) by mouth in the morning. 90 tablet 1   chlorhexidine  (PERIDEX ) 0.12 % solution      Coenzyme Q10 (COQ10 PO) Take 60 mg by mouth daily.     cyanocobalamin (VITAMIN B12) 1000 MCG tablet Take by mouth.     ergocalciferol  (VITAMIN D2) 1.25 MG (50000 UT) capsule Take 50,000 Units by mouth 2 (two) times a week.     ipratropium (ATROVENT) 0.03 % nasal spray Place into both nostrils.     lamoTRIgine  (LAMICTAL ) 100 MG tablet Take 1 tablet (100 mg total) by mouth daily.     levothyroxine (SYNTHROID, LEVOTHROID) 25 MCG tablet Take 25 mcg by mouth Daily.      lithium  carbonate (ESKALITH ) 450 MG ER tablet Take 2 tablets (900 mg total) by mouth daily. 180 tablet 1   magnesium oxide (MAG-OX) 400 MG tablet Take by mouth.     Menaquinone-7 (VITAMIN K2) 100 MCG CAPS Take 1 capsule by mouth daily.     Misc Natural Products (PROSTATE HEALTH PO) Take 1 capsule by mouth daily.     Multiple Vitamins-Minerals (OCUVITE PRESERVISION PO) Take 1 tablet by mouth daily.     Omega-3 Fatty Acids (FISH OIL) 1000 MG CAPS Take 1,000 mg by mouth daily.     OVER THE COUNTER MEDICATION Take 1 capsule by mouth daily. Adrenogen     potassium citrate (UROCIT-K) 10 MEQ (1080 MG) SR tablet 1 tablet with meals     PREVIDENT 5000 PLUS 1.1 % CREA dental cream Take  by mouth.     Probiotic Product (PROBIOTIC DAILY PO) Take 1 capsule by mouth daily.     Quercetin 500 MG CAPS Take 1 capsule by mouth daily.     RESVERATROL PO Take 500 mg by mouth daily.     S-Adenosylmethionine (SAME) 400 MG TABS Take 400 mg by mouth daily.     Saw Palmetto, Serenoa repens, (SAW PALMETTO PO) Take by mouth.     senna-docusate (SENOKOT-S) 8.6-50 MG tablet Take 1 tablet by mouth 2 (two) times daily. While taking strongest pain meds to prevent constipation. 10 tablet 0   tamsulosin  (FLOMAX ) 0.4 MG CAPS capsule Take 1 capsule (0.4 mg total) by mouth daily. 30 capsule 11   telmisartan (MICARDIS) 40 MG tablet Take 40 mg by mouth daily.     theophylline (THEO-24) 300 MG 24 hr capsule Take 300 mg by mouth daily. Takes 200 mg for a week , 100 mg for another week and stop     Tryptophan 500 MG CAPS Take 1 capsule by mouth at bedtime.     zaleplon  (SONATA ) 10 MG capsule Take 1 capsule (10 mg total) by mouth at bedtime as needed for sleep. 30 capsule 3   No current facility-administered medications for this visit.     Musculoskeletal: Strength & Muscle Tone: UTA Gait & Station: Seated Patient leans: N/A  Psychiatric Specialty Exam: Review of Systems  Psychiatric/Behavioral:  Positive for sleep disturbance.     There were no vitals taken for this visit.There is no height or weight on file to calculate BMI.  General Appearance: Fairly Groomed  Eye Contact:  Fair  Speech:  Clear and Coherent  Volume:  Normal  Mood:  Euthymic  Affect:  Appropriate  Thought Process:  Goal Directed and Descriptions of Associations: Intact  Orientation:  Full (Time, Place, and Person)  Thought Content: Logical   Suicidal Thoughts:  No  Homicidal Thoughts:  No  Memory:  Immediate;   Fair Recent;   Fair Remote;   Fair  Judgement:  Fair  Insight:  Fair  Psychomotor Activity:  Normal  Concentration:  Concentration: Fair and Attention Span: Fair  Recall:  [employer] of Knowledge: Fair   Language: Fair  Akathisia:  No  Handed:  Right  AIMS (if indicated): not done  Assets:  Desire for Improvement Housing Social Support  ADL's:  Intact  Cognition: WNL  Sleep:   difficulty falling asleep at times although able to get 7 hours of sleep once he is asleep.   Screenings: GAD-7    Flowsheet Row Office Visit from 12/23/2023 in [facility] Office Visit from 04/01/2023 in [facility]  Total GAD-7 Score 4 0   PHQ2-9    Flowsheet Row Office Visit from 12/23/2023 in [facility] Office Visit from 07/17/2023 in [facility] Visit from 04/01/2023 in [facility] Video Visit from 11/26/2022 in [facility] Video Visit from 10/10/2022 in [facility]  PHQ-2 Total Score 2 0 1 0 0  PHQ-9 Total Score 5 -- 5 -- --   Flowsheet Row Video Visit from 07/27/2024 in [facility] Video Visit from 03/22/2024 in [facility] Office Visit from 12/23/2023 in [facility]  C-SSRS RISK CATEGORY No Risk No Risk No Risk     Assessment and Plan: Adrian Lewis is a 78 year old Caucasian male who has a history of bipolar disorder, insomnia, cognitive disorder likely mild, history of thyroidectomy, history of cardiac problems was evaluated by telemedicine today.  Discussed assessment and plan as noted below.  Bipolar disorder type II depression-stable Currently reports overall mood symptoms are stable on the current combination of lithium , Lamictal  and Wellbutrin .  Would like to stay on this medication regimen at this time.  Recent lithium  levels were therapeutic.  Would like to continue  monitoring lithium  level every month since he is also on telmisartan and is concerned about drug to drug interaction. Continue Lithium  carbonate 900 mg daily Continue Lamictal  100 mg daily Continue Bupropion  SR 200 mg in the morning.  Insomnia-stable Although does have difficulty with sleep initiation overall sleep has been good. Continue sleep hygiene techniques Continue CPAP therapy Continue Sonata  10 mg at bedtime Reviewed [place] PMP AWARxE  Mild cognitive disorder-chronic Currently denies any worsening of cognitive issues.  Was able to answer questions appropriately with no difficulty in session. Will monitor closely.  High risk medication use-will order lithium  level, BUN/creatinine-patient to pick up lab slips which were pointed out and given to CMA. Most recent lithium  level dated 07/15/2024-0.7-therapeutic Most recent TSH dated 03/31/2024-1.503-normal.  Follow-up Follow-up in clinic in 5 months or sooner in person  Consent: Patient/Guardian gives verbal consent for treatment and assignment of benefits for services provided during this visit. Patient/Guardian expressed understanding and agreed to proceed.   This note was generated in part or whole with voice recognition software. Voice recognition is  usually quite accurate but there are transcription errors that can and very often do occur. I apologize for any typographical errors that were not detected and corrected.    Keonia Pasko, MD 07/28/2024, 8:32 AM

## 2024-08-02 ENCOUNTER — Ambulatory Visit: Payer: Self-pay | Admitting: Surgery

## 2024-08-02 NOTE — H&P (Signed)
 Subjective:   CC: Bilateral recurrent inguinal hernia without obstruction or gangrene [K40.21]  HPI: referred by Self Referral for evaluation of above.   History of Present Illness Adrian Lewis is a 78 year old male who presents with concerns of a right inguinal hernia.  He underwent bilateral hernia repairs with mesh placement 15 to 20 years ago. Approximately three weeks ago, he experienced significant strain, raising concerns about hernia recurrence. He feels weakness and pressure in the right inguinal area, though symptoms have improved since the initial strain.  He remains active, working and engaging in fitness training, and is concerned about exacerbating the condition through physical activity.   Past Medical History:  has a past medical history of Anxiety and depression, Benign essential HTN (12/19/2020), Bipolar 2 disorder (CMS/HHS-HCC), Cataract, Coronary artery disease involving native coronary artery of native heart without angina pectoris (08/14/2023), Depression, Hypothyroidism (10/07/2013), Kidney stones, Macular degeneration, and OSA (obstructive sleep apnea).  Past Surgical History:  Past Surgical History:  Procedure Laterality Date   ARTHROSCOPY KNEE Left 03/24/2013   Procedure: ARTHROSCOPY KNEE;  Surgeon: Morton Jomarie Siva, MD;  Location: [facility] OR;  Service: Orthopedics;  Laterality: Left;   right cataract extraction  04/28/13   ARTHROPLASTY KNEE CONDYLE & PLATEAU MEDIAL/LATERAL Left 10/15/2013   Procedure: ARTHROPLASTY KNEE CONDYLE & PLATEAU MEDIAL/LATERAL;  Surgeon: Morton Jomarie Siva, MD;  Location: [facility] OR;  Service: Orthopedics;  Laterality: Left;   CATARACT EXTRACTION     w/ repair partial retinal detachment   CORRECTION HAMMER TOE     EYE SURGERY     HERNIA REPAIR     x2, LEFT, RIGHT DONE AT DIFFERENT TIMES   LITHOTRIPSY      Family History: family history includes Alcohol abuse in his father; Depression in his father and mother; Diabetes type  II in his brother and maternal grandmother; Heart disease in his father; Myocardial Infarction (Heart attack) in his father and paternal grandfather.  Social History:  reports that he has never smoked. He has never been exposed to tobacco smoke. He has never used smokeless tobacco. He reports current alcohol use of about 4.0 - 5.0 standard drinks of alcohol per week. He reports that he does not use drugs.  Current Medications: has a current medication list which includes the following prescription(s): atorvastatin, bupropion , chlorhexidine , cyanocobalamin, epinephrine , ergocalciferol  (vitamin d2), lamotrigine , levothyroxine, lithium  carbonate, potassium citrate, prevident 5000 plus, tamsulosin , telmisartan, zaleplon , acetaminophen , and acetaminophen -codeine.  Allergies:  Allergies as of 08/02/2024 - Reviewed 08/02/2024  Allergen Reaction Noted   Bee sting kit Anaphylaxis 01/29/2021   Cat dander Other (See Comments) 01/29/2021   Clonidine Headache and Other (See Comments) 11/29/2021    ROS:  A 15 point review of systems was performed and pertinent positives and negatives noted in HPI   Objective:     BP 138/83   Pulse 93   Ht 170.2 cm (5' 7)   Wt 77.1 kg (170 lb)   BMI 26.63 kg/m   Constitutional :  Alert, cooperative, no distress  Lymphatics/Throat:  Supple, no lymphadenopathy  Respiratory:  clear to auscultation bilaterally  Cardiovascular:  regular rate and rhythm  Gastrointestinal: soft, non-tender; bowel sounds normal; no masses,  no organomegaly. inguinal hernia noted.  small, no overlying skin changes, and bilateral  Musculoskeletal: Steady gait and movement  Skin: Cool and moist  Psychiatric: Normal affect, non-agitated, not confused       LABS:  N/a   RADS: N/a Assessment:       Bilateral recurrent  inguinal hernia without obstruction or gangrene [K40.21]  Plan:     1. Bilateral recurrent inguinal hernia without obstruction or gangrene [K40.21]   Discussed  the risk of surgery including recurrence, which can be up to 50% in the case of incisional or complex hernias, possible use of prosthetic materials (mesh) and the increased risk of mesh infxn if used, bleeding, chronic pain, post-op infxn, post-op SBO or ileus, and possible re-operation to address said risks. The risks of general anesthetic, if used, includes MI, CVA, sudden death or even reaction to anesthetic medications also discussed. Alternatives include continued observation.  Benefits include possible symptom relief, prevention of incarceration, strangulation, enlargement in size over time, and the risk of emergency surgery in the face of strangulation.   Typical post-op recovery time of 3-5 days with 2 weeks of activity restrictions were also discussed.  ED return precautions given for sudden increase in pain, size of hernia with accompanying fever, nausea, and/or vomiting.  The patient verbalized understanding and all questions were answered to the patient's satisfaction.   2. Patient has elected to proceed with surgical treatment. Procedure will be scheduled.  Bilateral recurrent inguinal hernia without obstruction or gangrene [K40.21], right, likely bilateral, both recurrent. robotic assisted laparoscopic [phone]  labs/images/medications/previous chart entries reviewed personally and relevant changes/updates noted above.

## 2024-08-02 NOTE — H&P (View-Only) (Signed)
 Subjective:   CC: Bilateral recurrent inguinal hernia without obstruction or gangrene [K40.21]  HPI: referred by Self Referral for evaluation of above.   History of Present Illness Adrian Lewis is a 78 year old male who presents with concerns of a right inguinal hernia.  He underwent bilateral hernia repairs with mesh placement 15 to 20 years ago. Approximately three weeks ago, he experienced significant strain, raising concerns about hernia recurrence. He feels weakness and pressure in the right inguinal area, though symptoms have improved since the initial strain.  He remains active, working and engaging in fitness training, and is concerned about exacerbating the condition through physical activity.   Past Medical History:  has a past medical history of Anxiety and depression, Benign essential HTN (12/19/2020), Bipolar 2 disorder (CMS/HHS-HCC), Cataract, Coronary artery disease involving native coronary artery of native heart without angina pectoris (08/14/2023), Depression, Hypothyroidism (10/07/2013), Kidney stones, Macular degeneration, and OSA (obstructive sleep apnea).  Past Surgical History:  Past Surgical History:  Procedure Laterality Date   ARTHROSCOPY KNEE Left 03/24/2013   Procedure: ARTHROSCOPY KNEE;  Surgeon: Morton Jomarie Siva, MD;  Location: [facility] OR;  Service: Orthopedics;  Laterality: Left;   right cataract extraction  04/28/13   ARTHROPLASTY KNEE CONDYLE & PLATEAU MEDIAL/LATERAL Left 10/15/2013   Procedure: ARTHROPLASTY KNEE CONDYLE & PLATEAU MEDIAL/LATERAL;  Surgeon: Morton Jomarie Siva, MD;  Location: [facility] OR;  Service: Orthopedics;  Laterality: Left;   CATARACT EXTRACTION     w/ repair partial retinal detachment   CORRECTION HAMMER TOE     EYE SURGERY     HERNIA REPAIR     x2, LEFT, RIGHT DONE AT DIFFERENT TIMES   LITHOTRIPSY      Family History: family history includes Alcohol abuse in his father; Depression in his father and mother; Diabetes type  II in his brother and maternal grandmother; Heart disease in his father; Myocardial Infarction (Heart attack) in his father and paternal grandfather.  Social History:  reports that he has never smoked. He has never been exposed to tobacco smoke. He has never used smokeless tobacco. He reports current alcohol use of about 4.0 - 5.0 standard drinks of alcohol per week. He reports that he does not use drugs.  Current Medications: has a current medication list which includes the following prescription(s): atorvastatin, bupropion , chlorhexidine , cyanocobalamin, epinephrine , ergocalciferol  (vitamin d2), lamotrigine , levothyroxine, lithium  carbonate, potassium citrate, prevident 5000 plus, tamsulosin , telmisartan, zaleplon , acetaminophen , and acetaminophen -codeine.  Allergies:  Allergies as of 08/02/2024 - Reviewed 08/02/2024  Allergen Reaction Noted   Bee sting kit Anaphylaxis 01/29/2021   Cat dander Other (See Comments) 01/29/2021   Clonidine Headache and Other (See Comments) 11/29/2021    ROS:  A 15 point review of systems was performed and pertinent positives and negatives noted in HPI   Objective:     BP 138/83   Pulse 93   Ht 170.2 cm (5' 7)   Wt 77.1 kg (170 lb)   BMI 26.63 kg/m   Constitutional :  Alert, cooperative, no distress  Lymphatics/Throat:  Supple, no lymphadenopathy  Respiratory:  clear to auscultation bilaterally  Cardiovascular:  regular rate and rhythm  Gastrointestinal: soft, non-tender; bowel sounds normal; no masses,  no organomegaly. inguinal hernia noted.  small, no overlying skin changes, and bilateral  Musculoskeletal: Steady gait and movement  Skin: Cool and moist  Psychiatric: Normal affect, non-agitated, not confused       LABS:  N/a   RADS: N/a Assessment:       Bilateral recurrent  inguinal hernia without obstruction or gangrene [K40.21]  Plan:     1. Bilateral recurrent inguinal hernia without obstruction or gangrene [K40.21]   Discussed  the risk of surgery including recurrence, which can be up to 50% in the case of incisional or complex hernias, possible use of prosthetic materials (mesh) and the increased risk of mesh infxn if used, bleeding, chronic pain, post-op infxn, post-op SBO or ileus, and possible re-operation to address said risks. The risks of general anesthetic, if used, includes MI, CVA, sudden death or even reaction to anesthetic medications also discussed. Alternatives include continued observation.  Benefits include possible symptom relief, prevention of incarceration, strangulation, enlargement in size over time, and the risk of emergency surgery in the face of strangulation.   Typical post-op recovery time of 3-5 days with 2 weeks of activity restrictions were also discussed.  ED return precautions given for sudden increase in pain, size of hernia with accompanying fever, nausea, and/or vomiting.  The patient verbalized understanding and all questions were answered to the patient's satisfaction.   2. Patient has elected to proceed with surgical treatment. Procedure will be scheduled.  Bilateral recurrent inguinal hernia without obstruction or gangrene [K40.21], right, likely bilateral, both recurrent. robotic assisted laparoscopic [phone]  labs/images/medications/previous chart entries reviewed personally and relevant changes/updates noted above.

## 2024-08-05 ENCOUNTER — Ambulatory Visit
Admission: RE | Admit: 2024-08-05 | Discharge: 2024-08-05 | Disposition: A | Discharge status: Home / Self Care | Source: Ambulatory Visit | Attending: Otolaryngology | Admitting: Otolaryngology

## 2024-08-05 DIAGNOSIS — R633 Feeding difficulties, unspecified: Secondary | ICD-10-CM | POA: Diagnosis present

## 2024-08-05 DIAGNOSIS — R1310 Dysphagia, unspecified: Secondary | ICD-10-CM | POA: Diagnosis present

## 2024-08-05 NOTE — Therapy (Signed)
 Modified Barium Swallow Study  Patient Details  Name: Adrian Lewis MRN: [id number] Date of Birth: October 30, 1946  Today's Date: 08/05/2024  Modified Barium Swallow completed.  Full report located under Chart Review in the Imaging Section.  History of Present Illness Pt is a 78 y.o. male who presents for a swallowing evaluation in setting of L vocal cord paralysis, R vocal fold paresis, and GERD. Pt with PMHx including, but not limited to, depression, hearing loss, HTN, MCI, sleep apnea, thyroid disease. Per Otolaryngology note, 12/01/23,   Larynx -  there was mild interarytenoid edema, no erythema. No lesions of the epiglottis, false folds, or aryepiglottic folds  -  Vocal Folds -                           Mobility: Right VF hypomobility and Left VF immobility              Amplitude: Symmetric bilaterally              Mucosal Wave: Mucosal wave intact bilaterally              Closure: Complete              Lesions/Other findings: bilateral vocal fold atrophy.  Pt known to SLP services at [facility] and [facility]. Most recent MBSS, 07/18/23, noted penetration and aspiration with thin and nectar consistencies. Pt elected to continue a regular diet with thin liquids with safe swallowing precautions at that time.   Clinical Impression Pt seen for MBSS. Pt with improved oropharyngeal swallow function in comparison to MBSS 07/18/23. Pt with a mild pharyngeal dysphagia c/b swallow initiation at the level of the vallecula or pyriform sinus with before the swallow transient laryngeal penetraton with thin liquids and trace-mild after the swallow laryngeal penetration as a result of vallecular stasis. Cued cough/throat clearing was limited in its effectiveness. Oral swallow appeared [facility] and esophageal screening at the level of the UES was unremarkable. Pharyngeal deficits secondary to decreased base of tongue retraction and mistimed laryngeal vestibule closure. Recommend continuation of a regular diet with thin liquids with  standard aspiration precautions. At pt's request, pt emailed EMST directions for resuming at home. No f/u ST indicated at this time. Factors that may increase risk of adverse event in presence of aspiration [employer] 2021): Weak cough (hx L vocal cord paralysis, R vocal cord paresis and MCI)  Swallow Evaluation Recommendations Recommendations: PO diet PO Diet Recommendation: Regular;Thin liquids (Level 0) (well moistened solids; pt endorsed difficulty with particulate solids) Liquid Administration via: Spoon;Cup Medication Administration:  (as tolerated) Supervision: Patient able to self-feed (suspect pt may benefit from reminders for safe swallowing strategies) Swallowing strategies  : Slow rate;Small bites/sips Postural changes: Position pt fully upright for meals;Stay upright 30-60 min after meals;Out of bed for meals Oral care recommendations: Oral care BID (2x/day)     Delon Bangs, M.S., CCC-SLP Speech-Language Pathologist [facility] [phone] (ASCOM)  Delon HERO Adonus Uselman 08/05/2024,1:48 PM

## 2024-08-09 ENCOUNTER — Telehealth: Payer: Self-pay

## 2024-08-09 DIAGNOSIS — Z79899 Other long term (current) drug therapy: Secondary | ICD-10-CM

## 2024-08-09 MED ORDER — LITHIUM CARBONATE ER 450 MG PO TBCR: 900.0000 mg | EXTENDED_RELEASE_TABLET | Freq: Every day | ORAL | 1 refills | Status: AC

## 2024-08-09 NOTE — Telephone Encounter (Signed)
 Received a fax from patients pharmacy requesting a refill of lithium  carbonate (ESKALITH ) 450 MG ER tablet please advise   Last visit 07-27-24 Next visit 12-28-24    Preferred Pharmacies   [facility] [id number] - [place], [place] - [address] Phone: [phone]  Fax: [phone]

## 2024-08-09 NOTE — Addendum Note (Signed)
 Addended byBETHA COBY HEIGHT on: 08/09/2024 02:57 PM   Modules accepted: Orders

## 2024-08-09 NOTE — Telephone Encounter (Signed)
 I have sent lithium  as requested to [facility].

## 2024-08-11 ENCOUNTER — Ambulatory Visit (INDEPENDENT_AMBULATORY_CARE_PROVIDER_SITE_OTHER): Admitting: Podiatry

## 2024-08-11 DIAGNOSIS — M2042 Other hammer toe(s) (acquired), left foot: Secondary | ICD-10-CM | POA: Diagnosis not present

## 2024-08-11 NOTE — Progress Notes (Signed)
 He presents today for a follow-up of his callus to his third toe.  States that currently he does not have any arthritic pain or neuropathic pain but because he purchased an e-stim online foot $279.  He states that it has worked remarkably well.  Still has a callus on the tip of the third toe left foot but barely wants to trim stating that the last time we trimmed it it was an infection that ensued.  Objective: Vital signs are stable he is alert oriented x 3 there is no erythema edema salines drainage odor no reproducible pain today.  He has hammertoe deformity with benign skin lesion plantarly.  Assessment: Benign skin lesion plantar aspect foot.  Plan: Follow-up with me on an as-needed basis.

## 2024-08-13 ENCOUNTER — Telehealth: Payer: Self-pay

## 2024-08-13 DIAGNOSIS — F3176 Bipolar disorder, in full remission, most recent episode depressed: Secondary | ICD-10-CM

## 2024-08-13 MED ORDER — BUPROPION HCL ER (SR) 200 MG PO TB12
200.0000 mg | ORAL_TABLET | Freq: Every morning | ORAL | 1 refills | Status: AC
Start: 2024-08-13 — End: ?

## 2024-08-13 NOTE — Telephone Encounter (Signed)
 pt notified that rx has been sent to the pharmacy and to check with pharmacy later today

## 2024-08-13 NOTE — Telephone Encounter (Signed)
I have sent Wellbutrin to pharmacy as requested.

## 2024-08-13 NOTE — Telephone Encounter (Signed)
 received fax for refill on the bupropion . pt was last seen on 9-2 next appt 2-3

## 2024-08-17 ENCOUNTER — Ambulatory Visit: Payer: Self-pay | Admitting: Psychiatry

## 2024-08-17 LAB — LITHIUM LEVEL: Lithium Lvl: 0.6 mmol/L (ref 0.5–1.2)

## 2024-08-19 ENCOUNTER — Encounter
Admission: RE | Admit: 2024-08-19 | Discharge: 2024-08-19 | Disposition: A | Discharge status: Home / Self Care | Source: Ambulatory Visit | Attending: Surgery | Admitting: Surgery

## 2024-08-19 VITALS — Wt 170.0 lb

## 2024-08-19 DIAGNOSIS — R001 Bradycardia, unspecified: Secondary | ICD-10-CM

## 2024-08-19 DIAGNOSIS — R7309 Other abnormal glucose: Secondary | ICD-10-CM

## 2024-08-19 DIAGNOSIS — K4090 Unilateral inguinal hernia, without obstruction or gangrene, not specified as recurrent: Secondary | ICD-10-CM

## 2024-08-19 DIAGNOSIS — Z01810 Encounter for preprocedural cardiovascular examination: Secondary | ICD-10-CM

## 2024-08-19 DIAGNOSIS — I2510 Atherosclerotic heart disease of native coronary artery without angina pectoris: Secondary | ICD-10-CM

## 2024-08-19 DIAGNOSIS — Z01812 Encounter for preprocedural laboratory examination: Secondary | ICD-10-CM

## 2024-08-19 DIAGNOSIS — R9431 Abnormal electrocardiogram [ECG] [EKG]: Secondary | ICD-10-CM

## 2024-08-19 DIAGNOSIS — I10 Essential (primary) hypertension: Secondary | ICD-10-CM

## 2024-08-19 HISTORY — DX: Obstructive sleep apnea (adult) (pediatric): G47.33

## 2024-08-19 HISTORY — DX: Atherosclerotic heart disease of native coronary artery without angina pectoris: I25.10

## 2024-08-19 HISTORY — DX: Dysphonia: R49.0

## 2024-08-19 NOTE — Patient Instructions (Addendum)
 Your procedure is scheduled on:08-26-24 Thursday Report to the Registration Desk on the 1st floor of the Medical Mall.Then proceed to the 2nd floor Surgery Desk To find out your arrival time, please call [phone] between 1PM - 3PM on:08-25-24 Wednesday If your arrival time is 6:00 am, do not arrive before that time as the Medical Mall entrance doors do not open until 6:00 am.  REMEMBER: Instructions that are not followed completely may result in serious medical risk, up to and including death; or upon the discretion of your surgeon and anesthesiologist your surgery may need to be rescheduled.  Do not eat food after midnight the night before surgery.  No gum chewing or hard candies.  You may however, drink CLEAR liquids up to 2 hours before you are scheduled to arrive for your surgery. Do not drink anything within 2 hours of your scheduled arrival time.  Clear liquids include: - water  - apple juice without pulp - gatorade (not RED colors) - black coffee or tea (Do NOT add milk or creamers to the coffee or tea) Do NOT drink anything that is not on this list.  One week prior to surgery:Stop NOW (08-19-24) Stop Anti-inflammatories (NSAIDS) such as Advil, Aleve, Ibuprofen, Motrin, Naproxen, Naprosyn and Aspirin based products such as Excedrin, Goody's Powder, BC Powder. Stop ANY OVER THE COUNTER supplements until after surgery (Vitamin C, B Complex, CoQ10, Vitamin B12, Magnesium Oxide, Vitamin K2, Prostate Health, Ocuvite Preservision, Fish Oil, Adrenogen, Probiotic, Quercetin, Reservatrol, SAMe, Saw Palmetto, Tryptophan)  You may however, continue to take Tylenol  if needed for pain up until the day of surgery.  Continue taking all of your other prescription medications up until the day of surgery.  ON THE DAY OF SURGERY ONLY TAKE THESE MEDICATIONS WITH SIPS OF WATER: -buPROPion  (WELLBUTRIN  SR)  -lamoTRIgine  (LAMICTAL )  -levothyroxine (SYNTHROID, LEVOTHROID)  -lithium  carbonate  (ESKALITH )   No Alcohol for 24 hours before or after surgery.  No Smoking including e-cigarettes for 24 hours before surgery.  No chewable tobacco products for at least 6 hours before surgery.  No nicotine patches on the day of surgery.  Do not use any recreational drugs for at least a week (preferably 2 weeks) before your surgery.  Please be advised that the combination of cocaine and anesthesia may have negative outcomes, up to and including death. If you test positive for cocaine, your surgery will be cancelled.  On the morning of surgery brush your teeth with toothpaste and water, you may rinse your mouth with mouthwash if you wish. Do not swallow any toothpaste or mouthwash.  Use CHG Soap as directed on instruction sheet.  Bring your C-pap machine to the hospital  Do not wear jewelry, make-up, hairpins, clips or nail polish.  For welded (permanent) jewelry: bracelets, anklets, waist bands, etc.  Please have this removed prior to surgery.  If it is not removed, there is a chance that hospital personnel will need to cut it off on the day of surgery.  Do not wear lotions, powders, or perfumes.   Do not shave body hair from the neck down 48 hours before surgery.  Contact lenses, hearing aids and dentures may not be worn into surgery.  Do not bring valuables to the hospital. [facility] is not responsible for any missing/lost belongings or valuables.   Notify your doctor if there is any change in your medical condition (cold, fever, infection).  Wear comfortable clothing (specific to your surgery type) to the hospital.  After surgery, you  can help prevent lung complications by doing breathing exercises.  Take deep breaths and cough every 1-2 hours. Your doctor may order a device called an Incentive Spirometer to help you take deep breaths. When coughing or sneezing, hold a pillow firmly against your incision with both hands. This is called "splinting." Doing this helps protect  your incision. It also decreases belly discomfort.  If you are being admitted to the hospital overnight, leave your suitcase in the car. After surgery it may be brought to your room.  In case of increased patient census, it may be necessary for you, the patient, to continue your postoperative care in the Same Day Surgery department.  If you are being discharged the day of surgery, you will not be allowed to drive home. You will need a responsible individual to drive you home and stay with you for 24 hours after surgery.   If you are taking public transportation, you will need to have a responsible individual with you.  Please call the Pre-admissions Testing Dept. at [phone] if you have any questions about these instructions.  Surgery Visitation Policy:  Patients having surgery or a procedure may have two visitors.  Children under the age of 72 must have an adult with them who is not the patient.                                                                                                             Preparing for Surgery with CHLORHEXIDINE  GLUCONATE (CHG) Soap  Chlorhexidine  Gluconate (CHG) Soap  o An antiseptic cleaner that kills germs and bonds with the skin to continue killing germs even after washing  o Used for showering the night before surgery and morning of surgery  Before surgery, you can play an important role by reducing the number of germs on your skin.  CHG (Chlorhexidine  gluconate) soap is an antiseptic cleanser which kills germs and bonds with the skin to continue killing germs even after washing.  Please do not use if you have an allergy to CHG or antibacterial soaps. If your skin becomes reddened/irritated stop using the CHG.  1. Shower the NIGHT BEFORE SURGERY and the MORNING OF SURGERY with CHG soap.  2. If you choose to wash your hair, wash your hair first as usual with your normal shampoo.  3. After shampooing, rinse your hair and body thoroughly to  remove the shampoo.  4. Use CHG as you would any other liquid soap. You can apply CHG directly to the skin and wash gently with a scrungie or a clean washcloth.  5. Apply the CHG soap to your body only from the neck down. Do not use on open wounds or open sores. Avoid contact with your eyes, ears, mouth, and genitals (private parts). Wash face and genitals (private parts) with your normal soap.  6. Wash thoroughly, paying special attention to the area where your surgery will be performed.  7. Thoroughly rinse your body with warm water.  8. Do not shower/wash with your normal soap after using and  rinsing off the CHG soap.  9. Pat yourself dry with a clean towel.  10. Wear clean pajamas to bed the night before surgery.  12. Place clean sheets on your bed the night of your first shower and do not sleep with pets.  13. Shower again with the CHG soap on the day of surgery prior to arriving at the hospital.  14. Do not apply any deodorants/lotions/powders.  15. Please wear clean clothes to the hospital.   [employer] to address health-related social needs:  [email or web address]

## 2024-08-20 ENCOUNTER — Encounter
Admission: RE | Admit: 2024-08-20 | Discharge: 2024-08-20 | Disposition: A | Discharge status: Home / Self Care | Source: Ambulatory Visit | Attending: Surgery | Admitting: Surgery

## 2024-08-20 DIAGNOSIS — Z01818 Encounter for other preprocedural examination: Secondary | ICD-10-CM | POA: Insufficient documentation

## 2024-08-20 DIAGNOSIS — R001 Bradycardia, unspecified: Secondary | ICD-10-CM | POA: Diagnosis not present

## 2024-08-20 DIAGNOSIS — Z01812 Encounter for preprocedural laboratory examination: Secondary | ICD-10-CM

## 2024-08-20 DIAGNOSIS — I10 Essential (primary) hypertension: Secondary | ICD-10-CM | POA: Insufficient documentation

## 2024-08-20 DIAGNOSIS — R9431 Abnormal electrocardiogram [ECG] [EKG]: Secondary | ICD-10-CM | POA: Diagnosis not present

## 2024-08-20 DIAGNOSIS — I2510 Atherosclerotic heart disease of native coronary artery without angina pectoris: Secondary | ICD-10-CM | POA: Diagnosis not present

## 2024-08-20 DIAGNOSIS — R7309 Other abnormal glucose: Secondary | ICD-10-CM | POA: Insufficient documentation

## 2024-08-20 DIAGNOSIS — Z01810 Encounter for preprocedural cardiovascular examination: Secondary | ICD-10-CM

## 2024-08-20 LAB — CBC
HCT: 43.1 % (ref 39.0–52.0)
Hemoglobin: 14 g/dL (ref 13.0–17.0)
MCH: 31.6 pg (ref 26.0–34.0)
MCHC: 32.5 g/dL (ref 30.0–36.0)
MCV: 97.3 fL (ref 80.0–100.0)
Platelets: 299 K/uL (ref 150–400)
RBC: 4.43 MIL/uL (ref 4.22–5.81)
RDW: 12.9 % (ref 11.5–15.5)
WBC: 10.3 K/uL (ref 4.0–10.5)
nRBC: 0 % (ref 0.0–0.2)

## 2024-08-20 LAB — BASIC METABOLIC PANEL WITH GFR
Anion gap: 10 (ref 5–15)
BUN: 18 mg/dL (ref 8–23)
CO2: 24 mmol/L (ref 22–32)
Calcium: 8.9 mg/dL (ref 8.9–10.3)
Chloride: 108 mmol/L (ref 98–111)
Creatinine, Ser: 0.93 mg/dL (ref 0.61–1.24)
GFR, Estimated: >60 mL/min (ref >60)
Glucose, Bld: 106 mg/dL — ABNORMAL HIGH (ref 70–99)
Potassium: 4.3 mmol/L (ref 3.5–5.1)
Sodium: 142 mmol/L (ref 135–145)

## 2024-08-24 ENCOUNTER — Encounter: Payer: Self-pay | Admitting: Surgery

## 2024-08-24 NOTE — Progress Notes (Signed)
 Perioperative / Anesthesia Services  Pre-Admission Testing Clinical Review / Pre-Operative Anesthesia Consult  Date: 08/25/24  PATIENT DEMOGRAPHICS: Name: Adrian Lewis DOB: 10-30-1946 MRN:   [id number]  Note: Available PAT nursing documentation and vital signs have been reviewed. Clinical nursing staff has updated patient's PMH/PSHx, current medication list, and drug allergies/intolerances to ensure complete and comprehensive history available to assist care teams in MDM as it pertains to the aforementioned surgical procedure and anticipated anesthetic course. Extensive review of available clinical information personally performed. [place] PMH and PSHx updated with any diagnoses/procedures that  may have been inadvertently omitted during his intake with the pre-admission testing department's nursing staff.  PLANNED SURGICAL PROCEDURE(S):   Case: [id number] Date/Time: 08/26/24 0944   Procedure: REPAIR, HERNIA, INGUINAL, ROBOT-ASSISTED, LAPAROSCOPIC, USING MESH (Right: Abdomen)   Anesthesia type: General   Pre-op diagnosis: K40.21  Bilateral recurrent inguinal hernia without obstructruction or gangrene   Location: [facility] OR ROOM 04 / [facility]S FOR AN[facility]   Surgeons: Adrian Millet, DO        CLINICAL DISCUSSION: Adrian Lewis is a 78 y.o. male who is submitted for pre-surgical anesthesia review and clearance prior to him undergoing the above procedure. Patient has never been a smoker in the past. 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   Patient is followed by cardiology Philippe, MD). He was last seen in the cardiology clinic on 02/26/2024; notes reviewed. At the time of his clinic visit, patient doing well overall from a cardiovascular perspective. Patient denied any chest pain, shortness of breath, PND, orthopnea, palpitations, significant peripheral edema, weakness, fatigue, vertiginous symptoms, or presyncope/syncope. Patient with a past medical history significant for cardiovascular diagnoses. Documented physical exam was grossly benign, providing no evidence of acute exacerbation and/or decompensation of the patient's known cardiovascular conditions.  Most recent TTE performed on 05/13/2023 revealed a low normal left ventricular systolic function with an EF of 50%. There was mild LVH.  There were no regional wall motion abnormalities.  Left ventricular diastolic Doppler parameters were normal.  Right ventricular size and function normal. RVSP = 23.4 mmHg.  There was mild pan valvular regurgitation.  All transvalvular gradients were noted to be normal providing no evidence of hemodynamically significant valvular stenosis. Aorta normal in size with no evidence of ectasia or aneurysmal dilatation.  Most recent myocardial perfusion imaging study was performed on 05/20/2023 revealing a  normal left ventricular systolic function with a hyperdynamic LVEF of 70%.  There were no regional wall motion abnormalities.  No artifact or left ventricular cavity size enlargement appreciated on review of imaging. SPECT images demonstrated no evidence of stress-induced myocardial ischemia or arrhythmia; no scintigraphic evidence of scar.  TID ratio = 0.75 (normal range </= 1.2).  Patient reported to have experienced dyspnea and fatigue during stress that began at minute 3 and ended at minute 5 during recovery.  He was unable to achieve [facility].  No ST deviation was noted.  Overall, findings were not consistent with  ischemia.  Study determined to be low risk.  Blood pressure well controlled at 120/74 mmHg on currently prescribed ARB (telmisartan) monotherapy.  Patient is on atorvastatin + omega-3  fatty acid for his HLD diagnosis and ASCVD prevention. Patient is not diabetic. He does have an OSAH diagnosis and is reported to be compliant with prescribed nocturnal PAP therapy.  Functional capacity limited by chronic pain secondary to neuropathy.  Condition severely limits patient's ability to ambulate and perform outdoor work.  With that said, patient able to complete all of his ADLs/IADLs without cardiovascular limitation.  Per the DASI, patient felt to be able to achieve at least 4 METS of physical activity without experiencing any significant degrees of angina/anginal equivalent symptoms.  No changes were made to his medication regimen during his visit with cardiology.  Patient scheduled to follow-up with outpatient cardiology in 12 months or sooner if needed.  Adrian Lewis is scheduled for an elective REPAIR, HERNIA, INGUINAL, ROBOT-ASSISTED, LAPAROSCOPIC, USING MESH (Right: Abdomen) on 08/26/2024 with Dr. Henriette Pierre, DO. Given patient's past medical history significant for cardiovascular diagnoses, presurgical cardiac clearance was sought by the PAT team. Per cardiology, this patient is optimized for surgery and may proceed with the planned procedural course with a LOW risk of significant perioperative cardiovascular complications.  In review of his medication reconciliation, the patient is not noted to be taking any type of anticoagulation or antiplatelet therapies that would need to be held during his perioperative course.  Patient denies previous perioperative complications with anesthesia in the past.  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  Patient has paralysis of the LEFT cord and paresis of the RIGHT cord.  Injury  reported to have occurred in 06/2023.  He continues to work with speech therapy.  In review his EMR, it is noted that patient underwent a general anesthetic course at [facility] [place]  [facility] ([facility]) in 06/2023 without documented complications.   MOST RECENT VITAL SIGNS:    08/19/2024   10:00 AM 07/14/2024   10:01 AM 06/14/2024   10:02 AM  Vitals with BMI  Height  5' 7 5' 7  Weight 170 lbs 170 lbs 174 lbs  BMI  26.62 27.25  Systolic  126   Diastolic  71   Pulse  73    PROVIDERS/SPECIALISTS: NOTE: Primary physician provider listed below. Patient may have been seen by APP or partner within same practice.   PROVIDER ROLE / SPECIALTY LAST SHERLEAN Pierre Henriette, DO General Surgery (Surgeon) 08/02/2024  Glover Lenis, MD Primary Care Provider 06/21/2024  Florencio Shine, MD Cardiology 02/26/2024  Maree Mcgee, MD Otolaryngology 08/17/2024  Maree Hila, MD Neurology 07/29/2024  Coby Height, MD Psychiatry 07/27/2024  Isaiah Scrivener, MD Pulmonary Medicine 05/19/2024   ALLERGIES: Allergies  Allergen Reactions   Bee Venom Anaphylaxis    Has EPI pen for this   Cat Dander Other (See Comments)    Per pt his nose runs/congestion.    Clonidine Other (See Comments)    fatigue   Grass Pollen(K-O-R-T-Swt Vern) Other (See Comments)    Runny nose    CURRENT HOME MEDICATIONS: No current facility-administered medications for this encounter.    acetaminophen  (TYLENOL ) 500 MG tablet   acetaminophen -codeine (TYLENOL  #3) 300-30 MG tablet   Ascorbic Acid (VITAMIN C) 1000 MG tablet   atorvastatin (LIPITOR) 20 MG tablet   b complex vitamins capsule   buPROPion  (WELLBUTRIN  SR) 200 MG 12 hr tablet   Coenzyme Q10 (COQ10 PO)   cyanocobalamin (VITAMIN B12) 1000 MCG tablet   ipratropium (ATROVENT) 0.03 % nasal spray   lamoTRIgine  (  LAMICTAL ) 100 MG tablet   levothyroxine (SYNTHROID, LEVOTHROID) 25 MCG tablet   lithium  carbonate (ESKALITH ) 450 MG ER tablet   magnesium  oxide (MAG-OX) 400 MG tablet   Menaquinone-7 (VITAMIN K2) 100 MCG CAPS   Misc Natural Products (PROSTATE HEALTH PO)   Multiple Vitamins-Minerals (OCUVITE PRESERVISION PO)   Omega-3 Fatty Acids (FISH OIL) 1000 MG CAPS   omeprazole (PRILOSEC) 40 MG capsule   OVER THE COUNTER MEDICATION   potassium citrate (UROCIT-K) 10 MEQ (1080 MG) SR tablet   PREVIDENT 5000 PLUS 1.1 % CREA dental cream   Probiotic Product (PROBIOTIC DAILY PO)   Quercetin 500 MG CAPS   RESVERATROL PO   S-Adenosylmethionine (SAME) 400 MG TABS   Saw Palmetto, Serenoa repens, (SAW PALMETTO PO)   tamsulosin  (FLOMAX ) 0.4 MG CAPS capsule   telmisartan (MICARDIS) 40 MG tablet   Tryptophan 500 MG CAPS   zaleplon  (SONATA ) 10 MG capsule   HISTORY: Past Medical History:  Diagnosis Date   Aortic atherosclerosis    Arthritis    Benign essential hypertension 12/19/2020   Bilateral carotid artery disease    Bilateral cataracts    Bilateral sciatica 10/07/2013   Bipolar disorder II, in full remission, most recent episode depressed 01/21/2022   BPH (benign prostatic hyperplasia)    Cerebral microvascular disease    Circadian rhythm sleep disorder, delayed sleep phase type 01/21/2022   Coronary artery disease    Daily consumption of alcohol    Dysphonia    a.) secondary to cord paralysis/paresis following LEFT hemithyroidectomy   Generalized anxiety disorder 06/16/2017   GERD (gastroesophageal reflux disease)    History of kidney stones    Hyperlipidemia, mixed 12/19/2020   Hypothyroidism    Incomplete RBBB    Inguinal hernia    Insomnia due to medical condition 06/16/2017   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   Macular degeneration    Major depressive disorder    Medication overdose 06/16/2017   Mild cognitive impairment of uncertain or unknown etiology 01/01/2023   Nephrolithiasis 01/25/2020   OSA on CPAP    Osteopenia 04/04/2014   Paresthesia of both feet 12/03/2022   Peripheral neuropathy     Radiculopathy 02/24/2013   Rising PSA level 08/07/2021   Shortness of breath on exertion 12/19/2020   Tremor 03/19/2022   Vocal cord dysfunction    a.) paralysis LEFT and paresis RIGHT following hemithyroidectomy; working with speech therapy   Past Surgical History:  Procedure Laterality Date   CATARACT EXTRACTION W/ INTRAOCULAR LENS IMPLANT  2011 (APPROX)   RIGHT EYE AND REPAIR DETACHED RETINA   CYSTOSCOPY WITH RETROGRADE PYELOGRAM, URETEROSCOPY AND STENT PLACEMENT Bilateral 02/23/2021   Procedure: CYSTOSCOPY WITH RETROGRADE PYELOGRAM, URETEROSCOPY AND STENT PLACEMENT;  Surgeon: Alvaro Hummer, MD;  Location: [facility] ORS;  Service: Urology;  Laterality: Bilateral;  75 MINS   EYE SURGERY     HAMMER TOE SURGERY  2012  &  2010  (APPROX)   ONE TOE , EACH FOOT   HEMITHYROIDECTOMY Left 06/26/2023   HOLMIUM LASER APPLICATION Bilateral 02/23/2021   Procedure: HOLMIUM LASER APPLICATION;  Surgeon: Alvaro Hummer, MD;  Location: [facility] ORS;  Service: Urology;  Laterality: Bilateral;   INGUINAL HERNIA REPAIR Bilateral    KNEE SURGERY Left    x 2  partial and reconstructive   NASAL TURBINATE REDUCTION Bilateral 09/27/2015   Procedure: TURBINATE REDUCTION/SUBMUCOSAL RESECTION;  Surgeon: Deward Dolly, MD;  Location: [facility] ORS;  Service: ENT;  Laterality: Bilateral;   PARTIAL KNEE ARTHROPLASTY Left 2014  REPAIR RIGHT INGUINAL HERNIA W/ MESH  02/14/2006   SEPTOPLASTY N/A 09/27/2015   Procedure: SEPTOPLASTY;  Surgeon: Deward Dolly, MD;  Location: [facility] ORS;  Service: ENT;  Laterality: N/A;   URETEROSCOPY  08/12/2012   Procedure: URETEROSCOPY;  Surgeon: Ricardo Likens, MD;  Location: [facility];  Service: Urology;  Laterality: Left;  1 HR    Family History  Problem Relation Age of Onset   Dementia Mother        with advanced age; ~37s   Heart attack Father    Suicidality Sister    Suicidality Brother    Parkinson's disease Maternal Aunt    Social History   Tobacco Use   Smoking  status: Never   Smokeless tobacco: Never  Substance Use Topics   Alcohol use: Yes    Alcohol/week: 28.0 standard drinks of alcohol    Types: 28 Standard drinks or equivalent per week    Comment: 1-2 hard ciders nightly   LABS:  Hospital Outpatient Visit on 08/20/2024  Component Date Value Ref Range Status   WBC 08/20/2024 10.3  4.0 - 10.5 K/uL Final   RBC 08/20/2024 4.43  4.22 - 5.81 MIL/uL Final   Hemoglobin 08/20/2024 14.0  13.0 - 17.0 g/dL Final   HCT 90/73/7974 43.1  39.0 - 52.0 % Final   MCV 08/20/2024 97.3  80.0 - 100.0 fL Final   MCH 08/20/2024 31.6  26.0 - 34.0 pg Final   MCHC 08/20/2024 32.5  30.0 - 36.0 g/dL Final   RDW 90/73/7974 12.9  11.5 - 15.5 % Final   Platelets 08/20/2024 299  150 - 400 K/uL Final   nRBC 08/20/2024 0.0  0.0 - 0.2 % Final   Performed at [facility], [address] [place], [place] [address]   Sodium 08/20/2024 142  135 - 145 mmol/L Final   Potassium 08/20/2024 4.3  3.5 - 5.1 mmol/L Final   Chloride 08/20/2024 108  98 - 111 mmol/L Final   CO2 08/20/2024 24  22 - 32 mmol/L Final   Glucose, Bld 08/20/2024 106 (H)  70 - 99 mg/dL Final   Glucose reference range applies only to samples taken after fasting for at least 8 hours.   BUN 08/20/2024 18  8 - 23 mg/dL Final   Creatinine, Ser 08/20/2024 0.93  0.61 - 1.24 mg/dL Final   Calcium 90/73/7974 8.9  8.9 - 10.3 mg/dL Final   GFR, Estimated 08/20/2024 >60  >60 mL/min Final   Comment: (NOTE) Calculated using the CKD-EPI Creatinine Equation (2021)    Anion gap 08/20/2024 10  5 - 15 Final   Performed at [facility], [address] [place], [place] [address]    ECG: Date: 08/20/2024  Time ECG obtained: 1337 PM Rate: 90 bpm Rhythm: Sinus rhythm with first-degree AV block; IRBBB + LPFB Axis (leads I and aVF): normal Intervals: PR 302 ms. QRS 106 ms. QTc 423 ms. ST segment and T wave changes: No evidence of acute T wave abnormalities or significant ST segment elevation or  depression.  Evidence of a possible, age undetermined, prior infarct:  Yes; inferior and anterior Comparison: Previous tracing obtained on 06/26/2023 showed a junctional rhythm with a ventricular rate of 44 bpm   IMAGING / PROCEDURES: MYOCARDIAL PERFUSION IMAGING STUDY (LEXISCAN) performed on 05/20/2023 Normal left ventricular systolic function with a hyperdynamic LVEF of 70% No regional wall motion abnormalities No evidence of stress-induced myocardial ischemia or arrhythmia; no scintigraphic evidence of scar Patient reported dyspnea and  fatigue during the stress test.  Symptoms began at minute 3 during stress and ended at minute 5 during recovery. No concerning findings for ischemia Low risk study  CT SOFT TISSUE NECK W CONTRAST performed on 04/08/2023 CT appearance of left vocal cord paralysis.  No superimposed laryngeal/pharyngeal mass or cervical lymphadenopathy.  Markedly enlarged and heterogeneous thyroid, with mediastinal extension on the left. #1 might be related to this thyroid disease.  CT CHEST W CONTRAST performed on 04/08/2023 Massively enlarged heterogeneous appearing thyroid gland with substernal extension, and numerous thyroid nodules, as above. This may simply represent a thyroid goiter, but recommend thyroid ultrasound. Aortic atherosclerosis, in addition to left main and left anterior descending coronary artery disease. Please note that although the presence of coronary artery calcium documents the presence of coronary artery disease, the severity of this disease and any potential stenosis cannot be assessed on this non-gated CT examination. Assessment for potential risk factor modification, dietary therapy or pharmacologic therapy may be warranted, if clinically indicated.  TRANSTHORACIC ECHOCARDIOGRAM performed on 05/13/2023 Low normal left ventricular systolic function with an EF of 50% Mild LVH No regional wall motion abnormalities Normal right ventricular size and  function Mild pan valvular regurgitation RVSP = 23.4 mmHg Normal gradients; no valvular stenosis Pericardial effusion  MR BRAIN WO CONTRAST performed on 04/08/2022 No evidence of acute intracranial abnormality. Minimal chronic small-vessel ischemic changes within the cerebral white matter, stable from the prior brain MRI of 06/11/2017. Mild generalized cerebral atrophy. Mild paranasal sinus disease, as described. 6 mm ovoid T2 hyperintense focus within the left parotid gland, which could reflect a small primary parotid neoplasm. Consider ENT consultation.  US  CAROTID BILATERAL performed on 02/05/2017 Mild bilateral carotid bifurcation and proximal ICA atherosclerotic vascular plaque. No flow limiting stenosis. Degree of stenosis less than 50% bilaterally. Vertebral arteries are patent with antegrade flow. Large left thyroid nodule is present. Dedicated thyroid ultrasound suggested for further evaluation .     IMPRESSION AND PLAN: Adrian Lewis has been referred for pre-anesthesia review and clearance prior to him undergoing the planned anesthetic and procedural courses. Available labs, pertinent testing, and imaging results were personally reviewed by me in preparation for upcoming operative/procedural course. [facility] medical record has been updated following extensive record review and patient interview with PAT staff.   This patient has been appropriately cleared by cardiology with an overall LOW risk of patient experiencing significant perioperative cardiovascular complications. Based on clinical review performed today (08/25/24), barring any significant acute changes in the patient's overall condition, it is anticipated that he will be able to proceed with the planned surgical intervention. Any acute changes in clinical condition may necessitate his procedure being postponed and/or cancelled. Patient will meet with anesthesia team (MD and/or CRNA) on the day of his procedure for  preoperative evaluation/assessment. Questions regarding anesthetic course will be fielded at that time.   Pre-surgical instructions were reviewed with the patient during his PAT appointment, and questions were fielded to satisfaction by PAT clinical staff. He has been instructed on which medications that he will need to hold prior to surgery, as well as the ones that have been deemed safe/appropriate to take on the day of his procedure. As part of the general education provided by PAT, patient made aware both verbally and in writing, that he would need to abstain from the use of any illegal substances during his perioperative course. He was advised that failure to follow the provided instructions could necessitate case cancellation or result in serious  perioperative complications up to and including death. Patient encouraged to contact PAT and/or his surgeon's office to discuss any questions or concerns that may arise prior to surgery; verbalized understanding.   Dorise Pereyra, MSN, APRN, FNP-C, CEN [facility]  Perioperative Services Nurse Practitioner Phone: [phone] Fax: [phone] 08/25/24 8:21 AM  NOTE: This note has been prepared using Dragon dictation software. Despite my best ability to proofread, there is always the potential that unintentional transcriptional errors may still occur from this process.

## 2024-08-26 ENCOUNTER — Ambulatory Visit
Admission: RE | Admit: 2024-08-26 | Discharge: 2024-08-26 | Disposition: A | Discharge status: Home / Self Care | Source: Ambulatory Visit | Attending: Surgery | Admitting: Surgery

## 2024-08-26 ENCOUNTER — Encounter
Admission: RE | Disposition: A | Discharge status: Home / Self Care | Payer: Self-pay | Source: Ambulatory Visit | Attending: Surgery

## 2024-08-26 ENCOUNTER — Other Ambulatory Visit: Payer: Self-pay

## 2024-08-26 ENCOUNTER — Ambulatory Visit: Payer: Self-pay | Admitting: Urgent Care

## 2024-08-26 ENCOUNTER — Encounter: Payer: Self-pay | Admitting: Surgery

## 2024-08-26 DIAGNOSIS — I452 Bifascicular block: Secondary | ICD-10-CM | POA: Insufficient documentation

## 2024-08-26 DIAGNOSIS — K4091 Unilateral inguinal hernia, without obstruction or gangrene, recurrent: Secondary | ICD-10-CM | POA: Diagnosis present

## 2024-08-26 DIAGNOSIS — E039 Hypothyroidism, unspecified: Secondary | ICD-10-CM | POA: Insufficient documentation

## 2024-08-26 DIAGNOSIS — K4021 Bilateral inguinal hernia, without obstruction or gangrene, recurrent: Secondary | ICD-10-CM | POA: Insufficient documentation

## 2024-08-26 DIAGNOSIS — I10 Essential (primary) hypertension: Secondary | ICD-10-CM | POA: Diagnosis not present

## 2024-08-26 DIAGNOSIS — K219 Gastro-esophageal reflux disease without esophagitis: Secondary | ICD-10-CM | POA: Diagnosis not present

## 2024-08-26 DIAGNOSIS — I2510 Atherosclerotic heart disease of native coronary artery without angina pectoris: Secondary | ICD-10-CM | POA: Insufficient documentation

## 2024-08-26 DIAGNOSIS — G4730 Sleep apnea, unspecified: Secondary | ICD-10-CM | POA: Insufficient documentation

## 2024-08-26 HISTORY — DX: Atherosclerosis of aorta: I70.0

## 2024-08-26 HISTORY — DX: Unspecified cataract: H26.9

## 2024-08-26 HISTORY — DX: Unspecified right bundle-branch block: I45.10

## 2024-08-26 HISTORY — DX: Other specified health status: Z78.9

## 2024-08-26 HISTORY — DX: Disorder of arteries and arterioles, unspecified: I77.9

## 2024-08-26 HISTORY — DX: Benign prostatic hyperplasia without lower urinary tract symptoms: N40.0

## 2024-08-26 HISTORY — DX: Other cerebrovascular disease: I67.89

## 2024-08-26 HISTORY — DX: Other diseases of vocal cords: J38.3

## 2024-08-26 HISTORY — PX: XI ROBOTIC ASSISTED INGUINAL HERNIA REPAIR WITH MESH: SHX6706

## 2024-08-26 SURGERY — REPAIR, HERNIA, INGUINAL, ROBOT-ASSISTED, LAPAROSCOPIC, USING MESH
Anesthesia: General | Site: Abdomen | Laterality: Right

## 2024-08-26 MED ORDER — FENTANYL CITRATE (PF) 100 MCG/2ML IJ SOLN: 25.0000 ug | INTRAMUSCULAR | Status: DC | PRN

## 2024-08-26 MED ORDER — OXYCODONE HCL 5 MG PO TABS
5.0000 mg | ORAL_TABLET | Freq: Once | ORAL | Status: AC | PRN
  Administered 2024-08-26: 5 mg via ORAL

## 2024-08-26 MED ORDER — MIDAZOLAM HCL 2 MG/2ML IJ SOLN
INTRAMUSCULAR | Status: AC
  Filled 2024-08-26: qty 2

## 2024-08-26 MED ORDER — OXYCODONE HCL 5 MG/5ML PO SOLN: 5.0000 mg | Freq: Once | ORAL | Status: AC | PRN

## 2024-08-26 MED ORDER — ACETAMINOPHEN 10 MG/ML IV SOLN
INTRAVENOUS | Status: AC
  Filled 2024-08-26: qty 100

## 2024-08-26 MED ORDER — BUPIVACAINE-EPINEPHRINE (PF) 0.5% -1:200000 IJ SOLN
INTRAMUSCULAR | Status: AC
  Filled 2024-08-26: qty 30

## 2024-08-26 MED ORDER — GLYCOPYRROLATE 0.2 MG/ML IJ SOLN
INTRAMUSCULAR | Status: DC | PRN
Start: 2024-08-26 — End: 2024-08-26
  Administered 2024-08-26: .2 mg via INTRAVENOUS

## 2024-08-26 MED ORDER — HYDROMORPHONE HCL 1 MG/ML IJ SOLN
INTRAMUSCULAR | Status: DC | PRN
  Administered 2024-08-26: 1 mg via INTRAVENOUS

## 2024-08-26 MED ORDER — ACETAMINOPHEN 10 MG/ML IV SOLN
1000.0000 mg | Freq: Once | INTRAVENOUS | Status: DC | PRN
  Administered 2024-08-26: 1000 mg via INTRAVENOUS

## 2024-08-26 MED ORDER — PHENYLEPHRINE HCL-NACL 20-0.9 MG/250ML-% IV SOLN
INTRAVENOUS | Status: DC | PRN
  Administered 2024-08-26: 25 ug/min via INTRAVENOUS

## 2024-08-26 MED ORDER — CEFAZOLIN SODIUM-DEXTROSE 2-4 GM/100ML-% IV SOLN
2.0000 g | INTRAVENOUS | Status: AC
  Administered 2024-08-26: 2 g via INTRAVENOUS

## 2024-08-26 MED ORDER — SUGAMMADEX SODIUM 200 MG/2ML IV SOLN
INTRAVENOUS | Status: DC | PRN
  Administered 2024-08-26: 300 mg via INTRAVENOUS

## 2024-08-26 MED ORDER — BUPIVACAINE-EPINEPHRINE 0.5% -1:200000 IJ SOLN
INTRAMUSCULAR | Status: DC | PRN
  Administered 2024-08-26: 30 mL

## 2024-08-26 MED ORDER — DOCUSATE SODIUM 100 MG PO CAPS
100.0000 mg | ORAL_CAPSULE | Freq: Two times a day (BID) | ORAL | 0 refills | Status: AC | PRN
  Filled 2024-08-26: qty 20, 10d supply, fill #0

## 2024-08-26 MED ORDER — PROPOFOL 10 MG/ML IV BOLUS
INTRAVENOUS | Status: DC | PRN
  Administered 2024-08-26: 150 mg via INTRAVENOUS

## 2024-08-26 MED ORDER — CELECOXIB 200 MG PO CAPS
ORAL_CAPSULE | ORAL | Status: AC
  Filled 2024-08-26: qty 1

## 2024-08-26 MED ORDER — PHENYLEPHRINE 80 MCG/ML (10ML) SYRINGE FOR IV PUSH (FOR BLOOD PRESSURE SUPPORT)
PREFILLED_SYRINGE | INTRAVENOUS | Status: DC | PRN
  Administered 2024-08-26: 160 ug via INTRAVENOUS

## 2024-08-26 MED ORDER — LACTATED RINGERS IV SOLN: INTRAVENOUS | Status: DC

## 2024-08-26 MED ORDER — ONDANSETRON HCL 4 MG/2ML IJ SOLN: 4.0000 mg | Freq: Once | INTRAMUSCULAR | Status: DC | PRN

## 2024-08-26 MED ORDER — CELECOXIB 200 MG PO CAPS
200.0000 mg | ORAL_CAPSULE | ORAL | Status: AC
  Administered 2024-08-26: 200 mg via ORAL

## 2024-08-26 MED ORDER — TRAMADOL HCL 50 MG PO TABS
50.0000 mg | ORAL_TABLET | Freq: Three times a day (TID) | ORAL | 0 refills | Status: DC | PRN
  Filled 2024-08-26: qty 6, 2d supply, fill #0

## 2024-08-26 MED ORDER — SUCCINYLCHOLINE CHLORIDE 200 MG/10ML IV SOSY
PREFILLED_SYRINGE | INTRAVENOUS | Status: DC | PRN
  Administered 2024-08-26: 100 mg via INTRAVENOUS

## 2024-08-26 MED ORDER — CHLORHEXIDINE GLUCONATE CLOTH 2 % EX PADS: 6.0000 | MEDICATED_PAD | Freq: Once | CUTANEOUS | Status: DC

## 2024-08-26 MED ORDER — FENTANYL CITRATE (PF) 100 MCG/2ML IJ SOLN
INTRAMUSCULAR | Status: DC | PRN
  Administered 2024-08-26 (×2): 50 ug via INTRAVENOUS

## 2024-08-26 MED ORDER — LIDOCAINE HCL (CARDIAC) PF 100 MG/5ML IV SOSY
PREFILLED_SYRINGE | INTRAVENOUS | Status: DC | PRN
  Administered 2024-08-26: 100 mg via INTRAVENOUS

## 2024-08-26 MED ORDER — GABAPENTIN 300 MG PO CAPS
300.0000 mg | ORAL_CAPSULE | ORAL | Status: AC
  Administered 2024-08-26: 300 mg via ORAL

## 2024-08-26 MED ORDER — LACTATED RINGERS IV SOLN: INTRAVENOUS | Status: DC | PRN

## 2024-08-26 MED ORDER — CEFAZOLIN SODIUM-DEXTROSE 2-4 GM/100ML-% IV SOLN
INTRAVENOUS | Status: AC
Start: 2024-08-26 — End: 2024-08-26
  Filled 2024-08-26: qty 100

## 2024-08-26 MED ORDER — 0.9 % SODIUM CHLORIDE (POUR BTL) OPTIME
TOPICAL | Status: DC | PRN
  Administered 2024-08-26: 500 mL

## 2024-08-26 MED ORDER — ONDANSETRON HCL 4 MG/2ML IJ SOLN
INTRAMUSCULAR | Status: DC | PRN
  Administered 2024-08-26 (×2): 4 mg via INTRAVENOUS

## 2024-08-26 MED ORDER — PHENYLEPHRINE HCL-NACL 20-0.9 MG/250ML-% IV SOLN
INTRAVENOUS | Status: AC
Start: 2024-08-26 — End: 2024-08-26
  Filled 2024-08-26: qty 250

## 2024-08-26 MED ORDER — ORAL CARE MOUTH RINSE: 15.0000 mL | Freq: Once | OROMUCOSAL | Status: AC

## 2024-08-26 MED ORDER — GABAPENTIN 300 MG PO CAPS
ORAL_CAPSULE | ORAL | Status: AC
  Filled 2024-08-26: qty 1

## 2024-08-26 MED ORDER — OXYCODONE HCL 5 MG PO TABS
ORAL_TABLET | ORAL | Status: AC
  Filled 2024-08-26: qty 1

## 2024-08-26 MED ORDER — CHLORHEXIDINE GLUCONATE 0.12 % MT SOLN
15.0000 mL | Freq: Once | OROMUCOSAL | Status: AC
  Administered 2024-08-26: 15 mL via OROMUCOSAL

## 2024-08-26 MED ORDER — ROCURONIUM BROMIDE 100 MG/10ML IV SOLN
INTRAVENOUS | Status: DC | PRN
  Administered 2024-08-26: 50 mg via INTRAVENOUS

## 2024-08-26 MED ORDER — FENTANYL CITRATE (PF) 100 MCG/2ML IJ SOLN
INTRAMUSCULAR | Status: AC
  Filled 2024-08-26: qty 2

## 2024-08-26 MED ORDER — HYDROMORPHONE HCL 1 MG/ML IJ SOLN
INTRAMUSCULAR | Status: AC
  Filled 2024-08-26: qty 1

## 2024-08-26 MED ORDER — CHLORHEXIDINE GLUCONATE 0.12 % MT SOLN
OROMUCOSAL | Status: AC
  Filled 2024-08-26: qty 15

## 2024-08-26 MED ORDER — DEXAMETHASONE SODIUM PHOSPHATE 10 MG/ML IJ SOLN
INTRAMUSCULAR | Status: DC | PRN
Start: 2024-08-26 — End: 2024-08-26
  Administered 2024-08-26: 10 mg via INTRAVENOUS

## 2024-08-26 SURGICAL SUPPLY — 37 items
BAG PRESSURE INF REUSE 1000 (BAG) IMPLANT
BNDG GAUZE DERMACEA FLUFF 4 (GAUZE/BANDAGES/DRESSINGS) ×2 IMPLANT
COVER TIP SHEARS 8 DVNC (MISCELLANEOUS) ×2 IMPLANT
COVER WAND RF STERILE (DRAPES) ×2 IMPLANT
DEFOGGER SCOPE WARM SEASHARP (MISCELLANEOUS) ×2 IMPLANT
DERMABOND ADVANCED .7 DNX12 (GAUZE/BANDAGES/DRESSINGS) ×2 IMPLANT
DRAPE ARM DVNC X/XI (DISPOSABLE) ×6 IMPLANT
DRAPE COLUMN DVNC XI (DISPOSABLE) ×2 IMPLANT
ELECTRODE REM PT RTRN 9FT ADLT (ELECTROSURGICAL) ×2 IMPLANT
FORCEPS BPLR FENES DVNC XI (FORCEP) ×2 IMPLANT
GLOVE BIOGEL PI IND STRL 7.0 (GLOVE) ×4 IMPLANT
GLOVE SURG SYN 6.5 PF PI (GLOVE) ×8 IMPLANT
GOWN STRL REUS W/ TWL LRG LVL3 (GOWN DISPOSABLE) ×8 IMPLANT
IRRIGATOR SUCT 8 DISP DVNC XI (IRRIGATION / IRRIGATOR) IMPLANT
IV 0.9% NACL 1000 ML (IV SOLUTION) IMPLANT
LABEL OR SOLS (LABEL) IMPLANT
MANIFOLD NEPTUNE II (INSTRUMENTS) ×2 IMPLANT
MESH 3DMAX MID 4X6 RT LRG (Mesh General) IMPLANT
NDL DRIVE SUT CUT DVNC (INSTRUMENTS) ×2 IMPLANT
NDL HYPO 22X1.5 SAFETY MO (MISCELLANEOUS) ×2 IMPLANT
NDL INSUFFLATION 14GA 120MM (NEEDLE) ×2 IMPLANT
NEEDLE DRIVE SUT CUT DVNC (INSTRUMENTS) ×1 IMPLANT
NEEDLE HYPO 22X1.5 SAFETY MO (MISCELLANEOUS) ×1 IMPLANT
NEEDLE INSUFFLATION 14GA 120MM (NEEDLE) ×1 IMPLANT
OBTURATOR OPTICALSTD 8 DVNC (TROCAR) ×2 IMPLANT
PACK LAP CHOLECYSTECTOMY (MISCELLANEOUS) ×2 IMPLANT
SCISSORS MNPLR CVD DVNC XI (INSTRUMENTS) ×2 IMPLANT
SEAL UNIV 5-12 XI (MISCELLANEOUS) ×6 IMPLANT
SET TUBE SMOKE EVAC HIGH FLOW (TUBING) ×2 IMPLANT
SOLUTION ELECTROSURG ANTI STCK (MISCELLANEOUS) ×2 IMPLANT
SUT STRATA 3-0 15 RB-1.5 (SUTURE) ×2 IMPLANT
SUT VIC AB 2-0 SH 27XBRD (SUTURE) ×2 IMPLANT
SUTURE MNCRL 4-0 27XMF (SUTURE) ×2 IMPLANT
SYR 30ML LL (SYRINGE) ×2 IMPLANT
TAPE TRANSPORE STRL 2 31045 (GAUZE/BANDAGES/DRESSINGS) ×2 IMPLANT
TRAP FLUID SMOKE EVACUATOR (MISCELLANEOUS) ×2 IMPLANT
WATER STERILE IRR 500ML POUR (IV SOLUTION) ×2 IMPLANT

## 2024-08-26 NOTE — Anesthesia Postprocedure Evaluation (Signed)
 Anesthesia Post Note  Patient: Lennon Boutwell Slager  Procedure(s) Performed: REPAIR, HERNIA, INGUINAL, ROBOT-ASSISTED, LAPAROSCOPIC, USING MESH (Right: Abdomen)  Patient location during evaluation: PACU Anesthesia Type: General Level of consciousness: awake and alert, oriented and patient cooperative Pain management: pain level controlled Vital Signs Assessment: post-procedure vital signs reviewed and stable Respiratory status: spontaneous breathing, nonlabored ventilation and respiratory function stable Cardiovascular status: blood pressure returned to baseline and stable Postop Assessment: adequate PO intake Anesthetic complications: no   No notable events documented.   Last Vitals:  Vitals:   08/26/24 1140 08/26/24 1203  BP:  (!) 153/80  Pulse: 61   Resp: 14 16  Temp:    SpO2: 91% 95%    Last Pain:  Vitals:   08/26/24 1140  PainSc: 4                  Skarlette Lattner

## 2024-08-26 NOTE — Interval H&P Note (Signed)
 No change. OK to proceed. Right first, we will proceed with repair of left if noted at time of exam.

## 2024-08-26 NOTE — Anesthesia Procedure Notes (Signed)
 Procedure Name: Intubation Date/Time: 08/26/2024 9:36 AM  Performed by: Ledora Duncan, CRNAPre-anesthesia Checklist: Patient identified, Emergency Drugs available, Suction available and Patient being monitored Patient Re-evaluated:Patient Re-evaluated prior to induction Oxygen Delivery Method: Circle system utilized Preoxygenation: Pre-oxygenation with 100% oxygen Induction Type: IV induction Ventilation: Mask ventilation without difficulty Laryngoscope Size: McGrath and 3 Grade View: Grade II Tube type: Oral Tube size: 7.0 mm Number of attempts: 1 Airway Equipment and Method: Stylet Placement Confirmation: ETT inserted through vocal cords under direct vision, positive ETCO2 and breath sounds checked- equal and bilateral Secured at: 21 cm Tube secured with: Tape Dental Injury: Teeth and Oropharynx as per pre-operative assessment  Comments: Atraumatic TFH CRNA

## 2024-08-26 NOTE — Transfer of Care (Signed)
 Immediate Anesthesia Transfer of Care Note  Patient: Adrian Lewis  Procedure(s) Performed: REPAIR, HERNIA, INGUINAL, ROBOT-ASSISTED, LAPAROSCOPIC, USING MESH (Right: Abdomen)  Patient Location: PACU  Anesthesia Type:General  Level of Consciousness: drowsy and patient cooperative  Airway & Oxygen Therapy: Patient Spontanous Breathing and Patient connected to face mask oxygen  Post-op Assessment: Report given to RN and Post -op Vital signs reviewed and stable  Post vital signs: Reviewed and stable  Last Vitals:  Vitals Value Taken Time  BP 161/76 08/26/24 11:00  Temp 36.8 C 08/26/24 11:00  Pulse 65 08/26/24 11:03  Resp 12 08/26/24 11:03  SpO2 99 % 08/26/24 11:03  Vitals shown include unfiled device data.  Last Pain:  Vitals:   08/26/24 1100  PainSc: Asleep         Complications: No notable events documented.

## 2024-08-26 NOTE — Anesthesia Preprocedure Evaluation (Addendum)
 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  Bilateral vocal cord paralysis s/p hemithyroidectomy   Pulmonary exam normal breath sounds clear to auscultation       Cardiovascular hypertension, + CAD (mild)  Normal cardiovascular exam Rhythm:Regular Rate:Normal  ECG 08/20/24:  Sinus rhythm with 1st degree A-V block Incomplete right bundle branch block Left posterior fascicular block Inferior infarct , age undetermined Cannot rule out Anterior infarct (cited on or before 12-Jul-2010)  Myocardial perfusion 05/20/23:  Normal left ventricular function 70 to 75% No evidence of ischemia on perfusion scan Nondiagnostic stress test because of failure achieved target heart rate This is still a low risk Scan  Echo 05/13/23:  NORMAL LEFT VENTRICULAR SYSTOLIC FUNCTION   WITH MILD LVH  NORMAL RIGHT VENTRICULAR SYSTOLIC FUNCTION  MILD VALVULAR REGURGITATION   NO VALVULAR STENOSIS  MILD AR, MR, TR,PR  EF 50%     Neuro/Psych  PSYCHIATRIC DISORDERS Anxiety Depression Bipolar Disorder   Alcohol use disorder  Neuromuscular disease (neuropathy)    GI/Hepatic ,GERD  ,,  Endo/Other  Hypothyroidism    Renal/GU Renal disease (nephrolithiasis)     Musculoskeletal   Abdominal   Peds  Hematology negative hematology ROS (+)   Anesthesia Other Findings Cardiology note 02/26/24:  78 y.o. male with  Benign essential HTN  Junctional bradycardia  Coronary artery disease involving native coronary artery of native heart without angina pectoris  OSA (obstructive sleep apnea)   Hyperlipidemia, mixed   Plan   Junctional bradycardia during left Hemi thyroidectomy: 14-day Zio patch showed no significant abnormalities. No recurrences since surgery -No change to current management  Coronary artery disease: Diagnosed by CT scan on 03/2023 which showed mild coronary artery calcifications -Patient currently asymptomatic, without anginal symptoms. -Continue atorvastatin 20 mg daily.   Peripheral vascular disease: CT scan 03/2023 with carotid atherosclerosis bilaterally, prior US  from 2018 with <50% stenosis bilaterally. -Continue atorvastatin.   Palpitations: Intermittent, generally occurring at rest. EKG from PCP office in April with 1st degree AVB, has been noted on prior.  -No change to current management   Shortness of Breath: Mild valvular regurgitation on echo 04/2023. Denies LE edema. Typically worse when he bends over.  -Continue to monitor for worsening shortness of breath symptoms.   Hypertension: Blood pressure stable in clinic today at 120/74.  -Continue Telmisartan 40 mg daily. -Patient advised to monitor blood pressure daily and contact our office if this is consistently elevated above 140/90.   Hyperlipidemia: Cholesterol above goal on last lipid panel with LDL of 82 and HDL of 41 from 07/2023.  -Continue atorvastatin.   Obstructive Sleep Apnea: Diagnosed on sleep study from 11/2021.  -Continue compliance with CPAP.  -Followed by pulmonology.   No orders of the defined types were placed in this encounter.  Return in about 1 year (around 02/25/2025).    Reproductive/Obstetrics                              Anesthesia Physical Anesthesia Plan  ASA: 3  Anesthesia Plan: General   Post-op Pain Management:    Induction: Intravenous  PONV Risk Score and Plan: 2 and Ondansetron , Dexamethasone  and Treatment may vary due to age or medical condition  Airway Management Planned: Oral ETT  Additional Equipment:   Intra-op  Plan:   Post-operative Plan: Extubation in OR  Informed Consent: I have reviewed the patients History and Physical, chart, labs and discussed the procedure including the risks, benefits and alternatives for the proposed anesthesia with the patient or authorized representative who has indicated his/her understanding and acceptance.     Dental advisory given  Plan Discussed with: CRNA  Anesthesia Plan Comments: (Patient consented for risks of anesthesia including but not limited to:  - adverse reactions to medications - damage to eyes, teeth, lips or other oral mucosa - nerve damage due to positioning  - sore throat or hoarseness - damage to heart, brain, nerves, lungs, other parts of body or loss of life  Informed patient about role of CRNA in peri- and intra-operative care.  Patient voiced understanding.)         Anesthesia Quick Evaluation

## 2024-08-26 NOTE — Discharge Instructions (Signed)
 Hernia repair, Care After ?This sheet gives you information about how to care for yourself after your procedure. Your health care provider may also give you more specific instructions. If you have problems or questions, contact your health care provider. ?What can I expect after the procedure? ?After your procedure, it is common to have the following: ?Pain in your abdomen, especially in the incision areas. You will be given medicine to control the pain. ?Tiredness. This is a normal part of the recovery process. Your energy level will return to normal over the next several weeks. ?Changes in your bowel movements, such as constipation or needing to go more often. Talk with your health care provider about how to manage this. ?Follow these instructions at home: ?Medicines ? tylenol and advil as needed for discomfort.  Please alternate between the two every four hours as needed for pain.   ? Use narcotics, if prescribed, only when tylenol and motrin is not enough to control pain. ? 325-650mg  every 8hrs to max of 3000mg /24hrs (including the 325mg  in every norco dose) for the tylenol.   ? Advil up to 800mg  per dose every 8hrs as needed for pain.   ?PLEASE RECORD NUMBER OF PILLS TAKEN UNTIL NEXT FOLLOW UP APPT.  THIS WILL HELP DETERMINE HOW READY YOU ARE TO BE RELEASED FROM ANY ACTIVITY RESTRICTIONS ?Do not drive or use heavy machinery while taking prescription pain medicine. ?Do not drink alcohol while taking prescription pain medicine. ? ?Incision care ? ?  ?Follow instructions from your health care provider about how to take care of your incision areas. Make sure you: ?Keep your incisions clean and dry. ?Wash your hands with soap and water before and after applying medicine to the areas, and before and after changing your bandage (dressing). If soap and water are not available, use hand sanitizer. ?Change your dressing as told by your health care provider. ?Leave stitches (sutures), skin glue, or adhesive strips in  place. These skin closures may need to stay in place for 2 weeks or longer. If adhesive strip edges start to loosen and curl up, you may trim the loose edges. Do not remove adhesive strips completely unless your health care provider tells you to do that. ?Do not wear tight clothing over the incisions. Tight clothing may rub and irritate the incision areas, which may cause the incisions to open. ?Do not take baths, swim, or use a hot tub until your health care provider approves. OK TO SHOWER IN 24HRS.   ?Check your incision area every day for signs of infection. Check for: ?More redness, swelling, or pain. ?More fluid or blood. ?Warmth. ?Pus or a bad smell. ?Activity ?Avoid lifting anything that is heavier than 10 lb (4.5 kg) for 2 weeks or until your health care provider says it is okay. ?No pushing/pulling greater than 30lbs ?You may resume normal activities as told by your health care provider. Ask your health care provider what activities are safe for you. ?Take rest breaks during the day as needed. ?Eating and drinking ?Follow instructions from your health care provider about what you can eat after surgery. ?To prevent or treat constipation while you are taking prescription pain medicine, your health care provider may recommend that you: ?Drink enough fluid to keep your urine clear or pale yellow. ?Take over-the-counter or prescription medicines. ?Eat foods that are high in fiber, such as fresh fruits and vegetables, whole grains, and beans. ?Limit foods that are high in fat and processed sugars, such as fried and  sweet foods. ?General instructions ?Ask your health care provider when you will need an appointment to get your sutures or staples removed. ?Keep all follow-up visits as told by your health care provider. This is important. ?Contact a health care provider if: ?You have more redness, swelling, or pain around your incisions. ?You have more fluid or blood coming from the incisions. ?Your incisions feel  warm to the touch. ?You have pus or a bad smell coming from your incisions or your dressing. ?You have a fever. ?You have an incision that breaks open (edges not staying together) after sutures or staples have been removed. ?You develop a rash. ?You have chest pain or difficulty breathing. ?You have pain or swelling in your legs. ?You feel light-headed or you faint. ?Your abdomen swells (becomes distended). ?You have nausea or vomiting. ?You have blood in your stool (feces). ?This information is not intended to replace advice given to you by your health care provider. Make sure you discuss any questions you have with your health care provider. ?Document Released: 05/31/2005 Document Revised: 07/31/2018 Document Reviewed: 08/12/2016 ?Elsevier Interactive Patient Education ? 2019 Else[employer] ?  ? ?

## 2024-08-26 NOTE — Op Note (Signed)
 Preoperative diagnosis: right, recurrent, reducible, inguinal Hernia.  Postoperative diagnosis: same  Procedure: Robotic assisted laparoscopic right inguinal hernia repair with mesh  Anesthesia: General  Surgeon: Dr. Tye  Wound Classification: Clean  Specimen: none  Complications: None  Estimated Blood Loss: 10mL   Indications:  inguinal hernia. Repair was indicated to avoid complications of incarceration, obstruction and pain, and a prosthetic mesh repair was elected.  See H&P for further details.  Findings: 1. Vas Deferens and cord structures identified and preserved 2. Bard 3D max medium weight mesh used for repair 3. Adequate hemostasis achieved  Description of procedure: The patient was taken to the operating room. A time-out was completed verifying correct patient, procedure, site, positioning, and implant(s) and/or special equipment prior to beginning this procedure.  Area was prepped and draped in the usual sterile fashion. An incision was marked 20 cm above the pubic tubercle, slightly above the umbilicus  Scrotum wrapped in Kerlix roll.  Veress needle inserted at palmer's point.  Saline drop test noted to be positive with gradual increase in pressure after initiation of gas insufflation.  15 mm of pressure was achieved prior to removing the Veress needle and then placing a 8 mm port via the Optiview technique through the supraumbilical site.  Inspection of the area afterwards noted no injury to the surrounding organs during insertion of the needle and the port.  2 port sites were marked 8 cm to the lateral sides of the initial port, and a 8 mm robotic port was placed on the left side, another 8 mm robotic port on the right side under direct supervision.  Local anesthesia  infused to the preplanned incision sites prior to insertion of the port.  The [employer] platform was then brought into the operative field and docked to the ports.  Examination of the abdominal cavity  noted no obvious inguinal hernias.   Right side investigated further first due to more symptomatic nature. A peritoneal flap was created approximately 8cm cephalad to the defect by using scissors with electrocautery.  Dissection was carried down towards the pubic tubercle, developing the myopectineal orifice view.  Laterally the flap was carried towards the ASIS.  Portion of bladder was noted to be entering a defect medial to previously placed mehs. This carefully dissected away from the adjacent tissues to be fully reduced out of hernia cavity.  Any bleeding was controlled with combination of electrocautery and manual pressure.    After confirming adequate dissection and the peritoneal reflection completely down and away from the cord structures, a Large Bard 3DMax medium weight mesh was placed within the anterior abdominal wall, secured in place using 2-0 Vicryl on an SH needle immediately above the pubic tubercle.  After noting proper placement of the mesh with the peritoneal reflection deep to it, the previously created. peritoneal flap was secured back up to the anterior abdominal wall using running 3-0 V-Lock.  Both needles were then removed out of the abdominal cavity   Left side investigated next. A peritoneal flap was created approximately 8cm cephalad to the defect by using scissors with electrocautery.  Dissection was carried down towards the pubic tubercle, developing the myopectineal orifice view.  Laterally the flap was carried towards the ASIS. No hernia defect noted in abdominal wall, so decision made to proceed with closure. peritoneal flap was secured back up to the anterior abdominal wall using running 3-0 V-Lock.  Needle then removed out of the abdominal cavity, Xi platform undocked from the ports and removed  off of operative field.  Marcaine infused as ilioinguinal block on right side.  Abdomen then desufflated and ports removed. All the skin incisions were then closed with a  subcuticular stitch of Monocryl 4-0. Dermabond was applied. The testis was gently pulled down into its anatomic position in the scrotum.  The patient tolerated the procedure well and was taken to the postanesthesia care unit in stable condition. Sponge and instrument count correct at end of procedure.

## 2024-08-27 ENCOUNTER — Encounter: Payer: Self-pay | Admitting: Surgery

## 2024-09-01 ENCOUNTER — Ambulatory Visit: Admitting: Urology

## 2024-09-08 ENCOUNTER — Ambulatory Visit: Admitting: Urology

## 2024-09-09 ENCOUNTER — Other Ambulatory Visit: Payer: Self-pay | Admitting: Psychiatry

## 2024-09-09 DIAGNOSIS — G4701 Insomnia due to medical condition: Secondary | ICD-10-CM

## 2024-09-22 ENCOUNTER — Other Ambulatory Visit: Payer: Self-pay | Admitting: Surgery

## 2024-09-22 ENCOUNTER — Ambulatory Visit (INDEPENDENT_AMBULATORY_CARE_PROVIDER_SITE_OTHER): Admitting: Urology

## 2024-09-22 VITALS — BP 150/79 | HR 86 | Ht 67.0 in | Wt 170.0 lb

## 2024-09-22 DIAGNOSIS — N401 Enlarged prostate with lower urinary tract symptoms: Secondary | ICD-10-CM | POA: Diagnosis not present

## 2024-09-22 DIAGNOSIS — R972 Elevated prostate specific antigen [PSA]: Secondary | ICD-10-CM

## 2024-09-22 DIAGNOSIS — K4091 Unilateral inguinal hernia, without obstruction or gangrene, recurrent: Secondary | ICD-10-CM

## 2024-09-22 DIAGNOSIS — R3912 Poor urinary stream: Secondary | ICD-10-CM

## 2024-09-22 DIAGNOSIS — Z8719 Personal history of other diseases of the digestive system: Secondary | ICD-10-CM

## 2024-09-22 LAB — BLADDER SCAN AMB NON-IMAGING

## 2024-09-22 MED ORDER — ALFUZOSIN HCL ER 10 MG PO TB24: 10.0000 mg | ORAL_TABLET | Freq: Every day | ORAL | 11 refills | Status: DC

## 2024-09-22 NOTE — Progress Notes (Signed)
   09/22/2024 9:42 AM   Adrian Lewis 1946/01/16 [id number]  Reason for visit: Follow up BPH/LUTS, elevated PSA, nephrolithiasis, ED  History: Previously followed [facility], transferred his care to [facility] health urology August 2025 based on location Most recent ureteroscopy was 2022 PSA has been increasing, most recently 8.13 in May 2025, initially he had preferred more of a watchful waiting approach Urinary symptoms have acutely worsened over the last few months  Physical Exam: BP (!) 150/79 (BP Location: Left Arm, Patient Position: Sitting, Cuff Size: Normal)   Pulse 86   Ht 5' 7 (1.702 m)   Wt 170 lb (77.1 kg)   SpO2 97%   BMI 26.63 kg/m   Imaging/labs: PSA May 2025 8.13  Today: Continues to have bothersome urinary symptoms of weak stream, postvoid dribbling despite addition of Flomax , PVR today elevated at Interested in pursuing further evaluation for his urinary symptoms as well as elevated PSA at this point  Plan:   BPH/LUTS: Worsening symptoms and increasing PVRs despite Flomax .  Will trial alfuzosin and set up for cystoscopy for further evaluation, risks and benefits discussed Elevated PSA: Reviewed guidelines that do not recommend routine screening in men over age 34, however with his symptoms and plan for TRUS anyway for prostate evaluation, strongly recommended biopsy to differentiate between prostate cancer and BPH Trial of alfuzosin to replace Flomax , follow-up for cystoscopy and prostate biopsy   Adrian JAYSON Burnet, MD  [facility] [address] [place], [place] [address] [phone]

## 2024-09-22 NOTE — Patient Instructions (Addendum)
 Prostate Biopsy Instructions  Stop all aspirin or blood thinners (aspirin, plavix, coumadin, warfarin, motrin, ibuprofen, advil, aleve, naproxen, naprosyn) for 7 days prior to the procedure.  If you have any questions about stopping these medications, please contact your primary care physician or cardiologist.  Having a light meal prior to the procedure is recommended.  If you are diabetic or have low blood sugar please bring a small snack or glucose tablet.  A Fleets enema is needed to be purchased over the counter at a local pharmacy and used 2 hours before you scheduled appointment.  This can be purchased over the counter at any pharmacy.  Antibiotics will be administered in the clinic at the time of the procedure unless otherwise specified.    Please bring someone with you to the procedure to drive you home.  A follow up appointment has been scheduled for you to receive the results of the biopsy.  If you have any questions or concerns, please feel free to call the office at [phone] or send a Mychart message.    Thank you,  [facility]  Cystoscopy Cystoscopy is a procedure that is used to help diagnose and sometimes treat conditions that affect the lower urinary tract. The lower urinary tract includes the bladder and the urethra. The urethra is the tube that drains urine from the bladder. Cystoscopy is done using a thin, tube-shaped instrument with a light and camera at the end (cystoscope). The cystoscope may be hard or flexible, depending on the goal of the procedure. The cystoscope is inserted through the urethra, into the bladder. Cystoscopy may be recommended if you have: Urinary tract infections that keep coming back. Blood in the urine (hematuria). An inability to control when you urinate (urinary incontinence) or an overactive bladder. Unusual cells found in a urine sample. A blockage in the urethra, such as a urinary stone. Painful urination. An abnormality  in the bladder found during an intravenous pyelogram (IVP) or CT scan. What are the risks? Generally, this is a safe procedure. However, problems may occur, including: Infection. Bleeding.  What happens during the procedure?  You will be given one or more of the following: A medicine to numb the area (local anesthetic). The area around the opening of your urethra will be cleaned. The cystoscope will be passed through your urethra into your bladder. Germ-free (sterile) fluid will flow through the cystoscope to fill your bladder. The fluid will stretch your bladder so that your health care provider can clearly examine your bladder walls. Your doctor will look at the urethra and bladder. The cystoscope will be removed The procedure may vary among health care providers  What can I expect after the procedure? After the procedure, it is common to have: Some soreness or pain in your urethra. Urinary symptoms. These include: Mild pain or burning when you urinate. Pain should stop within a few minutes after you urinate. This may last for up to a few days after the procedure. A small amount of blood in your urine for several days. Feeling like you need to urinate but producing only a small amount of urine. Follow these instructions at home: General instructions Return to your normal activities as told by your health care provider.  Drink plenty of fluids after the procedure. Keep all follow-up visits as told by your health care provider. This is important. Contact a health care provider if you: Have pain that gets worse or does not get better with medicine, especially pain when  you urinate lasting longer than 72 hours after the procedure. Have trouble urinating. Get help right away if you: Have blood clots in your urine. Have a fever or chills. Are unable to urinate. Summary Cystoscopy is a procedure that is used to help diagnose and sometimes treat conditions that affect the lower urinary  tract. Cystoscopy is done using a thin, tube-shaped instrument with a light and camera at the end. After the procedure, it is common to have some soreness or pain in your urethra. It is normal to have blood in your urine after the procedure.  If you were prescribed an antibiotic medicine, take it as told by your health care provider.  This information is not intended to replace advice given to you by your health care provider. Make sure you discuss any questions you have with your health care provider. Document Revised: 11/03/2018 Document Reviewed: 11/03/2018 Elsevier Patient Education  2020 [employer].

## 2024-09-24 LAB — LITHIUM LEVEL: Lithium Lvl: 0.6 mmol/L (ref 0.5–1.2)

## 2024-09-28 ENCOUNTER — Ambulatory Visit
Admission: RE | Admit: 2024-09-28 | Discharge: 2024-09-28 | Disposition: A | Discharge status: Home / Self Care | Source: Ambulatory Visit | Attending: Surgery | Admitting: Surgery

## 2024-09-28 DIAGNOSIS — K4091 Unilateral inguinal hernia, without obstruction or gangrene, recurrent: Secondary | ICD-10-CM | POA: Diagnosis present

## 2024-09-28 DIAGNOSIS — Z9889 Other specified postprocedural states: Secondary | ICD-10-CM | POA: Insufficient documentation

## 2024-09-28 DIAGNOSIS — Z8719 Personal history of other diseases of the digestive system: Secondary | ICD-10-CM | POA: Diagnosis present

## 2024-09-28 MED ORDER — IOHEXOL 300 MG/ML  SOLN
100.0000 mL | Freq: Once | INTRAMUSCULAR | Status: AC | PRN
  Administered 2024-09-28: 100 mL via INTRAVENOUS

## 2024-09-28 MED ORDER — IOHEXOL 9 MG/ML PO SOLN
500.0000 mL | ORAL | Status: AC
  Administered 2024-09-28 (×2): 500 mL via ORAL

## 2024-10-03 ENCOUNTER — Other Ambulatory Visit: Payer: Self-pay | Admitting: Psychiatry

## 2024-10-03 DIAGNOSIS — F09 Unspecified mental disorder due to known physiological condition: Secondary | ICD-10-CM

## 2024-10-14 ENCOUNTER — Ambulatory Visit: Admitting: Internal Medicine

## 2024-10-14 ENCOUNTER — Encounter: Payer: Self-pay | Admitting: Internal Medicine

## 2024-10-14 VITALS — BP 130/80 | HR 83 | Temp 98.0°F | Ht 67.0 in | Wt 180.6 lb

## 2024-10-14 DIAGNOSIS — G4733 Obstructive sleep apnea (adult) (pediatric): Secondary | ICD-10-CM | POA: Diagnosis not present

## 2024-10-14 NOTE — Progress Notes (Signed)
 Name: PARKS CZAJKOWSKI MRN: [id number] DOB: 18-May-1946    CHIEF COMPLAINT:  Follow-up assessment for sleep apnea Patient has diagnosis of moderate sleep apnea AHI of 22 Previous CPAP download from January 2024 reports 100% compliance with AHI of 1.2     HISTORY OF PRESENT ILLNESS:  Discussed sleep data and reviewed with patient.  Encouraged proper weight management.  Discussed driving precautions and its relationship with hypersomnolence.  Discussed sleep hygiene, and benefits of a fixed sleep waked time.  The importance of getting eight or more hours of sleep discussed with patient.  Discussed limiting the use of the computer and television before bedtime.  Decrease naps during the day, so night time sleep will become enhanced.  Limit caffeine, and sleep deprivation.   Patient uses and benefits from therapy Using CPAP nightly and with naps Pressure setting is comfortable and is sleeping well. Vauto max inspiratory pressure 25 min expiratory pressure 6  Reduced to 2.1  No evidence of heart failure at this time No evidence or signs of infection at this time No respiratory distress No fevers, chills, nausea, vomiting, diarrhea No evidence of lower extremity edema No evidence hemoptysis     PAST MEDICAL HISTORY :   has a past medical history of Aortic atherosclerosis, Arthritis, Benign essential hypertension (12/19/2020), Bilateral carotid artery disease, Bilateral cataracts, Bilateral sciatica (10/07/2013), Bipolar disorder II, in full remission, most recent episode depressed (01/21/2022), BPH (benign prostatic hyperplasia), Cerebral microvascular disease, Circadian rhythm sleep disorder, 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), Coronary artery disease, Daily consumption of alcohol, Dysphonia, Generalized anxiety disorder (06/16/2017), GERD (gastroesophageal reflux disease), History of kidney stones, Hyperlipidemia, mixed (12/19/2020), Hypothyroidism, Incomplete RBBB, Inguinal  hernia, Insomnia due to medical condition (06/16/2017), 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  has a past surgical history that includes REPAIR RIGHT INGUINAL HERNIA W/ MESH (02/14/2006); Hammer toe surgery (2012  &  2010  (APPROX)); Cataract extraction w/ intraocular lens implant (2011 (APPROX)); Ureteroscopy (08/12/2012); Knee surgery (Left); Eye surgery; Inguinal hernia repair (Bilateral); Partial knee arthroplasty (Left, 2014); Septoplasty (N/A, 09/27/2015); Nasal turbinate reduction (Bilateral, 09/27/2015); Cystoscopy with retrograde pyelogram, ureteroscopy and stent placement (Bilateral, 02/23/2021); Holmium laser application (Bilateral, 02/23/2021); HEMITHYROIDECTOMY (Left, 06/26/2023); and XI Robotic assisted inguinal hernia repair with mesh (Right, 08/26/2024). Prior to Admission medications   Medication Sig Start Date End Date Taking? Authorizing Provider  Ascorbic Acid (VITAMIN C) 1000 MG tablet Take 1,000 mg by mouth daily.    [provider]  b complex vitamins capsule Take 1 capsule by mouth daily.    [provider]  buPROPion  (WELLBUTRIN  XL) 300 MG 24 hr tablet Take 1 tablet (300 mg total) by mouth daily. Stop Wellbutrin  SR 200 MG 04/01/23   Eappen, Saramma, MD  Coenzyme Q10 (COQ10 PO) Take 60 mg by mouth daily.    [provider]  ergocalciferol  (VITAMIN D2) 1.25 MG (50000 UT) capsule Take 50,000 Units by mouth 2 (two) times a week.    [provider]  eszopiclone  (LUNESTA ) 2 MG TABS tablet Take 2 mg by mouth at bedtime as needed  for sleep. Take immediately before bedtime    [provider]  lamoTRIgine  (LAMICTAL ) 100 MG tablet Take 1 tablet (100 mg total) by mouth daily.  04/01/23   Eappen, Saramma, MD  levothyroxine (SYNTHROID, LEVOTHROID) 25 MCG tablet Take 25 mcg by mouth Daily.  03/19/12   [provider]  lithium  carbonate (ESKALITH ) 450 MG ER tablet Take 1 tablet (450 mg total) by mouth 2 (two) times daily. Dose change 11/26/22   Eappen, Saramma, MD  magnesium oxide (MAG-OX) 400 MG tablet Take by mouth.    [provider]  Menaquinone-7 (VITAMIN K2) 100 MCG CAPS Take 1 capsule by mouth daily.    [provider]  Misc Natural Products (PROSTATE HEALTH PO) Take 1 capsule by mouth daily.    [provider]  Multiple Vitamins-Minerals (OCUVITE PRESERVISION PO) Take 1 tablet by mouth daily.    [provider]  Omega-3 Fatty Acids (FISH OIL) 1000 MG CAPS Take 1,000 mg by mouth daily.    [provider]  OVER THE COUNTER MEDICATION Take 1 capsule by mouth daily. Adrenogen    [provider]  potassium citrate (UROCIT-K) 10 MEQ (1080 MG) SR tablet 1 tablet with meals 12/23/12   [provider]  Probiotic Product (PROBIOTIC DAILY PO) Take 1 capsule by mouth daily.    [provider]  Quercetin 500 MG CAPS Take 1 capsule by mouth daily.    [provider]  RESVERATROL PO Take 500 mg by mouth daily.    [provider]  S-Adenosylmethionine (SAME) 400 MG TABS Take 400 mg by mouth daily.    [provider]  senna-docusate (SENOKOT-S) 8.6-50 MG tablet Take 1 tablet by mouth 2 (two) times daily. While taking strongest pain meds to prevent constipation. 02/23/21   Alvaro Ricardo KATHEE Mickey., MD  sodium fluoride (FLUORISHIELD) 1.1 % GEL dental gel Place onto teeth. 02/24/19   [provider]  telmisartan (MICARDIS) 20 MG tablet Take 40 mg by mouth daily. 10/15/22   [provider]  Tryptophan 500 MG CAPS Take 1  capsule by mouth at bedtime.    [provider]  zaleplon  (SONATA ) 10 MG capsule Take 1 capsule (10 mg total) by mouth at bedtime as needed for sleep. 02/13/23 05/14/23  Eappen, Saramma, MD   Allergies  Allergen Reactions   Bee Venom Anaphylaxis    Has EPI pen for this   Cat Dander Other (See Comments)    Per pt his nose runs/congestion.    Clonidine Other (See Comments)    fatigue   Grass Pollen(K-O-R-T-Swt Vern) Other (See Comments)    Runny nose    FAMILY HISTORY:  family history includes Dementia in his mother; Heart attack in his father; Parkinson's disease in his maternal aunt; Suicidality in his brother and sister. SOCIAL HISTORY:  reports that he has never smoked. He has never used smokeless tobacco. He reports current alcohol use of about 28.0 standard drinks of alcohol per week. He reports that he does not use drugs.  BP 130/80   Pulse 83   Temp 98 F (36.7 C)   Ht 5' 7 (1.702 m)   Wt 180 lb 9.6 oz (81.9 kg)   SpO2 96%   BMI 28.29 kg/m    Review of Systems  Constitutional: Negative.   Respiratory: Negative.    Cardiovascular: Negative.     Physical Exam Cardiovascular:     Rate and Rhythm: Normal rate and regular rhythm.     Pulses: Normal pulses.     Heart sounds: Normal heart sounds.  Pulmonary:     Effort: Pulmonary effort is normal.     Breath sounds: Normal  breath sounds.  Neurological:     General: No focal deficit present.     Mental Status: He is alert.      ASSESSMENT AND PLAN SYNOPSIS 78 year old male seen today for follow-up assessment for  sleep apnea with AHI of 22   Assessment of OSA Previous AHI 22 Excellent compliance report Reviewed compliance report in detail with patient Patient definitely benefits the use of CPAP therapy as prescribed Using CPAP nightly and with naps Pressure setting is comfortable and is sleeping well. BiPAP 25/6 PS 4 AHI reduced to 2  No evidence of acute heart failure at this time No  respiratory distress No fevers, chills, nausea, vomiting, diarrhea No evidence hemoptysis  Patient Instructions Continue to use CPAP every night, minimum of 4-6 hours a night.  Change equipment every 30 days or as directed by DME.  Wash your tubing with warm soap and water daily, hang to dry.  Wash humidifier portion weekly. Use bottled, distilled water and change daily  Risk of untreated sleep apnea including cardiac arrhthymias, stroke, DM, pulm HTN.   Deconditioned state -Recommend increased daily activity and exercise    MEDICATION ADJUSTMENTS/LABS AND TESTS ORDERED: Continue CPAP as prescribed DME company on details of possible new machine and bigger reservoir for distilled water    CURRENT MEDICATIONS REVIEWED AT LENGTH WITH PATIENT TODAY   Patient  satisfied with Plan of action and management. All questions answered   Follow up    I spent a total of 30 minutes dedicated to the care of this patient on the date of this encounter to include pre-visit review of records, face-to-face time with the patient discussing conditions above, post visit ordering of testing, clinical documentation with the electronic health record, making appropriate referrals as documented, and communicating necessary information to the patient's healthcare team.    The Patient requires high complexity decision making for assessment and support, frequent evaluation and titration of therapies, application of advanced monitoring technologies and extensive interpretation of multiple databases.  Patient satisfied with Plan of action and management. All questions answered    Nickolas Alm Cellar, M.D.  [facility]  Medical Director [facility] [place]

## 2024-10-14 NOTE — Patient Instructions (Signed)

## 2024-10-20 ENCOUNTER — Other Ambulatory Visit: Admitting: Urology

## 2024-10-27 ENCOUNTER — Ambulatory Visit: Admitting: Urology

## 2024-11-03 ENCOUNTER — Ambulatory Visit: Admitting: Urology

## 2024-11-03 VITALS — BP 147/76 | HR 82

## 2024-11-03 DIAGNOSIS — Z2989 Encounter for other specified prophylactic measures: Secondary | ICD-10-CM | POA: Diagnosis not present

## 2024-11-03 DIAGNOSIS — R339 Retention of urine, unspecified: Secondary | ICD-10-CM

## 2024-11-03 DIAGNOSIS — R972 Elevated prostate specific antigen [PSA]: Secondary | ICD-10-CM

## 2024-11-03 DIAGNOSIS — N139 Obstructive and reflux uropathy, unspecified: Secondary | ICD-10-CM | POA: Diagnosis not present

## 2024-11-03 DIAGNOSIS — R399 Unspecified symptoms and signs involving the genitourinary system: Secondary | ICD-10-CM

## 2024-11-03 DIAGNOSIS — C61 Malignant neoplasm of prostate: Secondary | ICD-10-CM | POA: Diagnosis not present

## 2024-11-03 DIAGNOSIS — N3943 Post-void dribbling: Secondary | ICD-10-CM | POA: Diagnosis not present

## 2024-11-03 DIAGNOSIS — R3912 Poor urinary stream: Secondary | ICD-10-CM

## 2024-11-03 MED ORDER — LIDOCAINE HCL URETHRAL/MUCOSAL 2 % EX GEL
1.0000 | Freq: Once | CUTANEOUS | Status: AC
  Administered 2024-11-03: 1 via URETHRAL

## 2024-11-03 MED ORDER — GENTAMICIN SULFATE 40 MG/ML IJ SOLN
80.0000 mg | Freq: Once | INTRAMUSCULAR | Status: AC
  Administered 2024-11-03: 80 mg via INTRAMUSCULAR

## 2024-11-03 MED ORDER — LEVOFLOXACIN 500 MG PO TABS
500.0000 mg | ORAL_TABLET | Freq: Once | ORAL | Status: AC
  Administered 2024-11-03: 500 mg via ORAL

## 2024-11-03 NOTE — Progress Notes (Signed)
° °  11/03/24   Cystoscopy Procedure Note:  Indication: Obstructive urinary symptoms, weak stream, postvoid dribbling, elevated PVR(2107ml)  After informed consent and discussion of the procedure and its risks, Adrian Lewis was positioned and prepped in the standard fashion. Cystoscopy was performed with a flexible cystoscope. The urethra, bladder neck and entire bladder was visualized in a standard fashion. The prostate was moderate in size with obstructive lateral lobes and elevated bladder neck. The ureteral orifices were visualized in their normal location and orientation.  Mild to moderate bladder trabeculations, distended bladder, no abnormalities on retroflexion  Findings: Enlarged prostate, obstructive lateral lobes   ----------------------------------------------------------------------------------------  Indication:   Prostate Biopsy Procedure   Informed consent was obtained, and we discussed the risks of bleeding and infection/sepsis. A time out was performed to ensure correct patient identity.  Pre-Procedure: - Last PSA Level: 7.75 - Gentamicin  and levaquin given for antibiotic prophylaxis - Transrectal Ultrasound performed revealing a 41 gm prostate - No significant hypoechoic or median lobe noted  Procedure: - Prostate block performed using 10 cc 1% lidocaine  and biopsies taken from sextant areas, a total of 12 under ultrasound guidance.  Post-Procedure: - Patient tolerated the procedure well - He was counseled to seek immediate medical attention if experiences significant bleeding, fevers, or severe pain - Return in one week to discuss biopsy results  Assessment/ Plan: Will follow up in 1-2 weeks to discuss pathology  --------------------------------------------------------------------------------  78 year old male with bothersome urinary symptoms with weak stream, postvoid dribbling despite alpha blockers, incomplete bladder emptying with PVR , and  increasing PSA up to 8.13.  Recent CT November 2025 showing 15 mm subtle right renal lesion, will order follow-up MRI in 6 months to confirm stability.  Prostate biopsy today shows a volume of 41 g, and cystoscopy shows obstructive appearing prostate.  If pathology benign or low-volume low risk disease likely would benefit from HOLEP to improve emptying and obstructive urinary symptoms  If intermediate to high risk disease, could still consider HOLEP as with his age of 24 high risk for worsening urinary symptoms with radiation  RTC 1 week to review pathology results, continue alfuzosin , will recheck PVR at follow-up  Redell Burnet, MD 11/03/2024

## 2024-11-03 NOTE — Addendum Note (Signed)
 Addended by: ELOUISE SANTA BROCKS on: 11/03/2024 11:58 AM   Modules accepted: Orders

## 2024-11-03 NOTE — Patient Instructions (Signed)

## 2024-11-05 LAB — PROSTATE CORE NEEDLE BIOPSY

## 2024-11-10 ENCOUNTER — Other Ambulatory Visit: Payer: Self-pay

## 2024-11-10 ENCOUNTER — Ambulatory Visit: Admitting: Urology

## 2024-11-10 VITALS — BP 172/76 | HR 91

## 2024-11-10 DIAGNOSIS — R3912 Poor urinary stream: Secondary | ICD-10-CM | POA: Diagnosis not present

## 2024-11-10 DIAGNOSIS — C61 Malignant neoplasm of prostate: Secondary | ICD-10-CM

## 2024-11-10 DIAGNOSIS — N401 Enlarged prostate with lower urinary tract symptoms: Secondary | ICD-10-CM

## 2024-11-10 LAB — BLADDER SCAN AMB NON-IMAGING

## 2024-11-10 NOTE — Progress Notes (Unsigned)
 Surgical Physician Order Form [facility]  Dr. Redell Burnet, MD  * Scheduling expectation : Next Available  *Length of Case: 30 minutes  *Clearance needed: no  *Anticoagulation Instructions: Hold all anticoagulants  *Aspirin Instructions: Hold Aspirin  *Post-op visit Date/Instructions: 4 to 6 weeks PVR  *Diagnosis: BPH w/LUTS  *Procedure: UroLift   Additional orders: N/A  -Admit type: OUTpatient  -Anesthesia: MAC  -VTE Prophylaxis Standing Order SCD's       Other:   -Standing Lab Orders Per Anesthesia    Lab other: UA&Urine Culture  -Standing Test orders EKG/Chest x-ray per Anesthesia       Test other:   - Medications:  Ancef  2gm IV  -Other orders:  N/A

## 2024-11-10 NOTE — Patient Instructions (Addendum)
 Please call [phone] or [phone] to schedule your imaging prior to your appointment.     Your biopsy did show high risk prostate cancer.  We will order a PET scan to evaluate for any prostate cancer outside the prostate.  In the setting of your urinary symptoms, I would recommend a UroLift to open the prostate and improve your urination prior to undergoing radiation for prostate cancer, as radiation can worsen your urinary symptoms.  We also typically prescribe a hormone medication that shrinks the prostate and helps the radiation worked better, and this can actually help improve some of the urinary symptoms 2.  We have placed a referral to radiation oncology, ordered a PSMA PET scan, and will reach out to schedule a UroLift procedure.

## 2024-11-10 NOTE — Progress Notes (Signed)
 11/10/2024 12:42 PM   Adrian Lewis November 14, 1946 [id number]  Reason for visit: Follow up review prostate biopsy results, obstructive urinary symptoms  History: Establish care with [facility] urology August 2025 after being followed at [facility] urology Primary bother has been obstructive urinary symptoms with some dribbling and weak stream, incontinence, has had some borderline PVRs PSA continued to increase and he opted for prostate biopsy and cystoscopy for urinary symptoms  Physical Exam: BP (!) 172/76 (BP Location: Left Arm, Patient Position: Sitting, Cuff Size: Normal)   Pulse 91   SpO2 99%   Imaging/labs: Prostate biopsy results reviewed, 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  PVR today On Flomax , previously trialed alfuzosin  with no improvement and went back to the Flomax .  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  CT abdomen and pelvis November 2025 with no evidence of metastatic disease, will order PSMA PET for complete staging. We had a lengthy conversation today about the patient's new diagnosis of prostate cancer.  We reviewed the risk classifications per the AUA guidelines including very low risk, low risk, intermediate risk, and high risk disease, and the need for additional staging imaging with CT and bone scan in patients with unfavorable intermediate risk and high risk disease.  I explained that his life expectancy, clinical stage, Gleason score, PSA, and other co-morbidities influence treatment strategies.  We discussed the roles of active surveillance, radiation therapy, surgical therapy with robotic  prostatectomy, and hormone therapy with androgen deprivation.  We discussed that patients urinary symptoms also impact treatment strategy, as patients with severe lower urinary tract symptoms may have significant worsening or even develop urinary retention after undergoing radiation. In regards to surgery, we discussed robotic prostatectomy +/- lymphadenectomy at length.  The procedure takes 3 to 4 hours, and patient's typically discharge home on post-op day #1.  A Foley catheter is left in place for 7 to 10 days to allow for healing of the vesicourethral anastomosis.  There is a small risk of bleeding, infection, damage to surrounding structures or bowel, hernia, DVT/PE, or serious cardiac or pulmonary complications.  We discussed at length post-op side effects including erectile dysfunction, and the importance of pre-operative erectile function on long-term outcomes.  Even with a nerve sparing approach, there is an approximately 25% rate of permanent erectile dysfunction.  We also discussed postop urinary incontinence at length.  We expect patients to have stress incontinence post-operatively that will improve over period of weeks to months.  Less than 10% of men will require a pad at 1 year after surgery.  Patients will need to avoid heavy lifting and strenuous activity for 3 to 4 weeks, but most men return to their baseline activity status by 6 weeks. BPH/LUTS: Likely has a component of obstructive symptoms from benign transition zone enlargement that have been present many years and acutely worsened, we also discussed that his obstructive symptoms may be related to prostate cancer as well He is not interested in any surgical options for prostate cancer, and with his age would not be a candidate regardless for robotic radical prostatectomy.  We discussed other options like watchful waiting or surveillance, but with his overall relatively good  health I think definitive treatment would be more reasonable.  I  would recommend pursuing UroLift prior to radiation, as there is possibility of exacerbating his urinary symptoms or even developing urinary retention.  We discussed that ADT can sometimes shrink the prostate and improve urinary symptoms, but typically takes longer. Schedule UroLift(should be completed prior to radiation), PSMA PET scan ordered, referral placed to radiation to consider XRT and ADT for high risk prostate cancer, risk, benefits, alternatives reviewed extensively with patient and wife  I spent 50 total minutes on the day of the encounter including pre-visit review of the medical record, face-to-face time with the patient, and post visit ordering of labs/imaging/tests.   Adrian JAYSON Burnet, MD  [facility] [address] [place], [place] [address] [phone]

## 2024-11-12 ENCOUNTER — Telehealth: Payer: Self-pay

## 2024-11-12 NOTE — Telephone Encounter (Signed)
 Per Dr. Francisca, Patient is to be scheduled for Cystoscopy with Insertion of Urolift   [name] was contacted and possible surgical dates were discussed, Monday January 19th, 2026 was agreed upon for surgery.   Patient was instructed that Dr. Francisca will require them to provide a pre-op UA & CX prior to surgery. This was ordered and scheduled drop off appointment was made for 11/29/2024.    Patient was directed to call [phone] between 1-3pm the day before surgery to find out surgical arrival time.  Instructions were given not to eat or drink from midnight on the night before surgery and have a driver for the day of surgery. On the surgery day patient was instructed to enter through the [facility] entrance of [facility] report the Same Day Surgery desk.   Pre-Admit Testing will be in contact via phone to set up an interview with the anesthesia team to review your history and medications prior to surgery.   Reminder of this information was sent via MyChart to the patient.

## 2024-11-12 NOTE — Progress Notes (Signed)
" ° °  [facility] Surgical Posting Form  Surgery Date: Date: 12/13/2024  Surgeon: Dr. Redell Burnet, MD  Inpt ( No  )   Outpt (Yes)   Obs ( No  )   Diagnosis: N40.1, N13.8 Benign Prostatic Hyperplasia with Urinary Obstruction  -CPT: 52441, [phone]  Surgery: Cystoscopy with Insertion of Urolift  Stop Anticoagulations: Yes and also hold ASA  Cardiac/Medical/Pulmonary Clearance needed: no  *Orders entered into EPIC  Date: 11/12/2024   *Case booked in [place]  Date: 11/12/2024  *Notified pt of Surgery: Date: 11/12/2024  PRE-OP UA & CX: yes, will obtain in clinic on 11/29/2024  *Placed into Prior Authorization Work [place] Date: 11/12/2024  Assistant/laser/rep:No                "

## 2024-11-19 ENCOUNTER — Other Ambulatory Visit

## 2024-11-23 ENCOUNTER — Ambulatory Visit
Admission: RE | Admit: 2024-11-23 | Discharge: 2024-11-23 | Disposition: A | Discharge status: Home / Self Care | Source: Ambulatory Visit | Attending: Urology | Admitting: Urology

## 2024-11-23 DIAGNOSIS — E042 Nontoxic multinodular goiter: Secondary | ICD-10-CM | POA: Insufficient documentation

## 2024-11-23 DIAGNOSIS — C61 Malignant neoplasm of prostate: Secondary | ICD-10-CM | POA: Insufficient documentation

## 2024-11-23 MED ORDER — FLOTUFOLASTAT F 18 GALLIUM 296-5846 MBQ/ML IV SOLN
8.8400 | Freq: Once | INTRAVENOUS | Status: AC
  Administered 2024-11-23: 8.84 via INTRAVENOUS

## 2024-11-29 ENCOUNTER — Other Ambulatory Visit

## 2024-11-29 DIAGNOSIS — F3176 Bipolar disorder, in full remission, most recent episode depressed: Secondary | ICD-10-CM

## 2024-11-29 DIAGNOSIS — N401 Enlarged prostate with lower urinary tract symptoms: Secondary | ICD-10-CM

## 2024-11-30 LAB — MICROSCOPIC EXAMINATION

## 2024-11-30 LAB — URINALYSIS, COMPLETE
Bilirubin, UA: NEGATIVE
Glucose, UA: NEGATIVE
Ketones, UA: NEGATIVE
Nitrite, UA: NEGATIVE
Protein,UA: NEGATIVE
RBC, UA: NEGATIVE
Specific Gravity, UA: 1.01 (ref 1.005–1.030)
Urobilinogen, Ur: 0.2 mg/dL (ref 0.2–1.0)
pH, UA: 6 (ref 5.0–7.5)

## 2024-12-02 ENCOUNTER — Ambulatory Visit: Payer: Self-pay | Admitting: Urology

## 2024-12-02 NOTE — Telephone Encounter (Signed)
 Appt scheduled

## 2024-12-03 LAB — CULTURE, URINE COMPREHENSIVE

## 2024-12-06 ENCOUNTER — Other Ambulatory Visit: Payer: Self-pay

## 2024-12-06 ENCOUNTER — Ambulatory Visit: Payer: Self-pay | Admitting: Urology

## 2024-12-06 ENCOUNTER — Encounter
Admission: RE | Admit: 2024-12-06 | Discharge: 2024-12-06 | Disposition: A | Discharge status: Home / Self Care | Source: Ambulatory Visit | Attending: Urology | Admitting: Urology

## 2024-12-06 MED ORDER — AMOXICILLIN 500 MG PO TABS: 500.0000 mg | ORAL_TABLET | Freq: Two times a day (BID) | ORAL | 0 refills | Status: DC

## 2024-12-06 NOTE — Telephone Encounter (Signed)
 Spoke with pt. And pt. Wife. Aware of need to start antibiotic. I have sent over the Rx to [facility] per patient request. Patient verbalized understanding.

## 2024-12-06 NOTE — Patient Instructions (Addendum)
 Your procedure is scheduled on: Monday 12/13/24 To find out your arrival time, please call [phone] between 1PM - 3PM on: Friday 12/10/24 Report to the Registration Desk on the 1st floor of the [facility]. If your arrival time is 6:00 am, do not arrive before that time as the [facility] entrance doors do not open until 6:00 am.  REMEMBER: Instructions that are not followed completely may result in serious medical risk, up to and including death; or upon the discretion of your surgeon and anesthesiologist your surgery may need to be rescheduled.  Do not eat food or drink any liquids after midnight the night before surgery.  No gum chewing or hard candies.  One week prior to surgery: Stop Anti-inflammatories (NSAIDS) such as Advil, Aleve, Ibuprofen, Motrin, Naproxen, Naprosyn and Aspirin based products such as Excedrin, Goody's Powder, BC Powder.  You may however, continue to take Tylenol  if needed for pain up until the day of surgery.  Stop ANY OVER THE COUNTER supplements and vitamins for at least 7 days until after surgery.  Continue taking all of your other prescription medications up until the day of surgery.  ON THE DAY OF SURGERY ONLY TAKE THESE MEDICATIONS WITH SIPS OF WATER:  alfuzosin  (UROXATRAL ) 10 MG 24 hr tablet  atorvastatin (LIPITOR) 20 MG tablet  buPROPion  (WELLBUTRIN  SR) 200 MG 12 hr tablet  lamoTRIgine  (LAMICTAL ) 100 MG tablet  levothyroxine (SYNTHROID, LEVOTHROID) 25 MCG tablet  lithium  carbonate (ESKALITH ) 450 MG ER tablet  omeprazole (PRILOSEC) 40 MG capsule   No Alcohol for 24 hours before or after surgery.  No Smoking including e-cigarettes for 24 hours before surgery.  No chewable tobacco products for at least 6 hours before surgery.  No nicotine patches on the day of surgery.  Do not use any recreational drugs for at least a week (preferably 2 weeks) before your surgery.  Please be advised that the combination of cocaine and anesthesia may have  negative outcomes, up to and including death. If you test positive for cocaine, your surgery will be cancelled.  On the morning of surgery brush your teeth with toothpaste and water, you may rinse your mouth with mouthwash if you wish. Do not swallow any toothpaste or mouthwash.  Do not shave body hair from the neck down 48 hours before surgery.  Do not wear lotions, powders, or perfumes.   Wear comfortable clothing (specific to your surgery type) to the hospital.  Do not wear jewelry, make-up, hairpins, clips or nail polish.  For welded (permanent) jewelry: bracelets, anklets, waist bands, etc.  Please have this removed prior to surgery.  If it is not removed, there is a chance that hospital personnel will need to cut it off on the day of surgery.  Contact lenses, hearing aids and dentures may not be worn into surgery.  Do not bring valuables to the hospital. [facility] is not responsible for any missing/lost belongings or valuables.   Bring your C-PAP to the hospital in case you may have to spend the night.   Notify your doctor if there is any change in your medical condition (cold, fever, infection).  After surgery, you can help prevent lung complications by doing breathing exercises.  Take deep breaths and cough every 1-2 hours. Your doctor may order a device called an Incentive Spirometer to help you take deep breaths.  If you are being discharged the day of surgery, you will not be allowed to drive home. You will need a responsible individual to  drive you home and stay with you for 24 hours after surgery.   Please call the Pre-admissions Testing Dept. at [phone] if you have any questions about these instructions.  Surgery Visitation Policy:  Patients having surgery or a procedure may have two visitors.  Children under the age of 33 must have an adult with them who is not the patient.  Merchandiser, Retail to address health-related social needs:   [email or web address]

## 2024-12-07 ENCOUNTER — Ambulatory Visit
Admission: RE | Admit: 2024-12-07 | Discharge: 2024-12-07 | Disposition: A | Discharge status: Home / Self Care | Source: Ambulatory Visit | Attending: Radiation Oncology | Admitting: Radiation Oncology

## 2024-12-07 ENCOUNTER — Encounter: Payer: Self-pay | Admitting: Radiation Oncology

## 2024-12-07 VITALS — BP 138/76 | HR 90 | Temp 98.3°F | Resp 16 | Wt 179.0 lb

## 2024-12-07 DIAGNOSIS — F3181 Bipolar II disorder: Secondary | ICD-10-CM | POA: Diagnosis not present

## 2024-12-07 DIAGNOSIS — E039 Hypothyroidism, unspecified: Secondary | ICD-10-CM | POA: Diagnosis not present

## 2024-12-07 DIAGNOSIS — E782 Mixed hyperlipidemia: Secondary | ICD-10-CM | POA: Diagnosis not present

## 2024-12-07 DIAGNOSIS — I10 Essential (primary) hypertension: Secondary | ICD-10-CM | POA: Diagnosis not present

## 2024-12-07 DIAGNOSIS — Z79899 Other long term (current) drug therapy: Secondary | ICD-10-CM | POA: Diagnosis not present

## 2024-12-07 DIAGNOSIS — G629 Polyneuropathy, unspecified: Secondary | ICD-10-CM | POA: Diagnosis not present

## 2024-12-07 DIAGNOSIS — G3184 Mild cognitive impairment, so stated: Secondary | ICD-10-CM | POA: Insufficient documentation

## 2024-12-07 DIAGNOSIS — K219 Gastro-esophageal reflux disease without esophagitis: Secondary | ICD-10-CM | POA: Diagnosis not present

## 2024-12-07 DIAGNOSIS — Z79890 Hormone replacement therapy (postmenopausal): Secondary | ICD-10-CM | POA: Diagnosis not present

## 2024-12-07 DIAGNOSIS — C61 Malignant neoplasm of prostate: Secondary | ICD-10-CM | POA: Diagnosis present

## 2024-12-07 DIAGNOSIS — M1990 Unspecified osteoarthritis, unspecified site: Secondary | ICD-10-CM | POA: Insufficient documentation

## 2024-12-07 DIAGNOSIS — Z87442 Personal history of urinary calculi: Secondary | ICD-10-CM | POA: Diagnosis not present

## 2024-12-07 DIAGNOSIS — M8580 Other specified disorders of bone density and structure, unspecified site: Secondary | ICD-10-CM | POA: Insufficient documentation

## 2024-12-07 DIAGNOSIS — I4510 Unspecified right bundle-branch block: Secondary | ICD-10-CM | POA: Insufficient documentation

## 2024-12-07 DIAGNOSIS — I2510 Atherosclerotic heart disease of native coronary artery without angina pectoris: Secondary | ICD-10-CM | POA: Insufficient documentation

## 2024-12-07 NOTE — Consult Note (Signed)
 " NEW PATIENT EVALUATION  Name: Adrian Lewis  MRN: [id number]  Date:   12/07/2024     DOB: 1946/01/15   This 79 y.o. 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  REFERRING PHYSICIAN: Georganne Penne SAUNDERS, MD  CHIEF COMPLAINT:  Chief Complaint  Patient presents with   Prostate Cancer    DIAGNOSIS: The encounter diagnosis was Malignant neoplasm of prostate (HCC).   PREVIOUS INVESTIGATIONS:  PSMA PET scan reviewed, CT scan of abdomen pelvis reviewed Pathology reports reviewed Clinical notes reviewed  HPI: Patient is a 79 year old male who presented with a rising PSA up to 7.7 back in November.  This prompted prostate biopsy showing 7 out of 12 cores a mixture of Gleason 8 (4+4) and Gleason 6 (3+3).  He had a CT scan back in November which showed no acute findings in the abdomen or pelvis.  PSMA PET scan was performed showing multiple foci of radiotracer uptake within the prostate consistent with multifocal prostate cancer no evidence of skeletal lymph node metastasis.  Patient is currently on Flomax  has nocturia x 1.  He is followed by urology for a component of obstructive symptoms although has been scheduled for UroLift early next week by Dr. Izell ski.  He is seen today for radiation oncology opinion.  He has no bowel problems no bone pain.  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), Coronary artery disease,  Daily consumption of alcohol, Dysphonia, Generalized anxiety disorder (06/16/2017), GERD (gastroesophageal reflux disease), History of kidney stones, Hyperlipidemia, mixed (12/19/2020), Hypothyroidism, Incomplete RBBB, Inguinal hernia, Insomnia due to medical condition (06/16/2017), 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    PAST SURGICAL HISTORY:  Past Surgical History:  Procedure Laterality Date   CATARACT EXTRACTION W/ INTRAOCULAR LENS IMPLANT  2011 (APPROX)   RIGHT EYE AND REPAIR DETACHED RETINA   CYSTOSCOPY WITH RETROGRADE PYELOGRAM, URETEROSCOPY AND STENT PLACEMENT Bilateral 02/23/2021   Procedure: CYSTOSCOPY WITH RETROGRADE PYELOGRAM, URETEROSCOPY AND STENT PLACEMENT;  Surgeon: Alvaro Hummer, MD;  Location: [facility];  Service: Urology;  Laterality: Bilateral;  75 MINS   EYE SURGERY     HAMMER TOE SURGERY  2012  &  2010  (APPROX)   ONE TOE , EACH FOOT   HEMITHYROIDECTOMY Left 06/26/2023   HOLMIUM LASER APPLICATION Bilateral 02/23/2021   Procedure: HOLMIUM LASER APPLICATION;  Surgeon: Alvaro Hummer, MD;  Location: [facility];  Service: Urology;  Laterality: Bilateral;   INGUINAL HERNIA REPAIR Bilateral    KNEE SURGERY Left    x 2  partial and reconstructive   NASAL TURBINATE REDUCTION Bilateral 09/27/2015   Procedure: TURBINATE REDUCTION/SUBMUCOSAL RESECTION;  Surgeon: Deward Dolly, MD;  Location: [facility];  Service: ENT;  Laterality: Bilateral;   PARTIAL KNEE ARTHROPLASTY Left 2014   REPAIR RIGHT INGUINAL HERNIA W/ MESH  02/14/2006  SEPTOPLASTY N/A 09/27/2015    Procedure: SEPTOPLASTY;  Surgeon: Deward Dolly, MD;  Location: [facility];  Service: ENT;  Laterality: N/A;   URETEROSCOPY  08/12/2012   Procedure: URETEROSCOPY;  Surgeon: Ricardo Likens, MD;  Location: [facility];  Service: Urology;  Laterality: Left;  1 HR    XI ROBOTIC ASSISTED INGUINAL HERNIA REPAIR WITH MESH Right 08/26/2024   Procedure: REPAIR, HERNIA, INGUINAL, ROBOT-ASSISTED, LAPAROSCOPIC, USING MESH;  Surgeon: Tye Millet, DO;  Location: [facility];  Service: General;  Laterality: Right;    FAMILY HISTORY: family history includes Dementia in his mother; Heart attack in his father; Parkinson's disease in his maternal aunt; Suicidality in his brother and sister.  SOCIAL HISTORY:  reports that he has never smoked. He has never used smokeless tobacco. He reports current alcohol use of about 28.0 standard drinks of alcohol per week. He reports that he does not use drugs.  ALLERGIES: Bee venom, Cat dander, Clonidine, and Grass pollen([id number]-o-r-t-swt vern)  MEDICATIONS:  Current Outpatient Medications  Medication Sig Dispense Refill   acetaminophen  (TYLENOL ) 500 MG tablet Take 1,000 mg by mouth every 6 (six) hours as needed for moderate pain (pain score 4-6).     alfuzosin  (UROXATRAL ) 10 MG 24 hr tablet Take 10 mg by mouth daily with breakfast.     amoxicillin  (AMOXIL ) 500 MG tablet Take 1 tablet (500 mg total) by mouth 2 (two) times daily. 10 tablet 0   Ascorbic Acid (VITAMIN C) 1000 MG tablet Take 1,000 mg by mouth daily.     atorvastatin (LIPITOR) 20 MG tablet Take 20 mg by mouth every morning.     b complex vitamins capsule Take 1 capsule by mouth daily.     buPROPion  (WELLBUTRIN  SR) 200 MG 12 hr tablet Take 1 tablet (200 mg total) by mouth in the morning. 90 tablet 1   busPIRone (BUSPAR) 15 MG tablet Take 7.5-15 mg by mouth daily as needed (anxiety).     Coenzyme Q10 (COQ10 PO) Take 60 mg by mouth daily.     cyanocobalamin (VITAMIN B12) 1000 MCG tablet Take 1,000 mcg by mouth  daily.     ipratropium (ATROVENT) 0.03 % nasal spray Place 1 spray into both nostrils every evening.     lamoTRIgine  (LAMICTAL ) 100 MG tablet TAKE 1 TABLET BY MOUTH DAILY 90 tablet 1   levothyroxine (SYNTHROID, LEVOTHROID) 25 MCG tablet Take 25 mcg by mouth daily before breakfast.     lithium  carbonate (ESKALITH ) 450 MG ER tablet Take 2 tablets (900 mg total) by mouth daily. (Patient taking differently: Take 450 mg by mouth 2 (two) times daily.) 180 tablet 1   magnesium oxide (MAG-OX) 400 MG tablet Take 400 mg by mouth daily.     Menaquinone-7 (VITAMIN K2) 100 MCG CAPS Take 100 mcg by mouth daily.     Misc Natural Products (PROSTATE HEALTH PO) Take 1 capsule by mouth daily.     Multiple Vitamins-Minerals (OCUVITE PRESERVISION PO) Take 1 tablet by mouth daily.     omeprazole (PRILOSEC) 40 MG capsule Take 40 mg by mouth every evening.     OVER THE COUNTER MEDICATION Take 1 capsule by mouth daily. Adrenogen     potassium citrate (UROCIT-K) 10 MEQ (1080 MG) SR tablet Take 10 mEq by mouth in the morning and at bedtime.     PREVIDENT 5000 PLUS 1.1 % CREA dental cream Place 1 Application onto teeth at bedtime.     Quercetin 500 MG CAPS Take 500 mg by mouth daily.  S-Adenosylmethionine (SAME) 400 MG TABS Take 400 mg by mouth daily.     Saw Palmetto, Serenoa repens, (SAW PALMETTO PO) Take 1 capsule by mouth daily.     telmisartan (MICARDIS) 40 MG tablet Take 40 mg by mouth every evening.     Tryptophan 500 MG CAPS Take 500 mg by mouth at bedtime.     zaleplon  (SONATA ) 10 MG capsule TAKE 1 CAPSULE BY MOUTH EVERY NIGHT AT BEDTIME AS NEEDED FOR SLEEP 30 capsule 5   No current facility-administered medications for this encounter.    ECOG PERFORMANCE STATUS:  0 - Asymptomatic  REVIEW OF SYSTEMS: Patient denies any weight loss, fatigue, weakness, fever, chills or night sweats. Patient denies any loss of vision, blurred vision. Patient denies any ringing  of the ears or hearing loss. No irregular  heartbeat. Patient denies heart murmur or history of fainting. Patient denies any chest pain or pain radiating to her upper extremities. Patient denies any shortness of breath, difficulty breathing at night, cough or hemoptysis. Patient denies any swelling in the lower legs. Patient denies any nausea vomiting, vomiting of blood, or coffee ground material in the vomitus. Patient denies any stomach pain. Patient states has had normal bowel movements no significant constipation or diarrhea. Patient denies any dysuria, hematuria or significant nocturia. Patient denies any problems walking, swelling in the joints or loss of balance. Patient denies any skin changes, loss of hair or loss of weight. Patient denies any excessive worrying or anxiety or significant depression. Patient denies any problems with insomnia. Patient denies excessive thirst, polyuria, polydipsia. Patient denies any swollen glands, patient denies easy bruising or easy bleeding. Patient denies any recent infections, allergies or URI. Patient s visual fields have not changed significantly in recent time.   PHYSICAL EXAM: BP 138/76   Pulse 90   Temp 98.3 F (36.8 C) (Tympanic)   Resp 16   Wt 179 lb (81.2 kg)   BMI 28.04 kg/m  Well-developed well-nourished patient in NAD. HEENT reveals PERLA, EOMI, discs not visualized.  Oral cavity is clear. No oral mucosal lesions are identified. Neck is clear without evidence of cervical or supraclavicular adenopathy. Lungs are clear to A&P. Cardiac examination is essentially unremarkable with regular rate and rhythm without murmur rub or thrill. Abdomen is benign with no organomegaly or masses noted. Motor sensory and DTR levels are equal and symmetric in the upper and lower extremities. Cranial nerves II through XII are grossly intact. Proprioception is intact. No peripheral adenopathy or edema is identified. No motor or sensory levels are noted. Crude visual fields are within normal range.  LABORATORY  DATA: Pathology reports reviewed compatible with above-stated findings    RADIOLOGY RESULTS: CT scan and PSMA PET scan reviewed compatible with above-stated findings   IMPRESSION: Stage IIc Gleason 8 (4+4) adenocarcinoma the prostate in 79 year old male  PLAN: At this time I have recommended image guided IMRT radiation therapy to his prostate.  Will plan on delivering 80 Gray over 8 weeks.  He is having a UroLift next week we will try to arrange for Dr. Quilla to place fiducial markers at that time.  Also would recommend 38-month depot of Eligard concurrently with radiation therapy.  Risks and benefits of treatment including increased lower urinary tract symptoms diarrhea fatigue alteration of blood counts all were reviewed with the patient and his family.  We will set up simulation after markers are placed.  Patient and family comprehend my treatment plan well.  I would like to take this  opportunity to thank you for allowing me to participate in the care of your patient.SABRA Marcey Penton, MD         "

## 2024-12-08 ENCOUNTER — Other Ambulatory Visit: Payer: Self-pay | Admitting: Urology

## 2024-12-08 ENCOUNTER — Encounter: Payer: Self-pay | Admitting: Urology

## 2024-12-08 DIAGNOSIS — R972 Elevated prostate specific antigen [PSA]: Secondary | ICD-10-CM

## 2024-12-10 ENCOUNTER — Telehealth: Payer: Self-pay

## 2024-12-10 NOTE — Telephone Encounter (Signed)
 Auth Submission: NO AUTH NEEDED Site of care: Urology Payer: Medicare A/B with supplement Medication & CPT/J Code(s) submitted: Eligard G7082 Diagnosis Code:  Route of submission (phone, fax, portal):  Phone # Fax # Auth type: Buy/Bill PB Units/visits requested: 45mg  x 2 doses Reference number:  Approval from: 12/10/24 to 12/25/25

## 2024-12-13 ENCOUNTER — Encounter: Payer: Self-pay | Admitting: *Deleted

## 2024-12-13 ENCOUNTER — Emergency Department
Admission: EM | Admit: 2024-12-13 | Discharge: 2024-12-14 | Disposition: A | Discharge status: Home / Self Care | Source: Home / Self Care | Attending: Emergency Medicine | Admitting: Emergency Medicine

## 2024-12-13 ENCOUNTER — Ambulatory Visit: Admitting: Certified Registered"

## 2024-12-13 ENCOUNTER — Ambulatory Visit
Admission: RE | Admit: 2024-12-13 | Discharge: 2024-12-13 | Disposition: A | Source: Ambulatory Visit | Attending: Urology | Admitting: Urology

## 2024-12-13 ENCOUNTER — Other Ambulatory Visit: Payer: Self-pay

## 2024-12-13 ENCOUNTER — Ambulatory Visit: Admission: RE | Admit: 2024-12-13 | Source: Ambulatory Visit

## 2024-12-13 ENCOUNTER — Encounter
Admission: RE | Disposition: A | Discharge status: Home / Self Care | Payer: Self-pay | Source: Home / Self Care | Attending: Urology

## 2024-12-13 ENCOUNTER — Encounter: Payer: Self-pay | Admitting: Urology

## 2024-12-13 ENCOUNTER — Ambulatory Visit
Admission: RE | Admit: 2024-12-13 | Discharge: 2024-12-13 | Disposition: A | Discharge status: Home / Self Care | Attending: Urology | Admitting: Urology

## 2024-12-13 DIAGNOSIS — R1030 Lower abdominal pain, unspecified: Secondary | ICD-10-CM | POA: Diagnosis not present

## 2024-12-13 DIAGNOSIS — R339 Retention of urine, unspecified: Secondary | ICD-10-CM | POA: Insufficient documentation

## 2024-12-13 DIAGNOSIS — N401 Enlarged prostate with lower urinary tract symptoms: Secondary | ICD-10-CM | POA: Diagnosis present

## 2024-12-13 DIAGNOSIS — N138 Other obstructive and reflux uropathy: Secondary | ICD-10-CM

## 2024-12-13 DIAGNOSIS — R310 Gross hematuria: Secondary | ICD-10-CM | POA: Insufficient documentation

## 2024-12-13 DIAGNOSIS — R110 Nausea: Secondary | ICD-10-CM | POA: Insufficient documentation

## 2024-12-13 DIAGNOSIS — R338 Other retention of urine: Secondary | ICD-10-CM | POA: Diagnosis not present

## 2024-12-13 DIAGNOSIS — D72829 Elevated white blood cell count, unspecified: Secondary | ICD-10-CM | POA: Insufficient documentation

## 2024-12-13 DIAGNOSIS — R972 Elevated prostate specific antigen [PSA]: Secondary | ICD-10-CM

## 2024-12-13 DIAGNOSIS — C61 Malignant neoplasm of prostate: Secondary | ICD-10-CM | POA: Insufficient documentation

## 2024-12-13 HISTORY — PX: CYSTOSCOPY WITH INSERTION OF UROLIFT: SHX6678

## 2024-12-13 HISTORY — PX: RADIOACTIVE SEED IMPLANT: SHX5150

## 2024-12-13 LAB — URINALYSIS, W/ REFLEX TO CULTURE (INFECTION SUSPECTED): Squamous Epithelial / HPF: 0 /HPF (ref 0–5)

## 2024-12-13 LAB — BASIC METABOLIC PANEL WITH GFR
Anion gap: 11 (ref 5–15)
BUN: 21 mg/dL (ref 8–23)
CO2: 21 mmol/L — ABNORMAL LOW (ref 22–32)
Calcium: 9.5 mg/dL (ref 8.9–10.3)
Chloride: 107 mmol/L (ref 98–111)
Creatinine, Ser: 1.27 mg/dL — ABNORMAL HIGH (ref 0.61–1.24)
GFR, Estimated: 57 mL/min — ABNORMAL LOW
Glucose, Bld: 164 mg/dL — ABNORMAL HIGH (ref 70–99)
Potassium: 5.1 mmol/L (ref 3.5–5.1)
Sodium: 139 mmol/L (ref 135–145)

## 2024-12-13 LAB — CBC
HCT: 42.3 % (ref 39.0–52.0)
Hemoglobin: 14.3 g/dL (ref 13.0–17.0)
MCH: 32.1 pg (ref 26.0–34.0)
MCHC: 33.8 g/dL (ref 30.0–36.0)
MCV: 94.8 fL (ref 80.0–100.0)
Platelets: 258 K/uL (ref 150–400)
RBC: 4.46 MIL/uL (ref 4.22–5.81)
RDW: 13.2 % (ref 11.5–15.5)
WBC: 11.6 K/uL — ABNORMAL HIGH (ref 4.0–10.5)
nRBC: 0 % (ref 0.0–0.2)

## 2024-12-13 MED ORDER — LIDOCAINE HCL (CARDIAC) PF 100 MG/5ML IV SOSY
PREFILLED_SYRINGE | INTRAVENOUS | Status: DC | PRN
  Administered 2024-12-13: 100 mg via INTRAVENOUS

## 2024-12-13 MED ORDER — DEXAMETHASONE SOD PHOSPHATE PF 10 MG/ML IJ SOLN
INTRAMUSCULAR | Status: DC | PRN
  Administered 2024-12-13: 10 mg via INTRAVENOUS

## 2024-12-13 MED ORDER — FENTANYL CITRATE (PF) 100 MCG/2ML IJ SOLN
INTRAMUSCULAR | Status: DC | PRN
  Administered 2024-12-13 (×2): 50 ug via INTRAVENOUS

## 2024-12-13 MED ORDER — TRAMADOL HCL 50 MG PO TABS: 25.0000 mg | ORAL_TABLET | Freq: Four times a day (QID) | ORAL | 0 refills | Status: AC | PRN

## 2024-12-13 MED ORDER — ROCURONIUM BROMIDE 100 MG/10ML IV SOLN
INTRAVENOUS | Status: DC | PRN
  Administered 2024-12-13: 10 mg via INTRAVENOUS
  Administered 2024-12-13: 40 mg via INTRAVENOUS

## 2024-12-13 MED ORDER — SUGAMMADEX SODIUM 200 MG/2ML IV SOLN
INTRAVENOUS | Status: DC | PRN
  Administered 2024-12-13: 200 mg via INTRAVENOUS

## 2024-12-13 MED ORDER — SUCCINYLCHOLINE CHLORIDE 200 MG/10ML IV SOSY
PREFILLED_SYRINGE | INTRAVENOUS | Status: DC | PRN
  Administered 2024-12-13: 100 mg via INTRAVENOUS

## 2024-12-13 MED ORDER — CEFAZOLIN SODIUM-DEXTROSE 2-4 GM/100ML-% IV SOLN
INTRAVENOUS | Status: AC
  Filled 2024-12-13: qty 100

## 2024-12-13 MED ORDER — ORAL CARE MOUTH RINSE: 15.0000 mL | Freq: Once | OROMUCOSAL | Status: AC

## 2024-12-13 MED ORDER — OXYCODONE-ACETAMINOPHEN 5-325 MG PO TABS
1.0000 | ORAL_TABLET | Freq: Once | ORAL | Status: AC
  Administered 2024-12-13: 1 via ORAL
  Filled 2024-12-13: qty 1

## 2024-12-13 MED ORDER — LACTATED RINGERS IV SOLN: INTRAVENOUS | Status: DC

## 2024-12-13 MED ORDER — PROPOFOL 10 MG/ML IV BOLUS
INTRAVENOUS | Status: DC | PRN
  Administered 2024-12-13: 180 mg via INTRAVENOUS

## 2024-12-13 MED ORDER — DEXMEDETOMIDINE HCL IN NACL 200 MCG/50ML IV SOLN
INTRAVENOUS | Status: DC | PRN
  Administered 2024-12-13: 12 ug via INTRAVENOUS

## 2024-12-13 MED ORDER — SODIUM CHLORIDE 0.9 % IR SOLN
Status: DC | PRN
  Administered 2024-12-13: 1

## 2024-12-13 MED ORDER — FENTANYL CITRATE (PF) 100 MCG/2ML IJ SOLN
INTRAMUSCULAR | Status: AC
  Filled 2024-12-13: qty 2

## 2024-12-13 MED ORDER — ACETAMINOPHEN 10 MG/ML IV SOLN
INTRAVENOUS | Status: DC | PRN
  Administered 2024-12-13: 1000 mg via INTRAVENOUS

## 2024-12-13 MED ORDER — GLYCOPYRROLATE 0.2 MG/ML IJ SOLN
INTRAMUSCULAR | Status: DC | PRN
  Administered 2024-12-13: .2 mg via INTRAVENOUS

## 2024-12-13 MED ORDER — CHLORHEXIDINE GLUCONATE 0.12 % MT SOLN
OROMUCOSAL | Status: AC
  Filled 2024-12-13: qty 15

## 2024-12-13 MED ORDER — CEFAZOLIN SODIUM-DEXTROSE 2-4 GM/100ML-% IV SOLN
2.0000 g | INTRAVENOUS | Status: AC
  Administered 2024-12-13: 2 g via INTRAVENOUS

## 2024-12-13 MED ORDER — ONDANSETRON HCL 4 MG/2ML IJ SOLN
INTRAMUSCULAR | Status: DC | PRN
  Administered 2024-12-13 (×2): 4 mg via INTRAVENOUS

## 2024-12-13 MED ORDER — CHLORHEXIDINE GLUCONATE 0.12 % MT SOLN
15.0000 mL | Freq: Once | OROMUCOSAL | Status: AC
  Administered 2024-12-13: 15 mL via OROMUCOSAL

## 2024-12-13 NOTE — Discharge Instructions (Signed)
 You were seen in the emergency department for urinary retention.  You had a Foley catheter placed with significant amount of bloody urine.  You do color interference so unable to tell if he had a urinary tract infection but a urine was sent for culture.  Follow-up with your urologist tomorrow.  Return for any ongoing or worsening symptoms.

## 2024-12-13 NOTE — Anesthesia Procedure Notes (Signed)
 Procedure Name: Intubation Date/Time: 12/13/2024 12:25 PM  Performed by: Ledora Duncan, CRNAPre-anesthesia Checklist: Patient identified, Emergency Drugs available, Suction available and Patient being monitored Patient Re-evaluated:Patient Re-evaluated prior to induction Oxygen Delivery Method: Circle system utilized Preoxygenation: Pre-oxygenation with 100% oxygen Induction Type: IV induction Ventilation: Mask ventilation without difficulty Laryngoscope Size: McGrath and 3 Grade View: Grade I Tube type: Oral Tube size: 7.0 mm Number of attempts: 1 Airway Equipment and Method: Stylet Placement Confirmation: ETT inserted through vocal cords under direct vision, positive ETCO2 and breath sounds checked- equal and bilateral Secured at: 21 cm Tube secured with: Tape Dental Injury: Teeth and Oropharynx as per pre-operative assessment

## 2024-12-13 NOTE — Op Note (Signed)
 Date of procedure: 12/13/24  Preoperative diagnosis:  Prostate cancer BPH  Postoperative diagnosis:  Same  Procedure: Transrectal ultrasound with fiducial gold seed marker placement UroLift(4 clips placed)  Surgeon: Redell Burnet, MD  Anesthesia: General  Complications: None  Intraoperative findings:  Transrectal ultrasound performed and fiducial markers placed, 2 bilaterally at the base, and 1 centrally at the apex Cystoscopy with moderate size prostate with obstructive lateral lobes, mild bladder trabeculations, no suspicious lesions Uncomplicated UroLift, 4 clips placed, wide open channel at conclusion procedure  EBL: Minimal  Specimens: None  Drains: None  Indication: Adrian Lewis is a 79 y.o. patient with high risk prostate cancer as well as BPH with obstructive symptoms and incomplete emptying, using shared decision making he opted for UroLift prior to undergoing radiation for prostate cancer.  After reviewing the management options for treatment, they elected to proceed with the above surgical procedure(s). We have discussed the potential benefits and risks of the procedure, side effects of the proposed treatment, the likelihood of the patient achieving the goals of the procedure, and any potential problems that might occur during the procedure or recuperation. Informed consent has been obtained.  Description of procedure:  The patient was taken to the operating room and general anesthesia was induced. SCDs were placed for DVT prophylaxis.  Preoperative antibiotics were administered, and a timeout was performed.  The patient was first placed on his left side and transrectal ultrasound inserted into the rectum.  Transrectal ultrasound guidance used to place 3 fiducial markers total, 2 bilaterally at the base, and 1 centrally at the apex.  The patient was then placed in the dorsal lithotomy position, prepped and draped in the usual sterile fashion.  The rigid UroLift  scope was used to intubate the urethra and followed towards the bladder.  Prostate was moderate in size with obstructive lateral lobes, bladder with mild trabeculations, uretera orifices orthotopic bilaterally but no other abnormal findings.  I started by placing a UroLift clip on the right side 1 cm distal to the bladder neck, identical UroLift clip was placed on the left side.  I then went back to the right side and a UroLift clip was placed just proximal to the verumontanum, and a final fourth UroLift clip placed on the left side directly across just proximal to the verumontanum.  With the visual obturator there was a wide open anterior channel into the bladder.  Minimal bleeding was noted.  Bladder was left partially distended.   Disposition: Stable to PACU  Plan: -Will need to void prior to discharge -Has clinic follow-up for ADT -Has radiation follow-up to start XRT -Can follow-up with me 3 months after completing radiation with PVR and PSA, sooner if problems -Would continue alfuzosin  2 weeks postop, okay to resume if worsening urinary symptoms or symptoms worsen during radiation  Redell Burnet, MD

## 2024-12-13 NOTE — H&P (Signed)
 "  12/13/24 12:16 PM   Adrian Lewis [birth year] [id number]  CC: Prostate cancer, BPH  HPI: 79 year old male with high risk prostate cancer with no evidence of metastatic disease as well as BPH with 40 g prostate and incomplete emptying with obstructive symptoms.  Planning for definitive treatment with radiation and ADT for prostate cancer, here today for UroLift to improve urinary symptoms prior to undergoing radiation.   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/2022   Coronary artery disease    Daily consumption of alcohol    Dysphonia    a.) secondary to cord paralysis/paresis following LEFT hemithyroidectomy   Generalized anxiety disorder 06/16/2017   GERD (gastroesophageal reflux disease)    History of kidney stones    Hyperlipidemia, mixed 12/19/2020   Hypothyroidism    Incomplete RBBB    Inguinal hernia    Insomnia due to medical condition 06/16/2017   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   Macular degeneration    Major depressive disorder    Medication overdose 06/16/2017   Mild cognitive impairment of uncertain or unknown etiology 01/01/2023   Nephrolithiasis 01/25/2020   OSA on CPAP    Osteopenia 04/04/2014   Paresthesia of both feet 12/03/2022   Peripheral neuropathy    Radiculopathy 02/24/2013   Rising PSA level 08/07/2021   Shortness of breath on exertion 12/19/2020   Tremor 03/19/2022   Vocal cord dysfunction    a.) paralysis LEFT and paresis RIGHT following hemithyroidectomy; working with speech therapy    Surgical History: Past  Surgical History:  Procedure Laterality Date   CATARACT EXTRACTION W/ INTRAOCULAR LENS IMPLANT  2011 (APPROX)   RIGHT EYE AND REPAIR DETACHED RETINA   CYSTOSCOPY WITH RETROGRADE PYELOGRAM, URETEROSCOPY AND STENT PLACEMENT Bilateral 02/23/2021   Procedure: CYSTOSCOPY WITH RETROGRADE PYELOGRAM, URETEROSCOPY AND STENT PLACEMENT;  Surgeon: Alvaro Hummer, MD;  Location: [facility];  Service: Urology;  Laterality: Bilateral;  75 MINS   EYE SURGERY     HAMMER TOE SURGERY  2012  &  2010  (APPROX)   ONE TOE , EACH FOOT   HEMITHYROIDECTOMY Left 06/26/2023   HOLMIUM LASER APPLICATION Bilateral 02/23/2021   Procedure: HOLMIUM LASER APPLICATION;  Surgeon: Alvaro Hummer, MD;  Location: [facility];  Service: Urology;  Laterality: Bilateral;   INGUINAL HERNIA REPAIR Bilateral    KNEE SURGERY Left    x 2  partial and reconstructive   NASAL TURBINATE REDUCTION Bilateral 09/27/2015   Procedure: TURBINATE REDUCTION/SUBMUCOSAL RESECTION;  Surgeon: Deward Dolly, MD;  Location: [facility];  Service: ENT;  Laterality: Bilateral;   PARTIAL KNEE ARTHROPLASTY Left 2014   REPAIR RIGHT INGUINAL HERNIA W/ MESH  02/14/2006   SEPTOPLASTY N/A 09/27/2015   Procedure: SEPTOPLASTY;  Surgeon: Deward Dolly, MD;  Location: [facility];  Service: ENT;  Laterality: N/A;   URETEROSCOPY  08/12/2012   Procedure: URETEROSCOPY;  Surgeon: Hummer Alvaro, MD;  Location: [facility];  Service: Urology;  Laterality: Left;  1 HR    XI ROBOTIC ASSISTED INGUINAL HERNIA REPAIR WITH MESH Right 08/26/2024   Procedure: REPAIR, HERNIA, INGUINAL, ROBOT-ASSISTED, LAPAROSCOPIC, USING MESH;  Surgeon: Tye Millet, DO;  Location: [facility];  Service: General;  Laterality: Right;     Family History: Family History  Problem Relation Age of Onset   Dementia Mother        with advanced age; ~27s   Heart attack Father    Suicidality Sister    Suicidality Brother    Parkinson's disease Maternal Aunt     Social History:  reports that he has  never smoked. He has never used smokeless tobacco. He reports current alcohol use of about 28.0 standard drinks of alcohol per week. He reports that he does not use drugs.  Physical Exam: BP (!) 140/86   Pulse 78   Temp 97.9 F (36.6 C) (Temporal)   Resp 18   SpO2 (!) 10%    Constitutional:  Alert and oriented, No acute distress. Cardiovascular: Regular rate and rhythm Respiratory: Clear to auscultation bilaterally GI: Abdomen is soft, nontender, nondistended, no abdominal masses   Laboratory Data: Preop culture 1/5 with very low growth Enterococcus, treated with appropriate antibiotics   Assessment & Plan:   79 year old male with high risk prostate cancer, no metastatic disease, as well as BPH and obstruction with incomplete emptying.  Here today for gold seed marker placement prior to XRT and ADT, as well as UroLift to improve urinary symptoms and emptying prior to radiation.  Risks and benefits discussed including bleeding, infection, persistent urinary symptoms, dysuria, recurrence, possible need for catheter placement.  Transrectal gold seed marker placement in your left  Redell Burnet, MD 12/13/2024  [facility] [address], [address] [place], [place] [address] [phone]   "

## 2024-12-13 NOTE — ED Triage Notes (Signed)
 Pt ambulatory to triage.  Pt had a urolift today and hasn't voided since surgery today.  Pt having increased pain .  Pt alert speech clear.

## 2024-12-13 NOTE — ED Provider Notes (Signed)
 "  [facility] Provider Note    Event Date/Time   First MD Initiated Contact with Patient 12/13/24 2037     (approximate)   History   Urinary Retention   HPI  Adrian Lewis is a 79 y.o. male presents to the emergency department for urinary retention.  Patient had a urologic procedure done earlier today.  Able to urinate a small amount of blood postprocedure but has not been able to urinate since that time.  Significant pain to his lower abdomen.  States that he has had some blood in his urine.  Complaining of some nausea.  Not on anticoagulation.  Also concerned he is constipated.     Physical Exam   Triage Vital Signs: ED Triage Vitals  Encounter Vitals Group     BP 12/13/24 2001 (!) 179/91     Girls Systolic BP Percentile --      Girls Diastolic BP Percentile --      Boys Systolic BP Percentile --      Boys Diastolic BP Percentile --      Pulse Rate 12/13/24 2001 (!) 102     Resp 12/13/24 2001 20     Temp 12/13/24 2001 (!) 97.5 F (36.4 C)     Temp Source 12/13/24 2001 Oral     SpO2 12/13/24 2001 95 %     Weight 12/13/24 1959 170 lb (77.1 kg)     Height 12/13/24 1959 5' 7 (1.702 m)     Head Circumference --      Peak Flow --      Pain Score 12/13/24 1958 9     Pain Loc --      Pain Education --      Exclude from Growth Chart --     Most recent vital signs: Vitals:   12/13/24 2001 12/13/24 2300  BP: (!) 179/91 (!) 145/81  Pulse: (!) 102 87  Resp: 20 18  Temp: (!) 97.5 F (36.4 C) 97.9 F (36.6 C)  SpO2: 95% 96%    Physical Exam Constitutional:      Appearance: He is well-developed.  HENT:     Head: Atraumatic.  Eyes:     Conjunctiva/sclera: Conjunctivae normal.  Cardiovascular:     Rate and Rhythm: Regular rhythm.  Pulmonary:     Effort: No respiratory distress.  Abdominal:     Tenderness: There is abdominal tenderness (Suprapubic abdominal tenderness to palpation).  Genitourinary:    Comments: No blood at the  meatus Musculoskeletal:     Cervical back: Normal range of motion.  Skin:    General: Skin is warm.  Neurological:     Mental Status: He is alert. Mental status is at baseline.     IMPRESSION / MDM / ASSESSMENT AND PLAN / ED COURSE  I reviewed the triage vital signs and the nursing notes.  Differential diagnosis including anemia, urinary retention, urinary tract infection  LABS (all labs ordered are listed, but only abnormal results are displayed) Labs interpreted as -    Labs Reviewed  URINALYSIS, W/ REFLEX TO CULTURE (INFECTION SUSPECTED) - Abnormal; Notable for the following components:      Result Value   Color, Urine RED (*)    APPearance CLOUDY (*)    Glucose, UA   (*)    Value: TEST NOT REPORTED DUE TO COLOR INTERFERENCE OF URINE PIGMENT   Hgb urine dipstick   (*)    Value: TEST NOT REPORTED DUE TO COLOR INTERFERENCE OF URINE  PIGMENT   Bilirubin Urine   (*)    Value: TEST NOT REPORTED DUE TO COLOR INTERFERENCE OF URINE PIGMENT   Ketones, ur   (*)    Value: TEST NOT REPORTED DUE TO COLOR INTERFERENCE OF URINE PIGMENT   Protein, ur   (*)    Value: TEST NOT REPORTED DUE TO COLOR INTERFERENCE OF URINE PIGMENT   Nitrite   (*)    Value: TEST NOT REPORTED DUE TO COLOR INTERFERENCE OF URINE PIGMENT   Leukocytes,Ua   (*)    Value: TEST NOT REPORTED DUE TO COLOR INTERFERENCE OF URINE PIGMENT   Bacteria, UA RARE (*)    Crystals PRESENT (*)    All other components within normal limits  BASIC METABOLIC PANEL WITH GFR - Abnormal; Notable for the following components:   CO2 21 (*)    Glucose, Bld 164 (*)    Creatinine, Ser 1.27 (*)    GFR, Estimated 57 (*)    All other components within normal limits  CBC - Abnormal; Notable for the following components:   WBC 11.6 (*)    All other components within normal limits  URINE CULTURE     MDM  Foley catheter placed with greater than 1 L of urine output that appears grossly bloody.  Mild leukocytosis.  Mild acute kidney  injury likely secondary to bladder obstruction.  Patient was given p.o. Percocet for pain control.  Urine with color interference but few bacteria.  Urine was sent for culture but will not start on antibiotic at this time.  On reevaluation patient had significant improvement of his pain.  Discussed close follow-up as an outpatient with urology.  Discharged home with Foley catheter in place.  No questions or concerns at time of discharge.  Discussed stool softener and MiraLAX.     PROCEDURES:  Critical Care performed: No  Procedures  Patient's presentation is most consistent with acute presentation with potential threat to life or bodily function.   MEDICATIONS ORDERED IN ED: Medications  oxyCODONE -acetaminophen  (PERCOCET/ROXICET) 5-325 MG per tablet 1 tablet (1 tablet Oral Given 12/13/24 2151)    FINAL CLINICAL IMPRESSION(S) / ED DIAGNOSES   Final diagnoses:  Urinary retention  Gross hematuria     Rx / DC Orders   ED Discharge Orders     None        Note:  This document was prepared using Dragon voice recognition software and may include unintentional dictation errors.   Suzanne Kirsch, MD 12/13/24 2314  "

## 2024-12-13 NOTE — Transfer of Care (Signed)
 Immediate Anesthesia Transfer of Care Note  Patient: Adrian Lewis  Procedure(s) Performed: CYSTOSCOPY WITH INSERTION OF UROLIFT INSERTION, RADIATION SOURCE, PROSTATE  Patient Location: PACU  Anesthesia Type:General  Level of Consciousness: awake, drowsy, and patient cooperative  Airway & Oxygen Therapy: Patient Spontanous Breathing and Patient connected to face mask oxygen  Post-op Assessment: Report given to RN and Post -op Vital signs reviewed and stable  Post vital signs: Reviewed and stable  Last Vitals:  Vitals Value Taken Time  BP 125/63 12/13/24 12:58  Temp    Pulse 75 12/13/24 13:00  Resp 15 12/13/24 13:00  SpO2 99 % 12/13/24 13:00  Vitals shown include unfiled device data.  Last Pain:  Vitals:   12/13/24 1112  TempSrc: Temporal  PainSc: 0-No pain         Complications: No notable events documented.

## 2024-12-13 NOTE — ED Notes (Signed)
 Bladder scan 524 mls

## 2024-12-13 NOTE — Anesthesia Postprocedure Evaluation (Signed)
"   Anesthesia Post Note  Patient: Adrian Lewis  Procedure(s) Performed: CYSTOSCOPY WITH INSERTION OF UROLIFT INSERTION, RADIATION SOURCE, PROSTATE  Patient location during evaluation: PACU Anesthesia Type: General Level of consciousness: awake Pain management: satisfactory to patient Vital Signs Assessment: post-procedure vital signs reviewed and stable Respiratory status: spontaneous breathing Cardiovascular status: stable Anesthetic complications: no   No notable events documented.   Last Vitals:  Vitals:   12/13/24 1112 12/13/24 1258  BP: (!) 140/86 125/63  Pulse: 78 78  Resp: 18 16  Temp: 36.6 C 36.6 C  SpO2: (!) 10% 98%    Last Pain:  Vitals:   12/13/24 1112  TempSrc: Temporal  PainSc: 0-No pain                 VAN STAVEREN,Jermaine Neuharth      "

## 2024-12-13 NOTE — Anesthesia Preprocedure Evaluation (Signed)
 "                                  Anesthesia Evaluation  Patient identified by MRN, date of birth, ID band Patient awake    Reviewed: Allergy & Precautions, NPO status , Patient's Chart, lab work & pertinent test results  Airway Mallampati: IV  TM Distance: >3 FB Neck ROM: full    Dental  (+) Teeth Intact   Pulmonary neg pulmonary ROS, sleep apnea and Continuous Positive Airway Pressure Ventilation    Pulmonary exam normal        Cardiovascular hypertension, Pt. on medications + CAD  negative cardio ROS Normal cardiovascular exam+ dysrhythmias  Rhythm:Regular Rate:Normal     Neuro/Psych   Anxiety  Bipolar Disorder   negative neurological ROS  negative psych ROS   GI/Hepatic negative GI ROS, Neg liver ROS,GERD  Medicated,,  Endo/Other  negative endocrine ROSHypothyroidism  Class 3 obesity  Renal/GU   negative genitourinary   Musculoskeletal  (+) Arthritis ,    Abdominal  (+) + obese  Peds negative pediatric ROS (+)  Hematology negative hematology ROS (+)   Anesthesia Other Findings Past Medical History: No date: Aortic atherosclerosis No date: Arthritis 12/19/2020: Benign essential hypertension No date: Bilateral carotid artery disease No date: Bilateral cataracts 10/07/2013: Bilateral sciatica 01/21/2022: Bipolar disorder II, in full remission, most recent  episode depressed No date: BPH (benign prostatic hyperplasia) No date: Cerebral microvascular disease 01/21/2022: Circadian rhythm sleep disorder, delayed sleep phase type No date: Coronary artery disease No date: Daily consumption of alcohol No date: Dysphonia     Comment:  a.) secondary to cord paralysis/paresis following LEFT               hemithyroidectomy 06/16/2017: Generalized anxiety disorder No date: GERD (gastroesophageal reflux disease) No date: History of kidney stones 12/19/2020: Hyperlipidemia, mixed No date: Hypothyroidism No date: Incomplete RBBB No date: Inguinal  hernia 06/16/2017: Insomnia due to medical condition 06/2023: Junctional bradycardia     Comment:  during hemi-thyroidectomy No date: Macular degeneration No date: Major depressive disorder 06/16/2017: Medication overdose 01/01/2023: Mild cognitive impairment of uncertain or unknown etiology 01/25/2020: Nephrolithiasis No date: OSA on CPAP 04/04/2014: Osteopenia 12/03/2022: Paresthesia of both feet No date: Peripheral neuropathy 02/24/2013: Radiculopathy 08/07/2021: Rising PSA level 12/19/2020: Shortness of breath on exertion 03/19/2022: Tremor No date: Vocal cord dysfunction     Comment:  a.) paralysis LEFT and paresis RIGHT following               hemithyroidectomy; working with speech therapy  Past Surgical History: 2011 (APPROX): CATARACT EXTRACTION W/ INTRAOCULAR LENS IMPLANT     Comment:  RIGHT EYE AND REPAIR DETACHED RETINA 02/23/2021: CYSTOSCOPY WITH RETROGRADE PYELOGRAM, URETEROSCOPY AND  STENT PLACEMENT; Bilateral     Comment:  Procedure: CYSTOSCOPY WITH RETROGRADE PYELOGRAM,               URETEROSCOPY AND STENT PLACEMENT;  Surgeon: Alvaro Hummer, MD;  Location: [facility];  Service: Urology;                Laterality: Bilateral;  75 MINS No date: EYE SURGERY 2012  &  2010  (APPROX): HAMMER TOE SURGERY     Comment:  ONE TOE , EACH FOOT 06/26/2023: HEMITHYROIDECTOMY; Left 02/23/2021: HOLMIUM LASER APPLICATION; Bilateral     Comment:  Procedure: HOLMIUM  LASER APPLICATION;  Surgeon: Alvaro Hummer, MD;  Location: [facility];  Service: Urology;                Laterality: Bilateral; No date: INGUINAL HERNIA REPAIR; Bilateral No date: KNEE SURGERY; Left     Comment:  x 2  partial and reconstructive 09/27/2015: NASAL TURBINATE REDUCTION; Bilateral     Comment:  Procedure: TURBINATE REDUCTION/SUBMUCOSAL RESECTION;                Surgeon: Deward Dolly, MD;  Location: [facility];  Service:              ENT;  Laterality: Bilateral; 2014: PARTIAL KNEE  ARTHROPLASTY; Left 02/14/2006: REPAIR RIGHT INGUINAL HERNIA W/ MESH 09/27/2015: COLENE; N/A     Comment:  Procedure: SEPTOPLASTY;  Surgeon: Deward Dolly, MD;                Location: [facility];  Service: ENT;  Laterality: N/A; 08/12/2012: URETEROSCOPY     Comment:  Procedure: URETEROSCOPY;  Surgeon: Hummer Alvaro, MD;                Location: [facility];  Service: Urology;               Laterality: Left;  1 HR 08/26/2024: XI ROBOTIC ASSISTED INGUINAL HERNIA REPAIR WITH MESH; Right     Comment:  Procedure: REPAIR, HERNIA, INGUINAL, ROBOT-ASSISTED,               LAPAROSCOPIC, USING MESH;  Surgeon: Tye Millet, DO;                Location: [facility];  Service: General;  Laterality:               Right;     Reproductive/Obstetrics negative OB ROS                              Anesthesia Physical Anesthesia Plan  ASA: 3  Anesthesia Plan: General   Post-op Pain Management:    Induction: Intravenous  PONV Risk Score and Plan: Ondansetron , Dexamethasone , Midazolam  and Treatment may vary due to age or medical condition  Airway Management Planned: Oral ETT  Additional Equipment:   Intra-op Plan:   Post-operative Plan: Extubation in OR  Informed Consent: I have reviewed the patients History and Physical, chart, labs and discussed the procedure including the risks, benefits and alternatives for the proposed anesthesia with the patient or authorized representative who has indicated his/her understanding and acceptance.     Dental Advisory Given  Plan Discussed with: Anesthesiologist  Anesthesia Plan Comments:         Anesthesia Quick Evaluation  "

## 2024-12-14 ENCOUNTER — Telehealth: Payer: Self-pay

## 2024-12-14 ENCOUNTER — Encounter: Payer: Self-pay | Admitting: Urology

## 2024-12-14 ENCOUNTER — Emergency Department
Admission: EM | Admit: 2024-12-14 | Discharge: 2024-12-14 | Disposition: A | Discharge status: Home / Self Care | Attending: Emergency Medicine | Admitting: Emergency Medicine

## 2024-12-14 ENCOUNTER — Other Ambulatory Visit: Payer: Self-pay

## 2024-12-14 ENCOUNTER — Ambulatory Visit (INDEPENDENT_AMBULATORY_CARE_PROVIDER_SITE_OTHER): Admitting: Physician Assistant

## 2024-12-14 VITALS — BP 150/72 | HR 99 | Ht 67.0 in | Wt 178.2 lb

## 2024-12-14 DIAGNOSIS — Y732 Prosthetic and other implants, materials and accessory gastroenterology and urology devices associated with adverse incidents: Secondary | ICD-10-CM | POA: Insufficient documentation

## 2024-12-14 DIAGNOSIS — N138 Other obstructive and reflux uropathy: Secondary | ICD-10-CM

## 2024-12-14 DIAGNOSIS — Z8546 Personal history of malignant neoplasm of prostate: Secondary | ICD-10-CM | POA: Diagnosis not present

## 2024-12-14 DIAGNOSIS — T839XXA Unspecified complication of genitourinary prosthetic device, implant and graft, initial encounter: Secondary | ICD-10-CM

## 2024-12-14 DIAGNOSIS — N401 Enlarged prostate with lower urinary tract symptoms: Secondary | ICD-10-CM | POA: Diagnosis not present

## 2024-12-14 DIAGNOSIS — T83098A Other mechanical complication of other indwelling urethral catheter, initial encounter: Secondary | ICD-10-CM | POA: Insufficient documentation

## 2024-12-14 DIAGNOSIS — R338 Other retention of urine: Secondary | ICD-10-CM | POA: Diagnosis not present

## 2024-12-14 LAB — BLADDER SCAN AMB NON-IMAGING

## 2024-12-14 MED ORDER — LIDOCAINE HCL URETHRAL/MUCOSAL 2 % EX GEL
1.0000 | Freq: Once | CUTANEOUS | Status: AC
  Administered 2024-12-14: 1 via URETHRAL

## 2024-12-14 MED ORDER — LIDOCAINE HCL URETHRAL/MUCOSAL 2 % EX GEL
CUTANEOUS | Status: AC
  Filled 2024-12-14: qty 6

## 2024-12-14 NOTE — ED Triage Notes (Signed)
 Pt had a urolift yesterday and has a catheter in place from last night and then went to the urologist who irrigated his catheter with a lot of blood clots and pt foley is now clogged again.

## 2024-12-14 NOTE — ED Provider Notes (Signed)
 "  [facility] Provider Note    Event Date/Time   First MD Initiated Contact with Patient 12/14/24 2056     (approximate)   History   Hematuria   HPI  Adrian Lewis is a 79 y.o. male with a history of prostate cancer and BPH who presents with concern for clogging of his Foley catheter.  The patient was just here in the ED last night, and then went to urology earlier today and had the Foley irrigated.  However, it seems to have clogged back up.  He now is having some urine flowing.  The urine is yellow in color and clear.  There is no blood.  However he states that the urologist told him he may do better with an increased size of the Foley as well as the full-size bag instead of the small leg bag.  The patient denies any abdominal pain.  He has no fever or chills.  I reviewed the past medical records.  The patient had a transrectal ultrasound with seed placement and UroLift yesterday by Dr. Quilla.  Subsequently he was seen in the ED yesterday evening with urinary retention.  Foley catheter was placed which showed gross hematuria.  The patient saw urology today.  There was clot retention and the bladder was irrigated.  The urine subsequently cleared.   Physical Exam   Triage Vital Signs: ED Triage Vitals  Encounter Vitals Group     BP 12/14/24 1813 (!) 173/93     Girls Systolic BP Percentile --      Girls Diastolic BP Percentile --      Boys Systolic BP Percentile --      Boys Diastolic BP Percentile --      Pulse Rate 12/14/24 1813 89     Resp 12/14/24 1813 18     Temp 12/14/24 1813 97.6 F (36.4 C)     Temp Source 12/14/24 1813 Oral     SpO2 12/14/24 1813 94 %     Weight 12/14/24 1814 170 lb (77.1 kg)     Height 12/14/24 1814 5' 7 (1.702 m)     Head Circumference --      Peak Flow --      Pain Score 12/14/24 1833 6     Pain Loc --      Pain Education --      Exclude from Growth Chart --     Most recent vital signs: Vitals:   12/14/24 1813   BP: (!) 173/93  Pulse: 89  Resp: 18  Temp: 97.6 F (36.4 C)  SpO2: 94%     General: Alert, comfortable appearing, no distress.  CV:  Good peripheral perfusion.  Resp:  Normal effort.  Abd:  No distention.  Other:  Foley catheter in place.  Leg bag with clear yellow urine.   ED Results / Procedures / Treatments   Labs (all labs ordered are listed, but only abnormal results are displayed) Labs Reviewed - No data to display   EKG   RADIOLOGY   PROCEDURES:  Critical Care performed: No  Procedures   MEDICATIONS ORDERED IN ED: Medications  lidocaine  (XYLOCAINE ) 2 % jelly 1 Application (1 Application Urethral Given 12/14/24 2214)     IMPRESSION / MDM / ASSESSMENT AND PLAN / ED COURSE  I reviewed the triage vital signs and the nursing notes.  79 year old male with PMH as noted above presents with recurrent clogging of his Foley catheter although does not appear to be in  urinary retention now.  Differential diagnosis includes, but is not limited to, urine clot retention, Foley catheter malfunction.  Patient's presentation is most consistent with acute, uncomplicated illness.  We will replace the Foley and reassess.  ----------------------------------------- 11:04 PM on 12/14/2024 -----------------------------------------  Foley catheter changed to an 62 French.  The patient tolerated this well.  The new Foley is draining easily into a large bag.  The patient is stable for discharge home at this time.  Return precautions given, and he expressed understanding.   FINAL CLINICAL IMPRESSION(S) / ED DIAGNOSES   Final diagnoses:  Problem with Foley catheter, initial encounter     Rx / DC Orders   ED Discharge Orders     None        Note:  This document was prepared using Dragon voice recognition software and may include unintentional dictation errors.    Jacolyn Pae, MD 12/14/24 2305  "

## 2024-12-14 NOTE — ED Notes (Signed)
 RN attempted to irrigate foley in triage. RN was able to get of urine out with gravity. RN saw a very large blood clot that was at the tip of the foley. RN unable to get clot out of foley. RN irrigated foley again and urine was returned and drained however pt does have many blood clots.

## 2024-12-14 NOTE — Discharge Instructions (Signed)
 Call the urology office tomorrow.  Return to the ER for new, worsening, or persistent symptoms including recurrent clogging of the Foley, blood in the urine, abdominal pain, fever, or any other new or worsening symptoms that concern you.

## 2024-12-14 NOTE — Progress Notes (Signed)
 "  12/14/2024 2:30 PM   Adrian Gamblin Lewis Mar 08, 1946 [id number]  CC: Chief Complaint  Patient presents with   Urinary Retention   HPI: Adrian Lewis is a 79 y.o. male with PMH prostate cancer and BPH who underwent UroLift with gold seed placement with Dr. Francisca yesterday who presents today for evaluation of nondraining Foley catheter.  He initially voided postop and was discharged from the PACU, however he returned to the emergency department yesterday evening after he developed difficulty voiding.  A Foley catheter was placed with drainage of >1L of bloody urine.  He noticed a significant decrease in urinary drainage into his catheter bag several hours ago and is having some abdominal discomfort.  Bladder scan on arrival 33 mL.  Foley catheter in place with scant dark red urine in the bag, tubing dry.   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/2022   Coronary artery disease    Daily consumption of alcohol    Dysphonia    a.) secondary to cord paralysis/paresis following LEFT hemithyroidectomy   Generalized anxiety disorder 06/16/2017   GERD (gastroesophageal reflux disease)    History of kidney stones    Hyperlipidemia, mixed 12/19/2020   Hypothyroidism    Incomplete RBBB    Inguinal hernia    Insomnia due to medical condition 06/16/2017   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   Macular degeneration    Major depressive disorder    Medication overdose 06/16/2017   Mild cognitive impairment of uncertain or unknown etiology 01/01/2023   Nephrolithiasis  01/25/2020   OSA on CPAP    Osteopenia 04/04/2014   Paresthesia of both feet 12/03/2022   Peripheral neuropathy    Radiculopathy 02/24/2013   Rising PSA level 08/07/2021   Shortness of breath on exertion 12/19/2020   Tremor 03/19/2022   Vocal cord dysfunction    a.) paralysis LEFT and paresis RIGHT following hemithyroidectomy; working with speech therapy    Surgical History: Past Surgical History:  Procedure Laterality Date   CATARACT EXTRACTION W/ INTRAOCULAR LENS IMPLANT  2011 (APPROX)   RIGHT EYE AND REPAIR DETACHED RETINA   CYSTOSCOPY WITH INSERTION OF UROLIFT N/A 12/13/2024   Procedure: CYSTOSCOPY WITH INSERTION OF UROLIFT;  Surgeon: Francisca Redell BROCKS, MD;  Location: [facility];  Service: Urology;  Laterality: N/A;  4  implanted   CYSTOSCOPY WITH RETROGRADE PYELOGRAM, URETEROSCOPY AND STENT PLACEMENT Bilateral 02/23/2021   Procedure: CYSTOSCOPY WITH RETROGRADE PYELOGRAM, URETEROSCOPY AND STENT PLACEMENT;  Surgeon: Alvaro Hummer, MD;  Location: [facility];  Service: Urology;  Laterality: Bilateral;  75 MINS   EYE SURGERY     HAMMER TOE SURGERY  2012  &  2010  (APPROX)   ONE TOE , EACH FOOT   HEMITHYROIDECTOMY Left 06/26/2023   HOLMIUM LASER APPLICATION Bilateral 02/23/2021   Procedure: HOLMIUM LASER APPLICATION;  Surgeon: Alvaro Hummer, MD;  Location: [facility];  Service: Urology;  Laterality: Bilateral;   INGUINAL HERNIA REPAIR Bilateral    KNEE SURGERY Left    x 2  partial and reconstructive   NASAL TURBINATE REDUCTION Bilateral 09/27/2015   Procedure: MONTA  REDUCTION/SUBMUCOSAL RESECTION;  Surgeon: Deward Dolly, MD;  Location: [facility];  Service: ENT;  Laterality: Bilateral;   PARTIAL KNEE ARTHROPLASTY Left 2014   RADIOACTIVE SEED IMPLANT N/A 12/13/2024   Procedure: INSERTION, RADIATION SOURCE, PROSTATE;  Surgeon: Francisca Redell BROCKS, MD;  Location: [facility];  Service: Urology;  Laterality: N/A;   REPAIR RIGHT INGUINAL HERNIA W/ MESH  02/14/2006   SEPTOPLASTY N/A 09/27/2015    Procedure: SEPTOPLASTY;  Surgeon: Deward Dolly, MD;  Location: [facility];  Service: ENT;  Laterality: N/A;   URETEROSCOPY  08/12/2012   Procedure: URETEROSCOPY;  Surgeon: Ricardo Likens, MD;  Location: [facility];  Service: Urology;  Laterality: Left;  1 HR    XI ROBOTIC ASSISTED INGUINAL HERNIA REPAIR WITH MESH Right 08/26/2024   Procedure: REPAIR, HERNIA, INGUINAL, ROBOT-ASSISTED, LAPAROSCOPIC, USING MESH;  Surgeon: Tye Millet, DO;  Location: [facility];  Service: General;  Laterality: Right;    Home Medications:  Allergies as of 12/14/2024       Reactions   Bee Venom Anaphylaxis   Has EPI pen for this   Cat Dander Other (See Comments)   Per pt his nose runs/congestion.    Clonidine Other (See Comments)   fatigue   Grass Pollen(k-o-r-t-swt Vern) Other (See Comments)   Runny nose        Medication List        Accurate as of December 14, 2024  2:30 PM. If you have any questions, ask your nurse or doctor.          STOP taking these medications    zaleplon  10 MG capsule Commonly known as: SONATA  Stopped by: Lucie Hones       TAKE these medications    acetaminophen  500 MG tablet Commonly known as: TYLENOL  Take 1,000 mg by mouth every 6 (six) hours as needed for moderate pain (pain score 4-6).   alfuzosin  10 MG 24 hr tablet Commonly known as: UROXATRAL  Take 10 mg by mouth daily with breakfast.   amoxicillin  500 MG tablet Commonly known as: AMOXIL  Take 1 tablet (500 mg total) by mouth 2 (two) times daily.   amoxicillin  500 MG capsule Commonly known as: AMOXIL  Take 500 mg by mouth 2 (two) times daily.   atorvastatin 20 MG tablet Commonly known as: LIPITOR Take 20 mg by mouth every morning.   b complex vitamins capsule Take 1 capsule by mouth daily.   buPROPion  200 MG 12 hr tablet Commonly known as: Wellbutrin  SR Take 1 tablet (200 mg total) by mouth in the morning.   busPIRone 15 MG tablet Commonly known as: BUSPAR Take 7.5-15  mg by mouth daily as needed (anxiety).   COQ10 PO Take 60 mg by mouth daily.   cyanocobalamin 1000 MCG tablet Commonly known as: VITAMIN B12 Take 1,000 mcg by mouth daily.   ipratropium 0.03 % nasal spray Commonly known as: ATROVENT Place 1 spray into both nostrils every evening.   lamoTRIgine  100 MG tablet Commonly known as: LAMICTAL  TAKE 1 TABLET BY MOUTH DAILY   levothyroxine 25 MCG tablet Commonly known as: SYNTHROID Take 25 mcg by mouth daily before breakfast.   lithium  carbonate 450 MG ER tablet Commonly known as: ESKALITH  Take 2 tablets (900 mg total) by mouth daily. What changed:  how much to take when to take this   magnesium oxide 400 MG tablet Commonly known as: MAG-OX Take 400 mg by mouth daily.   OCUVITE PRESERVISION PO Take 1 tablet by mouth daily.   omeprazole 40 MG  capsule Commonly known as: PRILOSEC Take 40 mg by mouth every evening.   OVER THE COUNTER MEDICATION Take 1 capsule by mouth daily. Adrenogen   potassium citrate 10 MEQ (1080 MG) SR tablet Commonly known as: UROCIT-K Take 10 mEq by mouth in the morning and at bedtime.   PreviDent 5000 Plus 1.1 % Crea dental cream Generic drug: sodium fluoride Place 1 Application onto teeth at bedtime.   PROSTATE HEALTH PO Take 1 capsule by mouth daily.   Quercetin 500 MG Caps Take 500 mg by mouth daily.   SAMe 400 MG Tabs Take 400 mg by mouth daily.   SAW PALMETTO PO Take 1 capsule by mouth daily.   telmisartan 40 MG tablet Commonly known as: MICARDIS Take 40 mg by mouth every evening.   traMADol  50 MG tablet Commonly known as: Ultram  Take 0.5-1 tablets (25-50 mg total) by mouth every 6 (six) hours as needed for up to 3 days for severe pain (pain score 7-10).   Tryptophan 500 MG Caps Take 500 mg by mouth at bedtime.   vitamin C 1000 MG tablet Take 1,000 mg by mouth daily.   Vitamin K2 100 MCG Caps Take 100 mcg by mouth daily.        Allergies:  Allergies[1]  Family  History: Family History  Problem Relation Age of Onset   Dementia Mother        with advanced age; ~64s   Heart attack Father    Suicidality Sister    Suicidality Brother    Parkinson's disease Maternal Aunt     Social History:   reports that he has never smoked. He has never used smokeless tobacco. He reports current alcohol use of about 28.0 standard drinks of alcohol per week. He reports that he does not use drugs.  Physical Exam: BP (!) 150/72   Pulse 99   Ht 5' 7 (1.702 m)   Wt 178 lb 3.2 oz (80.8 kg)   BMI 27.91 kg/m   Constitutional:  Alert and oriented, no acute distress, nontoxic appearing HEENT: [place], AT Cardiovascular: No clubbing, cyanosis, or edema Respiratory: Normal respiratory effort, no increased work of breathing Skin: No rashes, bruises or suspicious lesions Neurologic: Grossly intact, no focal deficits, moving all 4 extremities Psychiatric: Normal mood and affect  Laboratory Data: Results for orders placed or performed in visit on 12/14/24  Bladder Scan (Post Void Residual) in office   Collection Time: 12/14/24  9:39 AM  Result Value Ref Range   Scan Result 33ml    Bladder Irrigation  Due to clot retention patient is present today for a bladder irrigation. Patient was cleaned and prepped in a sterile fashion. 1000 mL of sterile water was instilled into the bladder with a 70mL To[place]y syringe through the catheter in place.  of urine return was cleared from the bladder with evacuation of 10ccs of clot material. Efflux cleared from maroon to clear with the procedure and the catheter irrigated easily. Upon completion, the catheter was draining well and was reattached to the leg bag for drainage. Patient tolerated well.   Performed by: Abdelaziz Westenberger, PA-C   Assessment & Plan:   1. Clot retention of urine (Primary) His catheter was obstructed on arrival today due to clot material.  I irrigated his bladder as above and cleared about 10 cc of  clots.  His urine cleared significantly with irrigation and there was no evidence of significant active bleeding.  Will plan to keep Foley in place and see  him back in a week for Eligard (previously scheduled) and voiding trial.  I demonstrated proper irrigation technique in clinic and sent him home with supplies so they can irrigate his catheter if it gets obstructed again, though given the amount of clearance of his urine in clinic today, I do not anticipate this will be necessary. - Bladder Scan (Post Void Residual) in office  Return in about 1 week (around 12/21/2024) for Voiding trial + Eligard.  Lucie Hones, PA-C  [facility] [address] [place], [place] [address] [phone]     [1]  Allergies Allergen Reactions   Bee Venom Anaphylaxis    Has EPI pen for this   Cat Dander Other (See Comments)    Per pt his nose runs/congestion.    Clonidine Other (See Comments)    fatigue   Grass Pollen(K-O-R-T-Swt Vern) Other (See Comments)    Runny nose   "

## 2024-12-14 NOTE — Telephone Encounter (Signed)
 Pts wife called the triage line stating the pt had a UroLift procedure yesterday. He went to the ER last night and had a catheter placed. The patient continues to complain of urinary retention. Spoke with Clotilda, who stated the patient may need possible irrigation. The patient was scheduled to come in to be seen by a PA. Patients wife voiced understanding.

## 2024-12-15 ENCOUNTER — Telehealth: Payer: Self-pay

## 2024-12-15 LAB — URINE CULTURE: Culture: NO GROWTH

## 2024-12-15 NOTE — Telephone Encounter (Signed)
 Pts wife Adrian Lewis on triage line late yesterday afternoon with issues with catheter/blood clots.  Was seen in the ED- Cath replaced.   Called pt and wife to f/u.   Pt had only been up for about 30 minutes. Unsure if cath was working properly. Pt did feel weak and is having pressure. NO fever or pain.   Advised him to push fluids make sure leg bag was hanging lower. Pt did state he had a bm and feels somewhat relieved.  Called pt back a few hours later he was drinking fluids, cath was draining properly - urine was clear. Per wife all ok.   Advised to contact office cath isnt draining, leaking, blood clots in urine, fever or pain not relived with tylenol . Wife voiced understanding.

## 2024-12-16 ENCOUNTER — Ambulatory Visit
Admission: RE | Admit: 2024-12-16 | Discharge: 2024-12-16 | Disposition: A | Discharge status: Home / Self Care | Source: Ambulatory Visit | Attending: Radiation Oncology | Admitting: Radiation Oncology

## 2024-12-17 ENCOUNTER — Telehealth: Payer: Self-pay

## 2024-12-17 NOTE — Telephone Encounter (Signed)
 Called pt and wife she states that the patient is concerned there may be infection at the tip of his penis due to infection.  Secondly pt and wife are concerned by patient's lingering post op fatigue.  Pt denies any purulent drainage from the penis, denies redness at the end of penis. Advised pt that based on his description of sx this does not sound like infection. Explained that catheters do cause discomfort as it is a foreign body. Reiterated this use of Orajel for relief and keeping the catheter off tension. Pt voiced understanding.   Advised pt that post op fatigue recovery varies greatly based on factors such as age. Pt denies any sx of infection including fever, chills, and pain. Pt is eating and drinking well. Advised on continued rest and patience.

## 2024-12-21 ENCOUNTER — Ambulatory Visit: Admitting: Physician Assistant

## 2024-12-21 VITALS — BP 142/79 | HR 88

## 2024-12-21 DIAGNOSIS — R310 Gross hematuria: Secondary | ICD-10-CM

## 2024-12-21 DIAGNOSIS — C61 Malignant neoplasm of prostate: Secondary | ICD-10-CM

## 2024-12-21 DIAGNOSIS — N138 Other obstructive and reflux uropathy: Secondary | ICD-10-CM | POA: Diagnosis not present

## 2024-12-21 DIAGNOSIS — N401 Enlarged prostate with lower urinary tract symptoms: Secondary | ICD-10-CM | POA: Diagnosis not present

## 2024-12-21 LAB — BLADDER SCAN AMB NON-IMAGING: Scan Result: 261

## 2024-12-21 MED ORDER — AMOXICILLIN 500 MG PO TABS: 500.0000 mg | ORAL_TABLET | Freq: Two times a day (BID) | ORAL | 0 refills | Status: AC

## 2024-12-21 MED ORDER — LEUPROLIDE ACETATE (6 MONTH) 45 MG ~~LOC~~ KIT
45.0000 mg | PACK | Freq: Once | SUBCUTANEOUS | Status: AC
  Administered 2024-12-21: 45 mg via SUBCUTANEOUS

## 2024-12-21 NOTE — Progress Notes (Signed)
 Eligard  SubQ Injection   Due to Prostate Cancer patient is present today for a Eligard  Injection.  Medication: Eligard  6 month Dose: 45 mg  Location: left upper outer buttocks Lot: [id number] Exp: 06/23/2025 [id number]  Patient tolerated well, no complications were noted  Performed by: Florrie Gobble, CMA  Per Dr. Francisca patient is to continue therapy for 6 months . One time dose and information given to patient today along with reminder continue on Vitamin D  800-1000iu and Calcium 1000-1200mg  daily while on Androgen Deprivation Therapy.  PA approval dates:  12/10/2024-12/25/2025

## 2024-12-21 NOTE — Progress Notes (Signed)
 Patient presented to clinic today for initiation of 6 months of ADT with Eligard  per Drs. Sninsky and Chrystal.   Patient reports history of bipolar 2 and anxiety managed by Dr. Eappen.  He stays active by going to the [facility] and working with a trainer multiple times a week.   We discussed the anticipated side effects of ADT today including hot flashes, decreased libido, erectile dysfunction, breast tenderness or size changes, fatigue, weight gain, bone loss, muscle mass loss, brain fog, increased cholesterol, increased blood pressure, increased blood sugar, and increased risk for stroke and heart attack.  I counseled him to maintain a healthy diet and continue regular exercise including weightbearing exercise to mitigate bone and muscle loss for the duration of therapy.  Additionally, I counseled him to start daily calcium 1000-1200mg  and vitamin D  800-1000IU supplements to reduce bone loss.  Lastly, I encouraged him to maintain regular follow-ups with his PCP and psychiatrist, and I contacted both of these to make them aware of his ADT in case he will require any regimen changes. He expressed understanding, all questions answered. Printed resources on ADT provided today.  Keyly Baldonado, PA-C 12/21/24 10:49 AM  I spent 20 minutes on the day of the encounter to include pre-visit record review, face-to-face time with the patient, and post-visit ordering of tests.

## 2024-12-21 NOTE — Patient Instructions (Addendum)
 Please take the following dietary supplements for the duration of your hormone suppression therapy to reduce your risk for bone loss: -Calcium 1000-1200mg  daily -Vitamin D  800-1000IU daily   Congratulations on your recent Urolift procedure! As discussed in clinic today, some common postoperative findings include: Burning or pain with urination Lower abdominal/pelvic pain Blood in the urine Urinary leakage Typically, these are mild and resolve within 2-4 weeks of surgery. If you have any concerns, please contact our office.

## 2024-12-21 NOTE — Progress Notes (Signed)
 Afternoon follow-up  Patient returned to clinic this afternoon for PVR. He has voided twice. PVR .  Results for orders placed or performed in visit on 12/21/24  BLADDER SCAN AMB NON-IMAGING   Collection Time: 12/21/24  3:35 PM  Result Value Ref Range   Scan Result 261     Voiding trial passed. Counseled patient to return to clinic if he develops difficulty voiding or bladder discomfort.  I sent in amoxicillin  500 mg twice daily x 3 days for UTI prevention given manipulation/irrigation of his catheter postop.  I also advised him to continue alfuzosin  pending follow-up with Dr. Francisca next month.  He reports persistent fatigue postop.  I offered him H&H for reassurance, though I was honest that I would be surprised if his hematuria were significant enough to cause acute blood loss anemia.  Notably, his hematuria has resolved since catheter removal.  Will contact him with results tomorrow when I receive them.  Follow up: Return for Keep follow-up as scheduled.

## 2024-12-22 ENCOUNTER — Ambulatory Visit: Payer: Self-pay | Admitting: Physician Assistant

## 2024-12-22 LAB — HEMOGLOBIN AND HEMATOCRIT, BLOOD
Hematocrit: 41.9 % (ref 37.5–51.0)
Hemoglobin: 14.2 g/dL (ref 13.0–17.7)

## 2024-12-24 ENCOUNTER — Ambulatory Visit: Payer: Self-pay | Admitting: Internal Medicine

## 2024-12-24 NOTE — Telephone Encounter (Signed)
 FYI Only or Action Required?: Action required by provider: clinical question for provider.  Patient is followed in Pulmonology for sleep apnea, last seen on 10/14/2024 by Isaiah Scrivener, MD.  Called Nurse Triage reporting Advice Only.  Symptoms began N/A.  Interventions attempted: Other: N/A.  Symptoms are: N/A.  Triage Disposition: Call PCP When Office is Open  Patient/caregiver understands and will follow disposition?: Yes                                  Patient called to report issues with his [employer] AirSense 10 machine. Patient reported the cover for the air filter will not stay on and the water tank is not fitting securely, causing air to leak. This RN advised patient to call DME company and provided patient with contact number. Patient denied new or worsening symptoms, other than waking up 1-2 x per night to fill water tank. Patient stated that provider had recently mentioned that a new machine could be prescribed when the time comes, but patient is unsure if 3.5 years is within that timeframe. Please advise.   Reason for Disposition  [1] Caller requesting NON-URGENT health information AND [2] PCP's office is the best resource  Protocols used: Information Only Call - No Triage-A-AH  Reason for Triage:Device Malfunction -  patient reports recent problems with bipap machine - he states that he started having issues last night, the dust filter has a plastic cover and it will not stay in place causing improper filtration , and he also states the tank is not fitting properly in machine causing leakage - pt states he needs a new machine

## 2024-12-24 NOTE — Telephone Encounter (Signed)
 I spoke with the patient. He will reach out to [employer] about fixing his machine. He is aware that insurance will only pay for a new machine every 5 years.   Nothing further needed.

## 2024-12-27 ENCOUNTER — Ambulatory Visit

## 2024-12-28 ENCOUNTER — Ambulatory Visit

## 2024-12-28 ENCOUNTER — Ambulatory Visit: Admitting: Psychiatry

## 2024-12-29 ENCOUNTER — Ambulatory Visit

## 2024-12-30 ENCOUNTER — Ambulatory Visit

## 2024-12-31 ENCOUNTER — Ambulatory Visit

## 2024-12-31 ENCOUNTER — Ambulatory Visit: Admission: RE | Admit: 2024-12-31

## 2025-01-03 ENCOUNTER — Ambulatory Visit

## 2025-01-04 ENCOUNTER — Ambulatory Visit

## 2025-01-05 ENCOUNTER — Ambulatory Visit

## 2025-01-06 ENCOUNTER — Ambulatory Visit

## 2025-01-07 ENCOUNTER — Ambulatory Visit

## 2025-01-10 ENCOUNTER — Ambulatory Visit

## 2025-01-11 ENCOUNTER — Ambulatory Visit

## 2025-01-12 ENCOUNTER — Ambulatory Visit

## 2025-01-13 ENCOUNTER — Ambulatory Visit

## 2025-01-14 ENCOUNTER — Ambulatory Visit

## 2025-01-17 ENCOUNTER — Ambulatory Visit

## 2025-01-18 ENCOUNTER — Ambulatory Visit: Admitting: Urology

## 2025-01-18 ENCOUNTER — Ambulatory Visit

## 2025-01-19 ENCOUNTER — Ambulatory Visit

## 2025-01-20 ENCOUNTER — Ambulatory Visit

## 2025-01-21 ENCOUNTER — Ambulatory Visit

## 2025-01-24 ENCOUNTER — Ambulatory Visit

## 2025-01-25 ENCOUNTER — Ambulatory Visit

## 2025-01-26 ENCOUNTER — Ambulatory Visit

## 2025-01-27 ENCOUNTER — Ambulatory Visit

## 2025-01-28 ENCOUNTER — Ambulatory Visit

## 2025-01-31 ENCOUNTER — Ambulatory Visit

## 2025-02-01 ENCOUNTER — Ambulatory Visit

## 2025-02-02 ENCOUNTER — Ambulatory Visit

## 2025-02-03 ENCOUNTER — Ambulatory Visit

## 2025-02-03 ENCOUNTER — Ambulatory Visit: Admitting: Psychiatry

## 2025-02-04 ENCOUNTER — Ambulatory Visit

## 2025-02-07 ENCOUNTER — Ambulatory Visit

## 2025-02-08 ENCOUNTER — Ambulatory Visit

## 2025-02-09 ENCOUNTER — Ambulatory Visit

## 2025-02-10 ENCOUNTER — Ambulatory Visit

## 2025-02-11 ENCOUNTER — Ambulatory Visit

## 2025-02-14 ENCOUNTER — Ambulatory Visit

## 2025-02-15 ENCOUNTER — Ambulatory Visit

## 2025-02-16 ENCOUNTER — Ambulatory Visit

## 2025-02-17 ENCOUNTER — Ambulatory Visit

## 2025-02-18 ENCOUNTER — Ambulatory Visit

## 2025-02-21 ENCOUNTER — Ambulatory Visit

## 2025-02-22 ENCOUNTER — Ambulatory Visit

## 2025-02-23 ENCOUNTER — Ambulatory Visit

## 2025-02-24 ENCOUNTER — Ambulatory Visit

## 2025-02-25 ENCOUNTER — Ambulatory Visit

## 2025-02-28 ENCOUNTER — Ambulatory Visit

## 2025-03-01 ENCOUNTER — Ambulatory Visit

## 2025-03-02 ENCOUNTER — Ambulatory Visit

## 2025-03-03 ENCOUNTER — Ambulatory Visit

## 2025-03-04 ENCOUNTER — Ambulatory Visit

## 2025-03-07 ENCOUNTER — Ambulatory Visit

## 2025-06-01 ENCOUNTER — Ambulatory Visit: Admitting: Physician Assistant

## 2025-06-09 ENCOUNTER — Ambulatory Visit: Admitting: Physician Assistant
